# Patient Record
Sex: Male | Born: 1985 | Race: Black or African American | Hispanic: No | Marital: Single | Smoking: Never smoker
Health system: Southern US, Community
[De-identification: ages and names within clinical notes are randomized; demographics above are authoritative.]

## PROBLEM LIST (undated history)

## (undated) ENCOUNTER — Ambulatory Visit (HOSPITAL_COMMUNITY): Payer: Medicaid Other

## (undated) ENCOUNTER — Ambulatory Visit (HOSPITAL_COMMUNITY): Admission: EM | Payer: Self-pay

## (undated) ENCOUNTER — Emergency Department (HOSPITAL_BASED_OUTPATIENT_CLINIC_OR_DEPARTMENT_OTHER): Payer: BC Managed Care – PPO | Source: Home / Self Care

## (undated) DIAGNOSIS — F419 Anxiety disorder, unspecified: Secondary | ICD-10-CM

## (undated) DIAGNOSIS — F32A Depression, unspecified: Secondary | ICD-10-CM

## (undated) DIAGNOSIS — I10 Essential (primary) hypertension: Secondary | ICD-10-CM

## (undated) DIAGNOSIS — B009 Herpesviral infection, unspecified: Secondary | ICD-10-CM

## (undated) DIAGNOSIS — J45909 Unspecified asthma, uncomplicated: Secondary | ICD-10-CM

## (undated) HISTORY — DX: Depression, unspecified: F32.A

## (undated) HISTORY — DX: Anxiety disorder, unspecified: F41.9

## (undated) HISTORY — DX: Unspecified asthma, uncomplicated: J45.909

## (undated) HISTORY — PX: FRACTURE SURGERY: SHX138

---
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
## 2007-04-06 ENCOUNTER — Encounter: Admission: RE | Admit: 2007-04-06 | Discharge: 2007-04-06 | Payer: Self-pay | Admitting: Emergency Medicine

## 2007-05-02 ENCOUNTER — Ambulatory Visit: Payer: Self-pay | Admitting: Gastroenterology

## 2007-05-02 LAB — CONVERTED CEMR LAB
ALT: 19 units/L (ref 0–53)
AST: 19 units/L (ref 0–37)
Albumin: 4.3 g/dL (ref 3.5–5.2)
Alkaline Phosphatase: 70 units/L (ref 39–117)
BUN: 10 mg/dL (ref 6–23)
Basophils Absolute: 0.3 10*3/uL — ABNORMAL HIGH (ref 0.0–0.1)
Basophils Relative: 4.4 % — ABNORMAL HIGH (ref 0.0–1.0)
Bilirubin, Direct: 0.3 mg/dL (ref 0.0–0.3)
CO2: 31 meq/L (ref 19–32)
Calcium: 9.6 mg/dL (ref 8.4–10.5)
Chloride: 104 meq/L (ref 96–112)
Creatinine, Ser: 1 mg/dL (ref 0.4–1.5)
Eosinophils Absolute: 0.2 10*3/uL (ref 0.0–0.6)
Eosinophils Relative: 3.6 % (ref 0.0–5.0)
GFR calc Af Amer: 121 mL/min
GFR calc non Af Amer: 100 mL/min
Glucose, Bld: 84 mg/dL (ref 70–99)
HCT: 48.4 % (ref 39.0–52.0)
Hemoglobin: 15.9 g/dL (ref 13.0–17.0)
Lymphocytes Relative: 26.5 % (ref 12.0–46.0)
MCHC: 32.8 g/dL (ref 30.0–36.0)
MCV: 91.9 fL (ref 78.0–100.0)
Monocytes Absolute: 0.4 10*3/uL (ref 0.2–0.7)
Monocytes Relative: 7.2 % (ref 3.0–11.0)
Neutro Abs: 3.7 10*3/uL (ref 1.4–7.7)
Neutrophils Relative %: 58.3 % (ref 43.0–77.0)
Platelets: 238 10*3/uL (ref 150–400)
Potassium: 4.1 meq/L (ref 3.5–5.1)
RBC: 5.26 M/uL (ref 4.22–5.81)
RDW: 12.6 % (ref 11.5–14.6)
Sodium: 141 meq/L (ref 135–145)
TSH: 1.52 microintl units/mL (ref 0.35–5.50)
Total Bilirubin: 1.5 mg/dL — ABNORMAL HIGH (ref 0.3–1.2)
Total Protein: 7 g/dL (ref 6.0–8.3)
WBC: 6.2 10*3/uL (ref 4.5–10.5)

## 2007-05-25 ENCOUNTER — Ambulatory Visit: Payer: Self-pay | Admitting: Gastroenterology

## 2007-06-22 ENCOUNTER — Telehealth: Payer: Self-pay | Admitting: Gastroenterology

## 2007-06-30 DIAGNOSIS — K5900 Constipation, unspecified: Secondary | ICD-10-CM | POA: Insufficient documentation

## 2007-07-05 ENCOUNTER — Ambulatory Visit: Payer: Self-pay | Admitting: Gastroenterology

## 2008-04-21 ENCOUNTER — Emergency Department (HOSPITAL_COMMUNITY): Admission: EM | Admit: 2008-04-21 | Discharge: 2008-04-21 | Payer: Self-pay | Admitting: Emergency Medicine

## 2008-04-29 ENCOUNTER — Emergency Department (HOSPITAL_COMMUNITY): Admission: EM | Admit: 2008-04-29 | Discharge: 2008-04-29 | Payer: Self-pay | Admitting: Emergency Medicine

## 2008-06-19 ENCOUNTER — Emergency Department (HOSPITAL_COMMUNITY): Admission: EM | Admit: 2008-06-19 | Discharge: 2008-06-19 | Payer: Self-pay | Admitting: Family Medicine

## 2008-12-04 ENCOUNTER — Emergency Department (HOSPITAL_COMMUNITY): Admission: EM | Admit: 2008-12-04 | Discharge: 2008-12-05 | Payer: Self-pay | Admitting: Emergency Medicine

## 2009-08-10 ENCOUNTER — Emergency Department (HOSPITAL_COMMUNITY): Admission: EM | Admit: 2009-08-10 | Discharge: 2009-08-10 | Payer: Self-pay | Admitting: Emergency Medicine

## 2009-08-11 ENCOUNTER — Emergency Department (HOSPITAL_COMMUNITY): Admission: EM | Admit: 2009-08-11 | Discharge: 2009-08-11 | Payer: Self-pay | Admitting: Emergency Medicine

## 2009-12-30 ENCOUNTER — Emergency Department (HOSPITAL_COMMUNITY): Admission: EM | Admit: 2009-12-30 | Discharge: 2009-12-30 | Payer: Self-pay | Admitting: Emergency Medicine

## 2010-05-22 LAB — URINALYSIS, ROUTINE W REFLEX MICROSCOPIC
Bilirubin Urine: NEGATIVE
Glucose, UA: NEGATIVE mg/dL
Hgb urine dipstick: NEGATIVE
Ketones, ur: NEGATIVE mg/dL
Ketones, ur: NEGATIVE mg/dL
Nitrite: NEGATIVE
Nitrite: NEGATIVE
Protein, ur: NEGATIVE mg/dL
Protein, ur: NEGATIVE mg/dL
Specific Gravity, Urine: 1.027 (ref 1.005–1.030)
Urobilinogen, UA: 0.2 mg/dL (ref 0.0–1.0)
pH: 6 (ref 5.0–8.0)
pH: 7.5 (ref 5.0–8.0)

## 2010-05-22 LAB — GLUCOSE, CAPILLARY: Glucose-Capillary: 98 mg/dL (ref 70–99)

## 2010-06-13 ENCOUNTER — Emergency Department (HOSPITAL_COMMUNITY)
Admission: EM | Admit: 2010-06-13 | Discharge: 2010-06-13 | Disposition: A | Discharge status: Home / Self Care | Payer: Self-pay | Attending: Emergency Medicine | Admitting: Emergency Medicine

## 2010-06-13 DIAGNOSIS — R109 Unspecified abdominal pain: Secondary | ICD-10-CM | POA: Insufficient documentation

## 2010-06-13 LAB — POCT I-STAT, CHEM 8
Creatinine, Ser: 1.3 mg/dL (ref 0.4–1.5)
Glucose, Bld: 81 mg/dL (ref 70–99)
Hemoglobin: 16 g/dL (ref 13.0–17.0)
Sodium: 140 mEq/L (ref 135–145)
TCO2: 28 mmol/L (ref 0–100)

## 2010-06-13 LAB — COMPREHENSIVE METABOLIC PANEL
BUN: 14 mg/dL (ref 6–23)
Calcium: 9.4 mg/dL (ref 8.4–10.5)
Glucose, Bld: 81 mg/dL (ref 70–99)
Total Protein: 6.9 g/dL (ref 6.0–8.3)

## 2010-06-24 NOTE — Assessment & Plan Note (Signed)
[facility]                         GASTROENTEROLOGY OFFICE NOTE   Danny Mccoy, Danny Mccoy                      MRN:          [id number]  DATE:05/02/2007                            DOB:          September 19, 1985    REFERRING PHYSICIAN:  Janetta Hora. Hulan Saas, M.D.   REASON FOR CONSULTATION:  Dr. Hulan Saas asked me to evaluate Danny Mccoy in  consultation regarding prior constipation, intermittent rectal bleeding.   HISTORY OF PRESENT ILLNESS:  Danny Mccoy is a very pleasant 25 year old  man who has had approximately 1 year of constipation.  He remembers it  started while he was at work one day.  He had to really push and strain  to move his bowels and he ended up not having a bowel movement that day.  Ever since then he has been really plagued with chronic constipations.  Prior to that he was moving his bowels once or twice a day very easily,  but since then he will move his bowels twice a week at most and really  has to strain a lot in between then.  He describes rather scybalous  stools.  He also describes intermittent rectal bleeding.  He tried a  variety of different regimens including Citrucel, over-the-counter  laxatives, Lactulose, milk of magnesia, mag citrate, none of which  really gave him much relief.  He cannot point to any specific dietary  changes, exercise changes or new medications a year ago that would  account for anything such as this.   REVIEW OF SYSTEMS:  Notable for probably 15 pound weight loss over the  past year and is otherwise essentially normal unless noted on intake  sheet.   PAST MEDICAL HISTORY:  None.   CURRENT MEDICINES:  None.   DRUG ALLERGIES:  NONE.   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  Drinks alcohol rarely.  Does not  drink much caffeine.  Works as a Child psychotherapist.   FAMILY HISTORY:  Hemorrhoids run in his family.  Diabetes runs in the  family.  No  colon cancer.   PHYSICAL EXAMINATION:  VITAL SIGNS:  5 feet 10 inches, 180 pounds.  Blood pressure 116/68.  Pulse 60.  CONSTITUTIONAL:  Generally well appearing.  NEUROLOGIC:  Alert and oriented x3.  HEENT:  Eyes, extraocular movements intact.  Mouth, oropharynx moist, no  lesions.  NECK:  Supple, no lymphadenopathy.  CARDIOVASCULAR:  Heart, regular rate and rhythm.  LUNGS:  Clear to auscultation bilaterally.  ABDOMEN:  Soft, nontender, nondistended, normal bowel sounds.  EXTREMITIES:  No lower extremity edema.  SKIN:  No rashes or lesions on visible extremities.  RECTAL:  Deferred for upcoming colonoscopy.   ASSESSMENT AND PLAN:  A 25 year old man with constipation for  approximately 1 year, intermittent rectal bleeding, mild weight loss.   Given his weight loss and his bleeding, I think we should definitely  proceed with full colonoscopy at his soonest convenience.  Overall, I  think it is unlikely that he has a neoplastic cause of this, but we  should  absolutely rule that out.  He will also get a basic set of labs,  including a CBC, complete metabolic profile and thyroid testing.  I  would like to wait to do the colonoscopy for 2-3 weeks from now, and in  the meantime I would like to get him feeling better with MiraLax.  I  think that he will probably get a lot of relief with 4 scoops of MiraLax  today, 4 tomorrow, and then starting on a daily regimen of 2 scoops  every day.  Other than that I see no reason for any further blood test  for imaging studies.     Rachael Fee, MD  Electronically Signed    DPJ/MedQ  DD: 05/02/2007  DT: 05/02/2007  Job #: [id number]   cc:   Janetta Hora. Hulan Saas, M.D.

## 2010-07-19 ENCOUNTER — Inpatient Hospital Stay (INDEPENDENT_AMBULATORY_CARE_PROVIDER_SITE_OTHER)
Admission: RE | Admit: 2010-07-19 | Discharge: 2010-07-19 | Disposition: A | Discharge status: Home / Self Care | Payer: Self-pay | Source: Ambulatory Visit | Attending: Emergency Medicine | Admitting: Emergency Medicine

## 2010-07-19 DIAGNOSIS — L988 Other specified disorders of the skin and subcutaneous tissue: Secondary | ICD-10-CM

## 2010-07-21 LAB — HERPES SIMPLEX VIRUS CULTURE

## 2011-06-21 ENCOUNTER — Encounter (HOSPITAL_COMMUNITY): Payer: Self-pay | Admitting: *Deleted

## 2011-06-21 ENCOUNTER — Emergency Department (HOSPITAL_COMMUNITY)
Admission: EM | Admit: 2011-06-21 | Discharge: 2011-06-21 | Disposition: A | Discharge status: Home / Self Care | Payer: Self-pay | Attending: Emergency Medicine | Admitting: Emergency Medicine

## 2011-06-21 DIAGNOSIS — L299 Pruritus, unspecified: Secondary | ICD-10-CM | POA: Insufficient documentation

## 2011-06-21 DIAGNOSIS — L309 Dermatitis, unspecified: Secondary | ICD-10-CM

## 2011-06-21 DIAGNOSIS — L509 Urticaria, unspecified: Secondary | ICD-10-CM | POA: Insufficient documentation

## 2011-06-21 DIAGNOSIS — L259 Unspecified contact dermatitis, unspecified cause: Secondary | ICD-10-CM | POA: Insufficient documentation

## 2011-06-21 MED ORDER — DIPHENHYDRAMINE HCL 25 MG PO CAPS
50.0000 mg | ORAL_CAPSULE | Freq: Once | ORAL | Status: AC
  Administered 2011-06-21: 50 mg via ORAL
  Filled 2011-06-21: qty 2

## 2011-06-21 MED ORDER — PREDNISONE 20 MG PO TABS
60.0000 mg | ORAL_TABLET | Freq: Once | ORAL | Status: AC
  Administered 2011-06-21: 60 mg via ORAL
  Filled 2011-06-21: qty 3

## 2011-06-21 MED ORDER — PREDNISONE 10 MG PO TABS: 20.0000 mg | ORAL_TABLET | Freq: Every day | ORAL | Status: DC

## 2011-06-21 NOTE — Discharge Instructions (Signed)
Use adult benadryl as directed Contact Dermatitis Contact dermatitis is a reaction to certain substances that touch the skin. Contact dermatitis can be either irritant contact dermatitis or allergic contact dermatitis. Irritant contact dermatitis does not require previous exposure to the substance for a reaction to occur.Allergic contact dermatitis only occurs if you have been exposed to the substance before. Upon a repeat exposure, your body reacts to the substance.  CAUSES  Many substances can cause contact dermatitis. Irritant dermatitis is most commonly caused by repeated exposure to mildly irritating substances, such as:  Makeup.   Soaps.   Detergents.   Bleaches.   Acids.   Metal salts, such as nickel.  Allergic contact dermatitis is most commonly caused by exposure to:  Poisonous plants.   Chemicals (deodorants, shampoos).   Jewelry.   Latex.   Neomycin in triple antibiotic cream.   Preservatives in products, including clothing.  SYMPTOMS  The area of skin that is exposed may develop:  Dryness or flaking.   Redness.   Cracks.   Itching.   Pain or a burning sensation.   Blisters.  With allergic contact dermatitis, there may also be swelling in areas such as the eyelids, mouth, or genitals.  DIAGNOSIS  Your caregiver can usually tell what the problem is by doing a physical exam. In cases where the cause is uncertain and an allergic contact dermatitis is suspected, a patch skin test may be performed to help determine the cause of your dermatitis. TREATMENT Treatment includes protecting the skin from further contact with the irritating substance by avoiding that substance if possible. Barrier creams, powders, and gloves may be helpful. Your caregiver may also recommend:  Steroid creams or ointments applied 2 times daily. For best results, soak the rash area in cool water for 20 minutes. Then apply the medicine. Cover the area with a plastic wrap. You can store  the steroid cream in the refrigerator for a "chilly" effect on your rash. That may decrease itching. Oral steroid medicines may be needed in more severe cases.   Antibiotics or antibacterial ointments if a skin infection is present.   Antihistamine lotion or an antihistamine taken by mouth to ease itching.   Lubricants to keep moisture in your skin.   Burow's solution to reduce redness and soreness or to dry a weeping rash. Mix one packet or tablet of solution in 2 cups cool water. Dip a clean washcloth in the mixture, wring it out a bit, and put it on the affected area. Leave the cloth in place for 30 minutes. Do this as often as possible throughout the day.   Taking several cornstarch or baking soda baths daily if the area is too large to cover with a washcloth.  Harsh chemicals, such as alkalis or acids, can cause skin damage that is like a burn. You should flush your skin for 15 to 20 minutes with cold water after such an exposure. You should also seek immediate medical care after exposure. Bandages (dressings), antibiotics, and pain medicine may be needed for severely irritated skin.  HOME CARE INSTRUCTIONS  Avoid the substance that caused your reaction.   Keep the area of skin that is affected away from hot water, soap, sunlight, chemicals, acidic substances, or anything else that would irritate your skin.   Do not scratch the rash. Scratching may cause the rash to become infected.   You may take cool baths to help stop the itching.   Only take over-the-counter or prescription medicines as directed  by your caregiver.   See your caregiver for follow-up care as directed to make sure your skin is healing properly.  SEEK MEDICAL CARE IF:   Your condition is not better after 3 days of treatment.   You seem to be getting worse.   You see signs of infection such as swelling, tenderness, redness, soreness, or warmth in the affected area.   You have any problems related to your  medicines.  Document Released: 01/24/2000 Document Revised: 01/15/2011 Document Reviewed: 07/01/2010 [facility] Patient Information 2012 [place], [place].

## 2011-06-21 NOTE — ED Provider Notes (Signed)
History     CSN: [id number]  Arrival date & time 06/21/11  4098   First MD Initiated Contact with Patient 06/21/11 [phone]      Chief Complaint  Patient presents with  . Rash    (Consider location/radiation/quality/duration/timing/severity/associated sxs/prior treatment) Patient is a 26 y.o. male presenting with rash. The history is provided by the patient.  Rash  This is a new problem. The current episode started 12 to 24 hours ago. The problem has been gradually worsening. The problem is associated with nothing. There has been no fever. The rash is present on the face. The patient is experiencing no pain. Associated symptoms include itching. Pertinent negatives include no pain. He has tried antihistamines for the symptoms. The treatment provided no relief.    History reviewed. No pertinent past medical history.  History reviewed. No pertinent past surgical history.  History reviewed. No pertinent family history.  History  Substance Use Topics  . Smoking status: Not on file  . Smokeless tobacco: Not on file  . Alcohol Use: Yes     occ      Review of Systems  Skin: Positive for itching and rash.  All other systems reviewed and are negative.    Allergies  Review of patient's allergies indicates no known allergies.  Home Medications   Current Outpatient Rx  Name Route Sig Dispense Refill  . DIPHENHYDRAMINE HCL 12.5 MG/5ML PO LIQD Oral Take 25 mg by mouth 4 (four) times daily as needed. For itching    . HYDROCORTISONE 1 % EX CREA Topical Apply 1 application topically 2 (two) times daily as needed. For itching      BP 116/70  Pulse 70  Temp(Src) 98.2 F (36.8 C) (Oral)  Resp 20  SpO2 99%  Physical Exam  Nursing note and vitals reviewed. Constitutional: He is oriented to person, place, and time. He appears well-developed and well-nourished.  Non-toxic appearance. No distress.  HENT:  Head: Normocephalic and atraumatic.       Hive like rash to face  Eyes:  Conjunctivae, EOM and lids are normal. Pupils are equal, round, and reactive to light.  Neck: Normal range of motion. Neck supple. No tracheal deviation present. No mass present.  Cardiovascular: Normal rate, regular rhythm and normal heart sounds.  Exam reveals no gallop.   No murmur heard. Pulmonary/Chest: Effort normal and breath sounds normal. No stridor. No respiratory distress. He has no decreased breath sounds. He has no wheezes. He has no rhonchi. He has no rales.  Abdominal: Soft. Normal appearance and bowel sounds are normal. He exhibits no distension. There is no tenderness. There is no rebound and no CVA tenderness.  Musculoskeletal: Normal range of motion. He exhibits no edema and no tenderness.  Neurological: He is alert and oriented to person, place, and time. He has normal strength. No cranial nerve deficit or sensory deficit. GCS eye subscore is 4. GCS verbal subscore is 5. GCS motor subscore is 6.  Skin: Skin is warm and dry. Rash noted. No abrasion noted. Rash is urticarial.  Psychiatric: He has a normal mood and affect. His speech is normal and behavior is normal.    ED Course  Procedures (including critical care time)  Labs Reviewed - No data to display No results found.   No diagnosis found.    MDM  Pt given benadryl and prednisone--suspect contact dermatitis        Toy Baker, MD 06/21/11 [phone]

## 2011-06-21 NOTE — ED Notes (Signed)
Pt reports having rash to face since Friday, causing itching. Airway intact.

## 2011-08-07 ENCOUNTER — Emergency Department (INDEPENDENT_AMBULATORY_CARE_PROVIDER_SITE_OTHER)
Admission: EM | Admit: 2011-08-07 | Discharge: 2011-08-07 | Disposition: A | Discharge status: Home / Self Care | Payer: Self-pay | Source: Home / Self Care | Attending: Family Medicine | Admitting: Family Medicine

## 2011-08-07 ENCOUNTER — Encounter (HOSPITAL_COMMUNITY): Payer: Self-pay

## 2011-08-07 DIAGNOSIS — N342 Other urethritis: Secondary | ICD-10-CM

## 2011-08-07 DIAGNOSIS — J029 Acute pharyngitis, unspecified: Secondary | ICD-10-CM

## 2011-08-07 MED ORDER — LIDOCAINE HCL (PF) 1 % IJ SOLN
INTRAMUSCULAR | Status: AC
  Filled 2011-08-07: qty 5

## 2011-08-07 MED ORDER — AZITHROMYCIN 250 MG PO TABS
1000.0000 mg | ORAL_TABLET | Freq: Once | ORAL | Status: AC
  Administered 2011-08-07: 1000 mg via ORAL

## 2011-08-07 MED ORDER — CEFTRIAXONE SODIUM 1 G IJ SOLR
250.0000 mg | Freq: Once | INTRAMUSCULAR | Status: AC
  Administered 2011-08-07: 250 mg via INTRAMUSCULAR

## 2011-08-07 MED ORDER — AZITHROMYCIN 250 MG PO TABS
ORAL_TABLET | ORAL | Status: AC
  Filled 2011-08-07: qty 4

## 2011-08-07 MED ORDER — CEFTRIAXONE SODIUM 250 MG IJ SOLR
INTRAMUSCULAR | Status: AC
  Filled 2011-08-07: qty 250

## 2011-08-07 NOTE — ED Provider Notes (Signed)
History     CSN: [id number]  Arrival date & time 08/07/11  1128   First MD Initiated Contact with Patient 08/07/11 1203      Chief Complaint  Patient presents with  . SEXUALLY TRANSMITTED DISEASE    (Consider location/radiation/quality/duration/timing/severity/associated sxs/prior treatment) Patient is a 26 y.o. male presenting with STD exposure. The history is provided by the patient.  Exposure to STD This is a new problem. The current episode started more than 1 week ago (urethral d/c). The problem has been gradually worsening.    History reviewed. No pertinent past medical history.  History reviewed. No pertinent past surgical history.  History reviewed. No pertinent family history.  History  Substance Use Topics  . Smoking status: Not on file  . Smokeless tobacco: Not on file  . Alcohol Use: Yes     occ      Review of Systems  Constitutional: Negative.   HENT: Positive for sore throat.   Genitourinary: Positive for discharge. Negative for penile swelling and penile pain.    Allergies  Review of patient's allergies indicates no known allergies.  Home Medications   Current Outpatient Rx  Name Route Sig Dispense Refill  . DIPHENHYDRAMINE HCL 12.5 MG/5ML PO LIQD Oral Take 25 mg by mouth 4 (four) times daily as needed. For itching    . HYDROCORTISONE 1 % EX CREA Topical Apply 1 application topically 2 (two) times daily as needed. For itching    . PREDNISONE 10 MG PO TABS Oral Take 2 tablets (20 mg total) by mouth daily. 10 tablet 0    BP 134/83  Pulse 59  Temp 98.2 F (36.8 C) (Oral)  Resp 12  SpO2 98%  Physical Exam  Nursing note and vitals reviewed. Constitutional: He is oriented to person, place, and time. He appears well-developed and well-nourished.  HENT:  Mouth/Throat: Oropharynx is clear and moist.  Genitourinary: Testes normal and penis normal. Circumcised. No penile tenderness. No discharge found.  Lymphadenopathy:       Right: No inguinal  adenopathy present.       Left: No inguinal adenopathy present.  Neurological: He is alert and oriented to person, place, and time.  Skin: Skin is warm and dry. No rash noted.    ED Course  Procedures (including critical care time)   Labs Reviewed  GC/CHLAMYDIA PROBE AMP, GENITAL   No results found.   1. Urethritis   2. Pharyngitis       MDM          Linna Hoff, MD 08/07/11 1247

## 2011-08-07 NOTE — Discharge Instructions (Signed)
We will call with test results and treat as indicated °

## 2011-08-07 NOTE — ED Notes (Signed)
C/o having urethral d/c for past few days, and ST for past couple of days; REFUSES THROAT SWAB FOR RAPID STREP; c/o "I will die, I cannot do that"; tonsils red, swollen, denies ear pain

## 2011-08-08 LAB — GC/CHLAMYDIA PROBE AMP, GENITAL: GC Probe Amp, Genital: NEGATIVE

## 2011-08-18 ENCOUNTER — Telehealth (HOSPITAL_COMMUNITY): Payer: Self-pay | Admitting: *Deleted

## 2011-08-18 NOTE — ED Notes (Signed)
Pt. called on VM x 2 on 7/8 for lab results. Pt. called again this AM. Pt. verified x 2 and given results. GC/Chlamydia neg.  States we treated him and he got better. He  had unprotected sex with his girlfriend and now has symptoms again.  I instructed to practice safe sex and always wear a condom.  I asked what symptoms. He said tingling in his penis and leaking pus.  I asked him if his girlfriend was checked for Trich.  He said she is not talking to him anymore.  I told him he needed to be rechecked. I told him about the [facility] STD clinic and gave him the address, phone number and hours of operation. Vassie Moselle 08/18/2011

## 2011-08-21 ENCOUNTER — Encounter (HOSPITAL_COMMUNITY): Payer: Self-pay | Admitting: *Deleted

## 2011-08-21 ENCOUNTER — Emergency Department (HOSPITAL_COMMUNITY)
Admission: EM | Admit: 2011-08-21 | Discharge: 2011-08-21 | Disposition: A | Discharge status: Home / Self Care | Payer: Self-pay | Source: Home / Self Care | Attending: Emergency Medicine | Admitting: Emergency Medicine

## 2011-08-21 DIAGNOSIS — R369 Urethral discharge, unspecified: Secondary | ICD-10-CM

## 2011-08-21 LAB — POCT URINALYSIS DIP (DEVICE)
Hgb urine dipstick: NEGATIVE
Protein, ur: NEGATIVE mg/dL
Specific Gravity, Urine: 1.025 (ref 1.005–1.030)
Urobilinogen, UA: 1 mg/dL (ref 0.0–1.0)

## 2011-08-21 MED ORDER — CEFTRIAXONE SODIUM 250 MG IJ SOLR
INTRAMUSCULAR | Status: AC
  Filled 2011-08-21: qty 250

## 2011-08-21 MED ORDER — CEFTRIAXONE SODIUM 250 MG IJ SOLR
250.0000 mg | Freq: Once | INTRAMUSCULAR | Status: AC
  Administered 2011-08-21: 250 mg via INTRAMUSCULAR

## 2011-08-21 MED ORDER — AZITHROMYCIN 250 MG PO TABS
1000.0000 mg | ORAL_TABLET | Freq: Once | ORAL | Status: AC
  Administered 2011-08-21: 1000 mg via ORAL

## 2011-08-21 MED ORDER — AZITHROMYCIN 250 MG PO TABS
ORAL_TABLET | ORAL | Status: AC
  Filled 2011-08-21: qty 4

## 2011-08-21 NOTE — ED Provider Notes (Signed)
History     CSN: [id number]  Arrival date & time 08/21/11  1123   First MD Initiated Contact with Patient 08/21/11 1127      Chief Complaint  Patient presents with  . Penile Discharge    (Consider location/radiation/quality/duration/timing/severity/associated sxs/prior treatment) HPI Comments: Patient presents urgent care complaining of a new lead develop penal discharge and tingling sensations in started Monday. Patient was seen back in June was same symptoms and was treated empirically with DNA probe testing resulting negative. Patient is describing today and needle exposure without protection. Patient describes it in the morning he noticed a minimal amount discharge in his underwear and has some tingling discomfort HEENT sensations when he urinates. Patient denies any fevers, rashes, inguinal pain or swelling. No pelvic pain or rectal pain  Patient is a 26 y.o. male presenting with penile discharge. The history is provided by the patient.  Penile Discharge This is a new problem. The problem has not changed since onset.Pertinent negatives include no chest pain, no headaches and no shortness of breath.    History reviewed. No pertinent past medical history.  History reviewed. No pertinent past surgical history.  No family history on file.  History  Substance Use Topics  . Smoking status: Not on file  . Smokeless tobacco: Not on file  . Alcohol Use: Yes     occ      Review of Systems  Constitutional: Negative for chills, diaphoresis, activity change and appetite change.  Respiratory: Negative for shortness of breath.   Cardiovascular: Negative for chest pain.  Genitourinary: Positive for discharge. Negative for urgency, decreased urine volume, penile swelling, scrotal swelling, genital sores and testicular pain.  Neurological: Negative for dizziness and headaches.    Allergies  Review of patient's allergies indicates no known allergies.  Home Medications   Current  Outpatient Rx  Name Route Sig Dispense Refill  . DIPHENHYDRAMINE HCL 12.5 MG/5ML PO LIQD Oral Take 25 mg by mouth 4 (four) times daily as needed. For itching    . HYDROCORTISONE 1 % EX CREA Topical Apply 1 application topically 2 (two) times daily as needed. For itching    . PREDNISONE 10 MG PO TABS Oral Take 2 tablets (20 mg total) by mouth daily. 10 tablet 0    BP 133/69  Pulse 60  Temp 98.3 F (36.8 C) (Oral)  Resp 16  SpO2 100%  Physical Exam  Nursing note and vitals reviewed. Constitutional: He appears well-developed and well-nourished.  Abdominal: Normal appearance. There is no hepatosplenomegaly. There is no tenderness. There is no CVA tenderness.  Genitourinary: Penis normal.    No phimosis, paraphimosis, hypospadias, penile erythema or penile tenderness. No discharge found.    ED Course  Procedures (including critical care time)  Labs Reviewed  POCT URINALYSIS DIP (DEVICE) - Abnormal; Notable for the following:    Bilirubin Urine SMALL (*)     All other components within normal limits  GC/CHLAMYDIA PROBE AMP, GENITAL   No results found.   1. Penile discharge       MDM  Recurrent penal reports of discharge and tingling sensations. Patient returns with new concerns this started 5 days ago. Reports both penal discharge and tingling sensations. Have obtained another DNA probe for chlamydia and gonorrhea. Previous screening in June was negative for both patient was also treated empirically.        Jimmie Molly, MD 08/21/11 [phone]

## 2011-08-21 NOTE — ED Notes (Signed)
Pt  Was  Seen   Recently  For  Std      According to previous  Entry      - pt  Was  Advised  To  followup  At the  Std  Clinic             He  Apparently       Did  Not     He    Continues  To  Have  Burning on  Urination and  A  Clear  Discharge

## 2011-11-10 ENCOUNTER — Encounter (HOSPITAL_COMMUNITY): Payer: Self-pay | Admitting: *Deleted

## 2011-11-10 ENCOUNTER — Emergency Department (HOSPITAL_COMMUNITY)
Admission: EM | Admit: 2011-11-10 | Discharge: 2011-11-10 | Disposition: A | Discharge status: Home / Self Care | Payer: Self-pay | Attending: Emergency Medicine | Admitting: Emergency Medicine

## 2011-11-10 DIAGNOSIS — K0889 Other specified disorders of teeth and supporting structures: Secondary | ICD-10-CM

## 2011-11-10 DIAGNOSIS — K089 Disorder of teeth and supporting structures, unspecified: Secondary | ICD-10-CM | POA: Insufficient documentation

## 2011-11-10 MED ORDER — HYDROCODONE-ACETAMINOPHEN 5-325 MG PO TABS: 1.0000 | ORAL_TABLET | ORAL | Status: DC | PRN

## 2011-11-10 MED ORDER — IBUPROFEN 200 MG PO TABS
400.0000 mg | ORAL_TABLET | Freq: Once | ORAL | Status: AC
  Administered 2011-11-10: 400 mg via ORAL
  Filled 2011-11-10: qty 2

## 2011-11-10 MED ORDER — PENICILLIN V POTASSIUM 500 MG PO TABS: 500.0000 mg | ORAL_TABLET | Freq: Three times a day (TID) | ORAL | Status: DC

## 2011-11-10 MED ORDER — OXYCODONE-ACETAMINOPHEN 5-325 MG PO TABS
1.0000 | ORAL_TABLET | Freq: Once | ORAL | Status: AC
  Administered 2011-11-10: 1 via ORAL
  Filled 2011-11-10: qty 1

## 2011-11-10 NOTE — ED Notes (Signed)
Verified with Schinliver, PA that the pt was stable & medically cleared to be discharged with admin of Percocet x 3 hrs prior, verbal permission received to discharge pt, pt ambulatory with no complications, pt A&O x 4

## 2011-11-10 NOTE — ED Notes (Signed)
C/o lower L toothache, now developing fever, chills, HA. "tooth feels loose".  sx ongoing for ~ 1 week. Swelling noted to gums. Denies drainage. Took ibuprofen 0900 & tylenol PM last night w/o relief.

## 2011-11-10 NOTE — ED Provider Notes (Signed)
History     CSN: [id number]  Arrival date & time 11/10/11  1931   First MD Initiated Contact with Patient 11/10/11 2149      Chief Complaint  Patient presents with  . Dental Pain    (Consider location/radiation/quality/duration/timing/severity/associated sxs/prior treatment) HPI History provided by pt.   Pt presents w/ severe, non-radiating left lower toothache.  This tooth has been loose for approx one year, but it loosened up approx 1 week ago while eating and had simultaneous onset of pain.  Associated w/ fever.  Denies sore throat, cough, vomiting, diarrhea and urinary sx.  No relief w/ ibuprofen. Does not currently have a dentist.    History reviewed. No pertinent past medical history.  History reviewed. No pertinent past surgical history.  No family history on file.  History  Substance Use Topics  . Smoking status: Not on file  . Smokeless tobacco: Not on file  . Alcohol Use: Yes     occ      Review of Systems  All other systems reviewed and are negative.    Allergies  Review of patient's allergies indicates no known allergies.  Home Medications   Current Outpatient Rx  Name Route Sig Dispense Refill  . ACETAMINOPHEN 500 MG PO TABS Oral Take 500 mg by mouth every 6 (six) hours as needed. For pain    . IBUPROFEN 200 MG PO TABS Oral Take 200 mg by mouth every 6 (six) hours as needed. For pain    . HYDROCODONE-ACETAMINOPHEN 5-325 MG PO TABS Oral Take 1 tablet by mouth every 4 (four) hours as needed for pain. 20 tablet 0  . PENICILLIN V POTASSIUM 500 MG PO TABS Oral Take 1 tablet (500 mg total) by mouth 3 (three) times daily. 30 tablet 0    BP 116/63  Pulse 83  Temp 100.6 F (38.1 C) (Oral)  Resp 20  SpO2 97%  Physical Exam  Nursing note and vitals reviewed. Constitutional: He is oriented to person, place, and time. He appears well-developed and well-nourished.  HENT:  Head: Normocephalic and atraumatic. No trismus in the jaw.  Mouth/Throat: Uvula is  midline and oropharynx is clear and moist.       Left lower 1st molar subluxed.  Ttp.  Adjacent gingiva appears normal.  No edema of buccal mucosa.    Eyes:       Normal appearance  Neck: Normal range of motion. Neck supple.       No submandibular edema  Lymphadenopathy:    He has no cervical adenopathy.  Neurological: He is alert and oriented to person, place, and time.  Skin: He is diaphoretic.  Psychiatric: He has a normal mood and affect. His behavior is normal.    ED Course  Procedures (including critical care time)  Labs Reviewed - No data to display No results found.   1. Pain, dental       MDM  26 yo M presents w/ subluxed and painful left lower 1st molar w/ associated fever.  No other infectious symptoms to explain fever.  Pt received percocet and ibuprofen in triage and was diaphoretic but comfortable appearing on my exam.  WIll recheck temperature but suspect fever has broken.  Pt declined local anesthetic.  D/c'd home w/ vicodin, pencillin and referral to dentist on call as well as several low cost dental clinics in [place] and [place].  Return precautions discussed.   Pt had one percocet 3 hours ago in triage.  We requested that he stay  another hour before driving himself home but he refused.  Nursing staff has ambulated him and he is behaving/speaking/ambulating normally.  I have given the nurse my permission to let the patient leave.      Otilio Miu, PA 11/10/11 2237  Otilio Miu, PA 11/10/11 [phone]

## 2011-11-11 NOTE — ED Provider Notes (Signed)
Medical screening examination/treatment/procedure(s) were performed by non-physician practitioner and as supervising physician I was immediately available for consultation/collaboration.   Carleene Cooper III, MD 11/11/11 [phone]

## 2012-10-25 ENCOUNTER — Encounter (HOSPITAL_COMMUNITY): Payer: Self-pay | Admitting: *Deleted

## 2012-10-25 ENCOUNTER — Emergency Department (INDEPENDENT_AMBULATORY_CARE_PROVIDER_SITE_OTHER)
Admission: EM | Admit: 2012-10-25 | Discharge: 2012-10-25 | Disposition: A | Discharge status: Home / Self Care | Payer: Self-pay | Source: Home / Self Care | Attending: Emergency Medicine | Admitting: Emergency Medicine

## 2012-10-25 DIAGNOSIS — N342 Other urethritis: Secondary | ICD-10-CM

## 2012-10-25 MED ORDER — AZITHROMYCIN 250 MG PO TABS
ORAL_TABLET | ORAL | Status: AC
  Filled 2012-10-25: qty 4

## 2012-10-25 MED ORDER — CEFTRIAXONE SODIUM 250 MG IJ SOLR
250.0000 mg | Freq: Once | INTRAMUSCULAR | Status: AC
  Administered 2012-10-25: 250 mg via INTRAMUSCULAR

## 2012-10-25 MED ORDER — AZITHROMYCIN 250 MG PO TABS
1000.0000 mg | ORAL_TABLET | Freq: Once | ORAL | Status: AC
  Administered 2012-10-25: 1000 mg via ORAL

## 2012-10-25 MED ORDER — CEFTRIAXONE SODIUM 250 MG IJ SOLR
INTRAMUSCULAR | Status: AC
  Filled 2012-10-25: qty 250

## 2012-10-25 NOTE — ED Notes (Signed)
Pt  Reports  Symptoms  Of a  Penile  Discharge           With  Some  Tingling         He  denys    Any  Sores    He  Reports  The  Symptoms   Since  Last         Week

## 2012-10-25 NOTE — ED Provider Notes (Signed)
Chief Complaint:   Chief Complaint  Patient presents with  . Penile Discharge    History of Present Illness:   Danny Mccoy is a-year-old male a one-week history of a clear discharge from his urethra, tingling sensation in the distal urethra, but no dysuria, or pain. He denies any lesions on the penis. The patient had the same symptoms before. He's been tested for STDs on multiple occasions but nothing is ever come back positive. The patient states he had surgery for hypospadias as an infant. He denies any difficulty with his urinary stream. He's had no inguinal lymphadenopathy, testicular tenderness or mass, abdominal pain, fever, chills, skin rash, or joint pain.  Review of Systems:  Other than noted above, the patient denies any of the following symptoms: General:  No fevers, chills, sweats, aches, or fatigue. GI:  No abdominal pain, back pain, nausea, vomiting, diarrhea, or constipation. GU:  No dysuria, frequency, urgency, hematuria, urethral discharge, penile lesions, penile pain, testicular pain, swelling, or mass, inguinal lymphadenopathy or incontinence.  PMFSH:  Past medical history, family history, social history, meds, and allergies were reviewed.    Physical Exam:   Vital signs:  BP 129/79  Pulse 55  Temp(Src) 99.2 F (37.3 C) (Oral)  Resp 14  SpO2 100% Gen:  Alert, oriented, in no distress. Lungs:  Clear to auscultation, no wheezes, rales or rhonchi. Heart:  Regular rhythm, no gallop or murmer. Abdomen:  Flat and soft.  No tenderness to palpation, guarding, or rebound.  No hepato-splenomegaly or mass.  Bowel sounds were normally active.  No hernia. Genital exam:  He is status post hypospadias surgery. There is no urethral discharge. No tenderness to palpation over the urethra. No lesions on the penis. No inguinal lymphadenopathy. Testes were normal. Back:  No CVA tenderness.  Skin:  Clear, warm and dry.   Course in Urgent Care Center:   Urinary DNA probe for gonorrhea,  Chlamydia, Trichomonas were obtained. He was given Rocephin 250 mg IM and azithromycin 1000 mg by mouth.   Assessment: The encounter diagnosis was Urethritis.   I don't think this is an STD. This may be due to thinning of the urethra secondary to his hypospadias surgery. I requested that he followup with urology.   Plan:   1.  The following meds were prescribed:   Discharge Medication List as of 10/25/2012  2:51 PM     2.  The patient was instructed in symptomatic care and handouts were given. 3.  The patient was told to return if becoming worse in any way, if no better in 3 or 4 days, and given some red flag symptoms such as any difficulty urinating or fever that would indicate earlier return. 4.  Follow up with Dr. Sebastian Ache within the next one to 2 weeks.     Reuben Likes, MD 10/25/12 2152

## 2014-02-24 ENCOUNTER — Emergency Department (INDEPENDENT_AMBULATORY_CARE_PROVIDER_SITE_OTHER)
Admission: EM | Admit: 2014-02-24 | Discharge: 2014-02-24 | Disposition: A | Discharge status: Home / Self Care | Payer: Self-pay | Source: Home / Self Care

## 2014-02-24 ENCOUNTER — Encounter (HOSPITAL_COMMUNITY): Payer: Self-pay | Admitting: *Deleted

## 2014-02-24 ENCOUNTER — Other Ambulatory Visit (HOSPITAL_COMMUNITY)
Admission: RE | Admit: 2014-02-24 | Discharge: 2014-02-24 | Disposition: A | Discharge status: Home / Self Care | Payer: Self-pay | Source: Ambulatory Visit | Attending: Emergency Medicine | Admitting: Emergency Medicine

## 2014-02-24 DIAGNOSIS — Z113 Encounter for screening for infections with a predominantly sexual mode of transmission: Secondary | ICD-10-CM | POA: Insufficient documentation

## 2014-02-24 MED ORDER — CEFTRIAXONE SODIUM 250 MG IJ SOLR
250.0000 mg | Freq: Once | INTRAMUSCULAR | Status: AC
  Administered 2014-02-24: 250 mg via INTRAMUSCULAR

## 2014-02-24 MED ORDER — LIDOCAINE HCL (PF) 1 % IJ SOLN
INTRAMUSCULAR | Status: AC
  Filled 2014-02-24: qty 5

## 2014-02-24 MED ORDER — AZITHROMYCIN 250 MG PO TABS
1000.0000 mg | ORAL_TABLET | Freq: Once | ORAL | Status: AC
  Administered 2014-02-24: 1000 mg via ORAL

## 2014-02-24 MED ORDER — AZITHROMYCIN 250 MG PO TABS
ORAL_TABLET | ORAL | Status: AC
  Filled 2014-02-24: qty 4

## 2014-02-24 MED ORDER — CEFTRIAXONE SODIUM 250 MG IJ SOLR
INTRAMUSCULAR | Status: AC
  Filled 2014-02-24: qty 250

## 2014-02-24 NOTE — Discharge Instructions (Signed)
We treated you for gonorrhea and Chlamydia today. No sex for one week. We will call you with the results of your testing. Follow-up as needed.

## 2014-02-24 NOTE — ED Notes (Signed)
STD exposure.  Penile discharge onset 3 days ago.  C/o tingling when he urinates.

## 2014-02-24 NOTE — ED Provider Notes (Signed)
CSN: [id number]     Arrival date & time 02/24/14  1853 History   None    No chief complaint on file.  (Consider location/radiation/quality/duration/timing/severity/associated sxs/prior Treatment) HPI  He is a 29 year old man here for evaluation of penile discharge. He states he has had discharge for the last 3 days. He reports some tingling with urination. No fevers or chills. No genital lesions. He did have a new sexual partner a week or so ago.  No past medical history on file. No past surgical history on file. No family history on file. History  Substance Use Topics  . Smoking status: Never Smoker   . Smokeless tobacco: Not on file  . Alcohol Use: Yes     Comment: occ    Review of Systems As in history of present illness Allergies  Review of patient's allergies indicates no known allergies.  Home Medications   Prior to Admission medications   Medication Sig Start Date End Date Taking? Authorizing Provider  acetaminophen (TYLENOL) 500 MG tablet Take 500 mg by mouth every 6 (six) hours as needed. For pain    Historical Provider, MD  HYDROcodone-acetaminophen (NORCO/VICODIN) 5-325 MG per tablet Take 1 tablet by mouth every 4 (four) hours as needed for pain. 11/10/11   Catherine E Schinlever, PA-C  ibuprofen (ADVIL,MOTRIN) 200 MG tablet Take 200 mg by mouth every 6 (six) hours as needed. For pain    Historical Provider, MD  penicillin v potassium (VEETID) 500 MG tablet Take 1 tablet (500 mg total) by mouth 3 (three) times daily. 11/10/11   Arie Sabinaatherine E Schinlever, PA-C   BP 119/82 mmHg  Pulse 55  Temp(Src) 98.6 F (37 C) (Oral)  Resp 12  SpO2 97% Physical Exam  Constitutional: He is oriented to person, place, and time. He appears well-developed and well-nourished. No distress.  Cardiovascular: Normal rate.   Pulmonary/Chest: Effort normal.  Neurological: He is alert and oriented to person, place, and time.    ED Course  Procedures (including critical care time) Labs  Review Labs Reviewed  URINE CYTOLOGY ANCILLARY ONLY    Imaging Review No results found.   MDM   1. Screen for STD (sexually transmitted disease)    Patient declined testing for HIV and syphilis. We'll treat presumptively with Rocephin 250 mg IM and azithromycin 1 g by mouth. Follow-up as needed.    Charm RingsErin J Seferina Brokaw, MD 02/24/14 [phone]

## 2014-02-26 LAB — URINE CYTOLOGY ANCILLARY ONLY
CHLAMYDIA, DNA PROBE: NEGATIVE
NEISSERIA GONORRHEA: NEGATIVE
Trichomonas: NEGATIVE

## 2014-07-13 ENCOUNTER — Encounter: Payer: Self-pay | Admitting: Gastroenterology

## 2014-08-09 ENCOUNTER — Emergency Department (HOSPITAL_COMMUNITY)
Admission: EM | Admit: 2014-08-09 | Discharge: 2014-08-09 | Disposition: A | Discharge status: Home / Self Care | Payer: Self-pay | Source: Home / Self Care | Attending: Family Medicine | Admitting: Family Medicine

## 2014-08-09 ENCOUNTER — Encounter (HOSPITAL_COMMUNITY): Payer: Self-pay | Admitting: *Deleted

## 2014-08-09 DIAGNOSIS — M792 Neuralgia and neuritis, unspecified: Secondary | ICD-10-CM

## 2014-08-09 DIAGNOSIS — R51 Headache: Secondary | ICD-10-CM

## 2014-08-09 DIAGNOSIS — R519 Headache, unspecified: Secondary | ICD-10-CM

## 2014-08-09 MED ORDER — PREDNISONE 20 MG PO TABS: ORAL_TABLET | ORAL | Status: DC

## 2014-08-09 MED ORDER — AMOXICILLIN 500 MG PO CAPS: 1000.0000 mg | ORAL_CAPSULE | Freq: Two times a day (BID) | ORAL | Status: DC

## 2014-08-09 MED ORDER — TRAMADOL HCL 50 MG PO TABS: 50.0000 mg | ORAL_TABLET | Freq: Four times a day (QID) | ORAL | Status: DC | PRN

## 2014-08-09 NOTE — ED Provider Notes (Signed)
CSN: [id number]     Arrival date & time 08/09/14  1544 History   None    Chief Complaint  Patient presents with  . Facial Pain   (Consider location/radiation/quality/duration/timing/severity/associated sxs/prior Treatment) HPI Comments: 29 year old male presents with a four-day history of pain to the right temple and right maxilla. He states there is discomfort to the upper right teeth but tickly when he is drinking cold water. He states his  right upper teeth are sensitive. is also had a headache primarily in the temple area it is worse when supine. Also complaining of mild photophobia. He states ibuprofen helps and often abates the pain. Denies problems with vision, speech, hearing, swallowing.    History reviewed. No pertinent past medical history. History reviewed. No pertinent past surgical history. No family history on file. History  Substance Use Topics  . Smoking status: Never Smoker   . Smokeless tobacco: Not on file  . Alcohol Use: Yes     Comment: occ    Review of Systems  Constitutional: Negative for fever, activity change and fatigue.  HENT: Negative for ear discharge, ear pain, postnasal drip, rhinorrhea and sore throat.   Eyes: Positive for photophobia. Negative for visual disturbance.  Respiratory: Negative.   Musculoskeletal: Negative.   Skin: Negative.   Neurological: Positive for headaches. Negative for dizziness, tremors, seizures, syncope, facial asymmetry, speech difficulty and numbness.  Psychiatric/Behavioral: Negative.     Allergies  Review of patient's allergies indicates no known allergies.  Home Medications   Prior to Admission medications   Medication Sig Start Date End Date Taking? Authorizing Provider  predniSONE (DELTASONE) 20 MG tablet 3 Tabs PO Days 1-3, then 2 tabs PO Days 4-6, then 1 tab PO Day 7-9, then Half Tab PO Day 10-12 08/09/14   Hayden Rasmussenavid Kaleah Hagemeister, NP  traMADol (ULTRAM) 50 MG tablet Take 1 tablet (50 mg total) by mouth every 6 (six) hours as  needed. 08/09/14   Hayden Rasmussenavid Darilyn Storbeck, NP   BP 126/78 mmHg  Pulse 60  Temp(Src) 97.9 F (36.6 C) (Oral)  Resp 18  SpO2 99% Physical Exam  Constitutional: He appears well-developed and well-nourished. No distress.  HENT:  Bilateral TMs are normal. No erythema or effusion. No bulging. EACs are clear. No local tenderness. No pre-or postauricular tenderness. There is tenderness to the right upper alveolar ridge. No pterygoid muscle tenderness. Mild tenderness to the right temple. Normal jaw movement and range of motion.  Eyes: Conjunctivae and EOM are normal.  Neck: Normal range of motion. Neck supple.  Cardiovascular: Normal rate, regular rhythm and normal heart sounds.   Pulmonary/Chest: Effort normal and breath sounds normal. No respiratory distress. He has no wheezes.  Musculoskeletal: He exhibits no edema.  Lymphadenopathy:    He has no cervical adenopathy.  Neurological: He is alert. He exhibits normal muscle tone.  Skin: Skin is warm and dry.  Nursing note and vitals reviewed.   ED Course  Procedures (including critical care time) Labs Review Labs Reviewed - No data to display  Imaging Review No results found.   MDM   1. Craniofacial pain   2. Neuralgia    Dif includes dental etiology, local inflammation CN 5 involvement.  Prednisone taper Tramadol 50 mg #15 Cont motrin 400 mg q 6h prn AMoxicillin to cover possible occult infection of the tooth. See dentist as needed     Hayden RasmussenDavid Davisha Linthicum, NP 08/09/14 1712

## 2014-08-09 NOTE — Discharge Instructions (Signed)

## 2014-08-09 NOTE — ED Notes (Signed)
Pt reports  r  Sided  Facial  Pain   With  Photosensitivity             With  Symptoms    Since   Friday        Pt  Reports symptoms  Not  releived  By otc meds         Pain is  Worse  When  She  Lays  Down

## 2014-10-12 ENCOUNTER — Encounter (HOSPITAL_COMMUNITY): Payer: Self-pay | Admitting: *Deleted

## 2014-10-12 ENCOUNTER — Other Ambulatory Visit (HOSPITAL_COMMUNITY)
Admission: RE | Admit: 2014-10-12 | Discharge: 2014-10-12 | Disposition: A | Discharge status: Home / Self Care | Payer: Self-pay | Source: Ambulatory Visit | Attending: Family Medicine | Admitting: Family Medicine

## 2014-10-12 ENCOUNTER — Emergency Department (INDEPENDENT_AMBULATORY_CARE_PROVIDER_SITE_OTHER)
Admission: EM | Admit: 2014-10-12 | Discharge: 2014-10-12 | Disposition: A | Discharge status: Home / Self Care | Payer: Self-pay | Source: Home / Self Care | Attending: Family Medicine | Admitting: Family Medicine

## 2014-10-12 DIAGNOSIS — Z113 Encounter for screening for infections with a predominantly sexual mode of transmission: Secondary | ICD-10-CM | POA: Insufficient documentation

## 2014-10-12 DIAGNOSIS — Z711 Person with feared health complaint in whom no diagnosis is made: Secondary | ICD-10-CM

## 2014-10-12 DIAGNOSIS — Z202 Contact with and (suspected) exposure to infections with a predominantly sexual mode of transmission: Secondary | ICD-10-CM

## 2014-10-12 MED ORDER — AZITHROMYCIN 250 MG PO TABS
1000.0000 mg | ORAL_TABLET | Freq: Once | ORAL | Status: AC
  Administered 2014-10-12: 1000 mg via ORAL

## 2014-10-12 MED ORDER — AZITHROMYCIN 250 MG PO TABS
ORAL_TABLET | ORAL | Status: AC
  Filled 2014-10-12: qty 4

## 2014-10-12 MED ORDER — CEFTRIAXONE SODIUM 250 MG IJ SOLR
INTRAMUSCULAR | Status: AC
  Filled 2014-10-12: qty 250

## 2014-10-12 MED ORDER — CEFTRIAXONE SODIUM 250 MG IJ SOLR
250.0000 mg | Freq: Once | INTRAMUSCULAR | Status: AC
  Administered 2014-10-12: 250 mg via INTRAMUSCULAR

## 2014-10-12 NOTE — Discharge Instructions (Signed)
We will call with positive test results and treat as indicated  °

## 2014-10-12 NOTE — ED Provider Notes (Signed)
CSN: [id number]     Arrival date & time 10/12/14  1939 History   First MD Initiated Contact with Patient 10/12/14 2023     No chief complaint on file.  (Consider location/radiation/quality/duration/timing/severity/associated sxs/prior Treatment) Patient is a 29 y.o. male presenting with STD exposure. The history is provided by the patient.  Exposure to STD This is a new problem. The current episode started more than 2 days ago (no condom and had sex resulting in white urethral d/c.). The problem has not changed since onset.Pertinent negatives include no chest pain, no abdominal pain and no headaches.    No past medical history on file. No past surgical history on file. No family history on file. Social History  Substance Use Topics  . Smoking status: Never Smoker   . Smokeless tobacco: Not on file  . Alcohol Use: Yes     Comment: occ    Review of Systems  Cardiovascular: Negative for chest pain.  Gastrointestinal: Negative.  Negative for abdominal pain.  Genitourinary: Positive for discharge. Negative for dysuria, urgency, penile swelling, scrotal swelling, genital sores, penile pain and testicular pain.  Neurological: Negative for headaches.  Hematological: Negative for adenopathy.  All other systems reviewed and are negative.   Allergies  Review of patient's allergies indicates no known allergies.  Home Medications   Prior to Admission medications   Medication Sig Start Date End Date Taking? Authorizing Provider  amoxicillin (AMOXIL) 500 MG capsule Take 2 capsules (1,000 mg total) by mouth 2 (two) times daily. 08/09/14   Hayden Rasmussen, NP  predniSONE (DELTASONE) 20 MG tablet 3 Tabs PO Days 1-3, then 2 tabs PO Days 4-6, then 1 tab PO Day 7-9, then Half Tab PO Day 10-12 08/09/14   Hayden Rasmussen, NP  traMADol (ULTRAM) 50 MG tablet Take 1 tablet (50 mg total) by mouth every 6 (six) hours as needed. 08/09/14   Hayden Rasmussen, NP   Meds Ordered and Administered this Visit   Medications   cefTRIAXone (ROCEPHIN) injection 250 mg (not administered)  azithromycin (ZITHROMAX) tablet 1,000 mg (not administered)    BP 106/72 mmHg  Pulse 57  Temp(Src) 98 F (36.7 C) (Oral)  Resp 12  SpO2 99% No data found.   Physical Exam  Constitutional: He is oriented to person, place, and time. He appears well-developed and well-nourished. No distress.  Neck: Normal range of motion. Neck supple.  Abdominal: Soft. Bowel sounds are normal. He exhibits no distension. There is no tenderness.  Genitourinary: Penis normal.  Neurological: He is alert and oriented to person, place, and time.  Skin: Skin is warm and dry.  Nursing note and vitals reviewed.   ED Course  Procedures (including critical care time)  Labs Review Labs Reviewed  CYTOLOGY, (ORAL, ANAL, URETHRAL) ANCILLARY ONLY    Imaging Review No results found.   Visual Acuity Review  Right Eye Distance:   Left Eye Distance:   Bilateral Distance:    Right Eye Near:   Left Eye Near:    Bilateral Near:         MDM   1. Concern about STD in male without diagnosis    Treatment given for std    Linna Hoff, MD 10/12/14 2037

## 2014-10-12 NOTE — ED Notes (Signed)
Pt  Has  A  Penile  Discharge         X    4  Days           Pt     denys  Any   Other  Symptoms

## 2014-10-16 LAB — CYTOLOGY, (ORAL, ANAL, URETHRAL) ANCILLARY ONLY
Chlamydia: NEGATIVE
Neisseria Gonorrhea: NEGATIVE

## 2014-10-23 NOTE — ED Notes (Signed)
Final report of STD screening negative 

## 2015-03-07 ENCOUNTER — Emergency Department (INDEPENDENT_AMBULATORY_CARE_PROVIDER_SITE_OTHER)
Admission: EM | Admit: 2015-03-07 | Discharge: 2015-03-07 | Disposition: A | Discharge status: Home / Self Care | Payer: Self-pay | Source: Home / Self Care

## 2015-03-07 ENCOUNTER — Encounter (HOSPITAL_COMMUNITY): Payer: Self-pay | Admitting: *Deleted

## 2015-03-07 ENCOUNTER — Other Ambulatory Visit (HOSPITAL_COMMUNITY)
Admission: RE | Admit: 2015-03-07 | Discharge: 2015-03-07 | Disposition: A | Discharge status: Home / Self Care | Payer: Self-pay | Source: Ambulatory Visit | Attending: Family Medicine | Admitting: Family Medicine

## 2015-03-07 DIAGNOSIS — Z202 Contact with and (suspected) exposure to infections with a predominantly sexual mode of transmission: Secondary | ICD-10-CM

## 2015-03-07 DIAGNOSIS — Z711 Person with feared health complaint in whom no diagnosis is made: Secondary | ICD-10-CM

## 2015-03-07 DIAGNOSIS — Z113 Encounter for screening for infections with a predominantly sexual mode of transmission: Secondary | ICD-10-CM | POA: Insufficient documentation

## 2015-03-07 MED ORDER — AZITHROMYCIN 250 MG PO TABS
ORAL_TABLET | ORAL | Status: AC
  Filled 2015-03-07: qty 4

## 2015-03-07 MED ORDER — CEFTRIAXONE SODIUM 250 MG IJ SOLR
250.0000 mg | Freq: Once | INTRAMUSCULAR | Status: AC
  Administered 2015-03-07: 250 mg via INTRAMUSCULAR

## 2015-03-07 MED ORDER — CEFTRIAXONE SODIUM 1 G IJ SOLR
INTRAMUSCULAR | Status: AC
  Filled 2015-03-07: qty 10

## 2015-03-07 MED ORDER — AZITHROMYCIN 250 MG PO TABS
1000.0000 mg | ORAL_TABLET | Freq: Once | ORAL | Status: AC
  Administered 2015-03-07: 1000 mg via ORAL

## 2015-03-07 NOTE — Discharge Instructions (Signed)
We will call with positive test results and treat as indicated  °

## 2015-03-07 NOTE — ED Notes (Signed)
Pt    Reports  Penile  Discharge          X  sev  Weeks          Pt       denys  Any  Other  Symptoms

## 2015-03-07 NOTE — ED Provider Notes (Addendum)
CSN: [id number]     Arrival date & time 03/07/15  1939 History   None    Chief Complaint  Patient presents with  . SEXUALLY TRANSMITTED DISEASE   (Consider location/radiation/quality/duration/timing/severity/associated sxs/prior Treatment) Patient is a 30 y.o. male presenting with STD exposure. The history is provided by the patient.  Exposure to STD This is a new problem. The current episode started more than 1 week ago (2 wks ago of penile d/c, no lesions, unprotected sex with girlfriend.). The problem has not changed (has not been able to come for eval and rx ) since onset.Pertinent negatives include no chest pain and no abdominal pain.    History reviewed. No pertinent past medical history. History reviewed. No pertinent past surgical history. History reviewed. No pertinent family history. Social History  Substance Use Topics  . Smoking status: Never Smoker   . Smokeless tobacco: None  . Alcohol Use: Yes     Comment: occ    Review of Systems  Cardiovascular: Negative for chest pain.  Gastrointestinal: Negative for abdominal pain.  Genitourinary: Positive for discharge. Negative for dysuria, penile swelling, scrotal swelling, genital sores, penile pain and testicular pain.  All other systems reviewed and are negative.   Allergies  Review of patient's allergies indicates no known allergies.  Home Medications   Prior to Admission medications   Medication Sig Start Date End Date Taking? Authorizing Provider  amoxicillin (AMOXIL) 500 MG capsule Take 2 capsules (1,000 mg total) by mouth 2 (two) times daily. 08/09/14   Hayden Rasmussen, NP  predniSONE (DELTASONE) 20 MG tablet 3 Tabs PO Days 1-3, then 2 tabs PO Days 4-6, then 1 tab PO Day 7-9, then Half Tab PO Day 10-12 08/09/14   Hayden Rasmussen, NP  traMADol (ULTRAM) 50 MG tablet Take 1 tablet (50 mg total) by mouth every 6 (six) hours as needed. 08/09/14   Hayden Rasmussen, NP   Meds Ordered and Administered this Visit   Medications   cefTRIAXone (ROCEPHIN) injection 250 mg (250 mg Intramuscular Given 03/07/15 2041)  azithromycin (ZITHROMAX) tablet 1,000 mg (1,000 mg Oral Given 03/07/15 2041)    BP 120/64 mmHg  Pulse 78  Temp(Src) 98.6 F (37 C) (Oral)  Resp 16  SpO2 100% No data found.   Physical Exam  Constitutional: He is oriented to person, place, and time. He appears well-developed and well-nourished. No distress.  Genitourinary: Testes normal. Circumcised. Hypospadias present. No penile tenderness.  Musculoskeletal: Normal range of motion.  Lymphadenopathy:       Right: No inguinal adenopathy present.       Left: No inguinal adenopathy present.  Neurological: He is alert and oriented to person, place, and time.  Skin: Skin is warm and dry.  Nursing note and vitals reviewed.   ED Course  Procedures (including critical care time)  Labs Review Labs Reviewed  HIV ANTIBODY (ROUTINE TESTING)  RPR  CYTOLOGY, (ORAL, ANAL, URETHRAL) ANCILLARY ONLY    Imaging Review No results found.   Visual Acuity Review  Right Eye Distance:   Left Eye Distance:   Bilateral Distance:    Right Eye Near:   Left Eye Near:    Bilateral Near:         MDM   1. Concern about STD in male without diagnosis    Meds ordered this encounter  Medications  . cefTRIAXone (ROCEPHIN) injection 250 mg    Sig:   . azithromycin (ZITHROMAX) tablet 1,000 mg    Sig:  Linna Hoff, MD 03/07/15 9147  Linna Hoff, MD 03/07/15 2045

## 2015-03-07 NOTE — ED Notes (Signed)
Patient refused the blood draw for the RPR & HIV

## 2015-03-08 LAB — CYTOLOGY, (ORAL, ANAL, URETHRAL) ANCILLARY ONLY
Chlamydia: NEGATIVE
NEISSERIA GONORRHEA: NEGATIVE

## 2015-04-01 ENCOUNTER — Encounter (HOSPITAL_COMMUNITY): Payer: Self-pay

## 2015-04-01 ENCOUNTER — Emergency Department (HOSPITAL_COMMUNITY)
Admission: EM | Admit: 2015-04-01 | Discharge: 2015-04-01 | Disposition: A | Discharge status: Home / Self Care | Payer: Self-pay | Attending: Emergency Medicine | Admitting: Emergency Medicine

## 2015-04-01 DIAGNOSIS — Z202 Contact with and (suspected) exposure to infections with a predominantly sexual mode of transmission: Secondary | ICD-10-CM | POA: Insufficient documentation

## 2015-04-01 DIAGNOSIS — Z792 Long term (current) use of antibiotics: Secondary | ICD-10-CM | POA: Insufficient documentation

## 2015-04-01 LAB — URINALYSIS, ROUTINE W REFLEX MICROSCOPIC
Glucose, UA: NEGATIVE mg/dL
Hgb urine dipstick: NEGATIVE
KETONES UR: NEGATIVE mg/dL
Leukocytes, UA: NEGATIVE
NITRITE: NEGATIVE
PROTEIN: NEGATIVE mg/dL
Specific Gravity, Urine: 1.033 — ABNORMAL HIGH (ref 1.005–1.030)
pH: 5.5 (ref 5.0–8.0)

## 2015-04-01 MED ORDER — CEFTRIAXONE SODIUM 250 MG IJ SOLR
250.0000 mg | Freq: Once | INTRAMUSCULAR | Status: AC
  Administered 2015-04-01: 250 mg via INTRAMUSCULAR
  Filled 2015-04-01: qty 250

## 2015-04-01 MED ORDER — STERILE WATER FOR INJECTION IJ SOLN
10.0000 mL | Freq: Once | INTRAMUSCULAR | Status: AC
  Administered 2015-04-01: 10 mL via INTRAMUSCULAR
  Filled 2015-04-01: qty 10

## 2015-04-01 MED ORDER — AZITHROMYCIN 250 MG PO TABS
1000.0000 mg | ORAL_TABLET | Freq: Once | ORAL | Status: AC
  Administered 2015-04-01: 1000 mg via ORAL
  Filled 2015-04-01: qty 4

## 2015-04-01 NOTE — ED Notes (Signed)
Patient here with painless penile discharge x 1 week, no other complaints

## 2015-04-01 NOTE — ED Provider Notes (Signed)
CSN: [id number]     Arrival date & time 04/01/15  0830 History  By signing my name below, I, Danny Mccoy, attest that this documentation has been prepared under the direction and in the presence of [facility], PA-C. Electronically Signed: Ronney Mccoy, ED Scribe. 04/01/2015. 4:34 PM.    Chief Complaint  Patient presents with  . penile discharge    The history is provided by the patient. No language interpreter was used.    HPI Comments: Danny Mccoy is a 30 y.o. male who presents to the Emergency Department complaining of constant, unchanged, clear penile discharge that began 1 week ago. He reports having unprotected sexual intercourse that occurred 2 weeks ago. He states he has tried no treatments or medications for his symptoms. He denies penile pain, redness, swelling, abdominal pain, vomiting, fever, or dysuria.  History reviewed. No pertinent past medical history. History reviewed. No pertinent past surgical history. No family history on file. Social History  Substance Use Topics  . Smoking status: Never Smoker   . Smokeless tobacco: None  . Alcohol Use: Yes     Comment: occ    Review of Systems  Constitutional: Negative for fever.  Gastrointestinal: Negative for vomiting and abdominal pain.  Genitourinary: Positive for discharge. Negative for dysuria, penile swelling, scrotal swelling, penile pain and testicular pain.  Skin: Negative for color change.   Allergies  Review of patient's allergies indicates no known allergies.  Home Medications   Prior to Admission medications   Medication Sig Start Date End Date Taking? Authorizing Provider  amoxicillin (AMOXIL) 500 MG capsule Take 2 capsules (1,000 mg total) by mouth 2 (two) times daily. 08/09/14   Danny Rasmussen, NP  predniSONE (DELTASONE) 20 MG tablet 3 Tabs PO Days 1-3, then 2 tabs PO Days 4-6, then 1 tab PO Day 7-9, then Half Tab PO Day 10-12 08/09/14   Danny Rasmussen, NP  traMADol (ULTRAM) 50 MG tablet Take 1 tablet (50 mg total)  by mouth every 6 (six) hours as needed. 08/09/14   Danny Rasmussen, NP   BP 128/80 mmHg  Pulse 77  Temp(Src) 98.3 F (36.8 C) (Oral)  Resp 16  SpO2 100% Physical Exam  Constitutional: He is oriented to person, place, and time. He appears well-developed and well-nourished. No distress.  HENT:  Head: Normocephalic and atraumatic.  Cardiovascular: Normal rate, regular rhythm and normal heart sounds.   No murmur heard. Pulmonary/Chest: Effort normal and breath sounds normal. No respiratory distress. He has no wheezes.  Abdominal: Soft. Bowel sounds are normal. He exhibits no distension. There is no tenderness.  Genitourinary:  Chaperone present for exam No signs of lesion or erythema on the penis or testicles. The penis and testicles are nontender. No testicular masses or swelling. No signs of any inguinal hernias. No signs of any discharge from the penis. Cremaster reflex present bilaterally.  Musculoskeletal: Normal range of motion.  Neurological: He is alert and oriented to person, place, and time.  Skin: Skin is warm and dry. No rash noted.  Psychiatric: He has a normal mood and affect. His behavior is normal.  Nursing note and vitals reviewed.   ED Course  Procedures (including critical care time)  DIAGNOSTIC STUDIES: Oxygen Saturation is 98% on RA, normal by my interpretation.    COORDINATION OF CARE: 11:13 AM - Discussed treatment plan with pt at bedside which includes UA to test for GC/Chlamydia. Pt verbalized understanding and agreed to plan.   MDM   Final diagnoses:  Exposure to  STD  Danny Mccoy presents for painless clear penile discharge and hx of recent unprotected intercourse.  UA reassuring. Discussed importance of using protection when sexually active. Pt understands that they have GC/Chlamydia cultures pending and that they will need to inform all sexual partners if results return positive. Pt has been treated prophylacticly with azithromycin and rocephin due to pts  history. Offered HIV/syphilis testing to pt, but he declined, stating, "I already had that done."   I personally performed the services described in this documentation, which was scribed in my presence. The recorded information has been reviewed and is accurate.   [facility] Danny Arcand, PA-C 04/01/15 1636  Danny Nick, MD 04/02/15 [phone]

## 2015-04-01 NOTE — ED Notes (Signed)
Declined W/C at D/C and was escorted to lobby by RN. 

## 2015-04-01 NOTE — Discharge Instructions (Signed)
1. Medications: usual home medications 2. Treatment: rest, drink plenty of fluids, use a condom with every sexual encounter 3. Follow Up: Please follow up with your primary doctor in 3 days for discussion of your diagnoses and further evaluation after today's visit; if you do not have a primary care doctor use the resource guide provided to find one; Please return to the ER for worsening symptoms, high fevers or persistent vomiting.  You have been tested for chlamydia and gonorrhea. These results will be available in approximately 3 days. You will be notified if they are positive.   It is very important to practice safe sex and use condoms when sexually active. If your results are positive you need to notify all sexual partners so they can be treated as well. The website https://garcia.net/ can be used to send anonymous text messages or emails to alert sexual contacts.   SEEK IMMEDIATE MEDICAL CARE IF:  You develop an oral temperature above 102 F (38.9 C), not controlled by medications or lasting more than 2 days.  You develop an increase in pain.    Emergency Department Resource Guide  1) Find a Doctor and Pay Out of Pocket Although you won't have to find out who is covered by your insurance plan, it is a good idea to ask around and get recommendations. You will then need to call the office and see if the doctor you have chosen will accept you as a new patient and what types of options they offer for patients who are self-pay. Some doctors offer discounts or will set up payment plans for their patients who do not have insurance, but you will need to ask so you aren't surprised when you get to your appointment.  2) Contact Your Local Health Department Not all health departments have doctors that can see patients for sick visits, but many do, so it is worth a call to see if yours does. If you don't know where your local health department is, you can check in your phone book. The CDC also  has a tool to help you locate your state's health department, and many state websites also have listings of all of their local health departments.  3) Find a Walk-in Clinic If your illness is not likely to be very severe or complicated, you may want to try a walk in clinic. These are popping up all over the country in pharmacies, drugstores, and shopping centers. They're usually staffed by nurse practitioners or physician assistants that have been trained to treat common illnesses and complaints. They're usually fairly quick and inexpensive. However, if you have serious medical issues or chronic medical problems, these are probably not your best option.  No Primary Care Doctor: - Call Health Connect at  [phone]: they can help you locate a primary care doctor that  accepts your insurance, provides certain services, etc. - Physician Referral Service: [phone]  Chronic Pain Problems: Organization         Address  Phone   Notes  [facility]  [phone] Patients need to be referred by their primary care doctor.   Medication Assistance: Organization         Address  Phone   Notes  [facility] [address] [place] [place], [place] [address] [phone] --Must be a resident of [facility] -- Must have NO insurance coverage whatsoever (no Medicaid/ Medicare, etc.) -- The pt. MUST have a primary care doctor that directs their  care regularly and follows them in the community   [facility]  [phone]   [employer]  [phone]    Agencies that provide inexpensive medical care: Organization         Address  Phone   Notes  [facility]  [phone]   [facility]    [phone]   [facility] [address] [place], [place] [address] [phone]   [facility] [address] [place] [phone]   [facility]    [phone]    [facility]    [phone]   [facility]  [address], [place] Phone:  [phone], Fax:  [phone] Hours of Operation:  9 am - 6 pm, M-F.  Also accepts Medicaid/Medicare and self-pay.  [facility]  [address], [place] Phone: [phone], Fax: [phone]. Hours of Operation:  8:30 am - 5:30 pm, M-F.  Also accepts Medicaid and self-pay.  [facility] [place] [address] [place] Phone: [phone]   [facility] [address] Natasha Bence [place], [place] [phone], Ext. [phone] Mondays & Thursdays: 7-9 AM.  First 15 patients are seen on a first come, first serve basis.    Medicaid-accepting [facility] Providers:  Organization         Address  Phone   Notes  [facility] [address], [place] [phone] Also accepts self-pay patients.  [facility] [address] Laurell Josephs [place], [place]  [phone]   [facility] [address], [place] [phone]   [facility] [address] [place] [phone]   Renaye Rakers [address], [place]   [phone] Only accepts [place] patients after they have their name applied to their card.   Self-Pay (no insurance) in [facility]:  Organization         Address  Phone   Notes  Sickle Cell Patients, [facility] [address] [place], [place] [phone]   [facility] [address] [place], [place] [phone]   [facility] [place]  [address], [place] [phone]   [facility]/Dr. Osei-Bonsu  [address], [place] or [address], [place] [phone] Phone number for both [place] and [place] locations is the same.  [facility] [address], [place] [phone]   [facility] [address], [place] or [address] [phone]   [facility] [address] [place] [phone], phone; [phone], fax Sees patients 1st and 3rd Saturday of every month.  Must not qualify for public or private insurance (i.e. Medicaid, Medicare, [place] [employer] Choice, Veterans' Benefits)  Household income should be no more than 200% of the poverty level The clinic cannot treat you if you are pregnant or think you are pregnant  Sexually transmitted diseases are not treated at the clinic.

## 2015-04-02 LAB — GC/CHLAMYDIA PROBE AMP (~~LOC~~) NOT AT ARMC
Chlamydia: NEGATIVE
Neisseria Gonorrhea: NEGATIVE

## 2015-04-02 LAB — URINE CULTURE: CULTURE: NO GROWTH

## 2015-12-06 ENCOUNTER — Emergency Department (HOSPITAL_COMMUNITY)
Admission: EM | Admit: 2015-12-06 | Discharge: 2015-12-06 | Disposition: A | Discharge status: Home / Self Care | Payer: BLUE CROSS/BLUE SHIELD | Attending: Emergency Medicine | Admitting: Emergency Medicine

## 2015-12-06 ENCOUNTER — Encounter (HOSPITAL_COMMUNITY): Payer: Self-pay | Admitting: Emergency Medicine

## 2015-12-06 DIAGNOSIS — R360 Urethral discharge without blood: Secondary | ICD-10-CM | POA: Diagnosis not present

## 2015-12-06 DIAGNOSIS — R369 Urethral discharge, unspecified: Secondary | ICD-10-CM

## 2015-12-06 LAB — URINALYSIS, ROUTINE W REFLEX MICROSCOPIC
Bilirubin Urine: NEGATIVE
Glucose, UA: NEGATIVE mg/dL
Hgb urine dipstick: NEGATIVE
Ketones, ur: NEGATIVE mg/dL
Leukocytes, UA: NEGATIVE
Nitrite: NEGATIVE
Protein, ur: NEGATIVE mg/dL
Specific Gravity, Urine: 1.029 (ref 1.005–1.030)
pH: 6 (ref 5.0–8.0)

## 2015-12-06 MED ORDER — CEFTRIAXONE SODIUM 250 MG IJ SOLR
250.0000 mg | Freq: Once | INTRAMUSCULAR | Status: AC
  Administered 2015-12-06: 250 mg via INTRAMUSCULAR
  Filled 2015-12-06: qty 250

## 2015-12-06 MED ORDER — AZITHROMYCIN 250 MG PO TABS
1000.0000 mg | ORAL_TABLET | Freq: Once | ORAL | Status: AC
  Administered 2015-12-06: 1000 mg via ORAL
  Filled 2015-12-06: qty 4

## 2015-12-06 NOTE — ED Triage Notes (Addendum)
Pt in from home with c/o penile discharge x 1wk. Denies itching/burning, discharge clear. Hx of UTI's, endorses some burning/odor with urination

## 2015-12-06 NOTE — Discharge Instructions (Signed)
Follow up with the health department if your symptoms do not improve. You have gonorrhea and chlamydia swabs pending. These will result in the next 2-3 days. Encourage safe sex practices. Return to the ED if you experience fevers, abdominal or testicular pain.

## 2015-12-06 NOTE — ED Provider Notes (Signed)
[facility] Provider Note   CSN: [id number] Arrival date & time: 12/06/15  0757     History   Chief Complaint Chief Complaint  Patient presents with  . Penile Discharge    HPI Danny Mccoy is a 30 y.o. male with no significant past medical history presents the ED today complaining of penile discharge. Patient states he received oral sex 1 week ago and subsequently developed clear penile discharge. He denies any penile pain, testicular pain, rash, abdominal pain, fevers. He denies any burning with urination. He has history of STI.  HPI  History reviewed. No pertinent past medical history.  Patient Active Problem List   Diagnosis Date Noted  . CONSTIPATION 06/30/2007    History reviewed. No pertinent surgical history.     Home Medications    Prior to Admission medications   Medication Sig Start Date End Date Taking? Authorizing Provider  amoxicillin (AMOXIL) 500 MG capsule Take 2 capsules (1,000 mg total) by mouth 2 (two) times daily. 08/09/14   Hayden Rasmussen, NP  predniSONE (DELTASONE) 20 MG tablet 3 Tabs PO Days 1-3, then 2 tabs PO Days 4-6, then 1 tab PO Day 7-9, then Half Tab PO Day 10-12 08/09/14   Hayden Rasmussen, NP  traMADol (ULTRAM) 50 MG tablet Take 1 tablet (50 mg total) by mouth every 6 (six) hours as needed. 08/09/14   Hayden Rasmussen, NP    Family History No family history on file.  Social History Social History  Substance Use Topics  . Smoking status: Never Smoker  . Smokeless tobacco: Never Used  . Alcohol use Yes     Comment: occ     Allergies   Review of patient's allergies indicates no known allergies.   Review of Systems Review of Systems  All other systems reviewed and are negative.    Physical Exam Updated Vital Signs BP 121/95   Ht 6' (1.829 m)   Wt 93.4 kg   BMI 27.94 kg/m   Physical Exam  Constitutional: He is oriented to person, place, and time. He appears well-developed and well-nourished. No distress.  HENT:  Head:  Normocephalic and atraumatic.  Eyes: Conjunctivae are normal. Right eye exhibits no discharge. Left eye exhibits no discharge. No scleral icterus.  Cardiovascular: Normal rate.   Pulmonary/Chest: Effort normal.  Genitourinary: No penile tenderness.  Neurological: He is alert and oriented to person, place, and time. Coordination normal.  Skin: Skin is warm and dry. No rash noted. He is not diaphoretic. No erythema. No pallor.  Psychiatric: He has a normal mood and affect. His behavior is normal.  Nursing note and vitals reviewed.    ED Treatments / Results  Labs (all labs ordered are listed, but only abnormal results are displayed) Labs Reviewed  URINALYSIS, ROUTINE W REFLEX MICROSCOPIC (NOT AT [facility])  GC/CHLAMYDIA PROBE AMP ([place]) NOT AT [facility]    EKG  EKG Interpretation None       Radiology No results found.  Procedures Procedures (including critical care time)  Medications Ordered in ED Medications  cefTRIAXone (ROCEPHIN) injection 250 mg (not administered)  azithromycin (ZITHROMAX) tablet 1,000 mg (not administered)     Initial Impression / Assessment and Plan / ED Course  I have reviewed the triage vital signs and the nursing notes.  Pertinent labs & imaging results that were available during my care of the patient were reviewed by me and considered in my medical decision making (see chart for details).  Clinical Course    Patient is afebrile  without abdominal tenderness, abdominal pain or painful bowel movements to indicate prostatitis.  No tenderness to palpation of the testes or epididymis to suggest orchitis or epididymitis.  STD cultures obtained including gonorrhea and chlamydia. Pt declined HIV and RPR testing. Patient to be discharged with instructions to follow up with PCP. Discussed importance of using protection when sexually active. Pt understands that they have GC/Chlamydia cultures pending and that they will need to inform all sexual partners if  results return positive. Patient has been treated prophylactically with azithromycin and Rocephin.      Final Clinical Impressions(s) / ED Diagnoses   Final diagnoses:  Penile discharge    New Prescriptions New Prescriptions   No medications on file     Dub MikesSamantha Tripp Sunaina Ferrando, PA-C 12/06/15 1040    Donnetta HutchingBrian Cook, MD 12/08/15 [phone]

## 2015-12-09 LAB — GC/CHLAMYDIA PROBE AMP (~~LOC~~) NOT AT ARMC
Chlamydia: NEGATIVE
Neisseria Gonorrhea: NEGATIVE

## 2016-02-21 ENCOUNTER — Emergency Department (HOSPITAL_COMMUNITY)
Admission: EM | Admit: 2016-02-21 | Discharge: 2016-02-21 | Disposition: A | Discharge status: Home / Self Care | Payer: BLUE CROSS/BLUE SHIELD | Attending: Emergency Medicine | Admitting: Emergency Medicine

## 2016-02-21 ENCOUNTER — Encounter (HOSPITAL_COMMUNITY): Payer: Self-pay

## 2016-02-21 ENCOUNTER — Emergency Department (HOSPITAL_COMMUNITY): Payer: BLUE CROSS/BLUE SHIELD

## 2016-02-21 DIAGNOSIS — J0100 Acute maxillary sinusitis, unspecified: Secondary | ICD-10-CM | POA: Insufficient documentation

## 2016-02-21 DIAGNOSIS — J019 Acute sinusitis, unspecified: Secondary | ICD-10-CM

## 2016-02-21 DIAGNOSIS — R0981 Nasal congestion: Secondary | ICD-10-CM | POA: Diagnosis present

## 2016-02-21 MED ORDER — AMOXICILLIN 500 MG PO CAPS
1000.0000 mg | ORAL_CAPSULE | Freq: Once | ORAL | Status: AC
  Administered 2016-02-21: 1000 mg via ORAL
  Filled 2016-02-21: qty 2

## 2016-02-21 MED ORDER — AMOXICILLIN 500 MG PO CAPS: 1000.0000 mg | ORAL_CAPSULE | Freq: Two times a day (BID) | ORAL | 0 refills | Status: DC

## 2016-02-21 NOTE — ED Provider Notes (Signed)
[facility] Provider Note   CSN: [id number] Arrival date & time: 02/21/16  1929  By signing my name below, I, Rosario AdieWilliam Andrew Hiatt, attest that this documentation has been prepared under the direction and in the presence of [employer] Vineta Carone, PA-C.  Electronically Signed: Rosario AdieWilliam Andrew Hiatt, ED Scribe. 02/21/16. 9:21 PM.  History   Chief Complaint Chief Complaint  Patient presents with  . Nasal Congestion   The history is provided by the patient. No language interpreter was used.   HPI Comments: Danny Mccoy is a 31 y.o. male with no pertinent PMHx, who presents to the Emergency Department complaining of persistent nasal and chest congestion beginning approximately four weeks ago. Pt notes that his symptoms initially began with a cough and sore throat four weeks ago, however these have both since resolved and his congestion has been present since. He also notes associated diarrhea, headache, subjective fever, chills, and sinus pressure/pain secondary to his congestion. Pt took Mucinex yesterday without significant relief of his symptoms. No h/o asthma or DM. He denies chest pain, ear pain, or any other associated symptoms.  History reviewed. No pertinent past medical history.  Patient Active Problem List   Diagnosis Date Noted  . CONSTIPATION 06/30/2007   History reviewed. No pertinent surgical history.  Home Medications    Prior to Admission medications   Medication Sig Start Date End Date Taking? Authorizing Provider  amoxicillin (AMOXIL) 500 MG capsule Take 2 capsules (1,000 mg total) by mouth 2 (two) times daily. 02/21/16   Ismelda Weatherman, PA-C  traMADol (ULTRAM) 50 MG tablet Take 1 tablet (50 mg total) by mouth every 6 (six) hours as needed. Patient not taking: Reported on 12/06/2015 08/09/14   Hayden Rasmussenavid Mabe, NP   Family History No family history on file.  Social History Social History  Substance Use Topics  . Smoking status: Never Smoker  . Smokeless tobacco:  Never Used  . Alcohol use Yes     Comment: occ   Allergies   Patient has no known allergies.  Review of Systems Review of Systems   A complete 10 system review of systems was obtained and all systems are negative except as noted in the HPI and PMH.   Physical Exam Updated Vital Signs BP 104/86   Pulse 74   Temp 98.2 F (36.8 C) (Oral)   Resp 16   SpO2 98%   Physical Exam  Constitutional: He is oriented to person, place, and time. He appears well-developed and well-nourished. No distress.  HENT:  Head: Normocephalic and atraumatic.  Mouth/Throat: Oropharynx is clear and moist.  No drooling or stridor. Posterior pharynx mildly erythematous no significant tonsillar hypertrophy. No exudate. Soft palate rises symmetrically. No TTP or induration under tongue.   ++ Tenderness palpation of the maxillary sinuses bilaterally  No mucosal edema in the nares.  Bilateral tympanic membranes with normal architecture and good light reflex.   Eyes: Conjunctivae and EOM are normal. Pupils are equal, round, and reactive to light.  Neck: Normal range of motion.  Cardiovascular: Normal rate, regular rhythm and intact distal pulses.   Pulmonary/Chest: Effort normal and breath sounds normal.  Abdominal: Soft. There is no tenderness.  Musculoskeletal: Normal range of motion.  Neurological: He is alert and oriented to person, place, and time.  Skin: He is not diaphoretic.  Psychiatric: He has a normal mood and affect.  Nursing note and vitals reviewed.     ED Treatments / Results  DIAGNOSTIC STUDIES: Oxygen Saturation is 98% on RA, normal  by my interpretation.   COORDINATION OF CARE: 9:21 PM-Discussed next steps with pt. Pt verbalized understanding and is agreeable with the plan.   Labs (all labs ordered are listed, but only abnormal results are displayed) Labs Reviewed - No data to display  EKG  EKG Interpretation None      Radiology Dg Chest 2 View  Result Date:  02/21/2016 CLINICAL DATA:  Three-week history of cough and sore throat. EXAM: CHEST  2 VIEW COMPARISON:  None. FINDINGS: The lungs are clear wiithout focal pneumonia, edema, pneumothorax or pleural effusion. The cardiopericardial silhouette is within normal limits for size. Left cervical rib noted at C7. The visualized bony structures of the thorax are intact. Radiodense foreign body overt overlies the left upper quadrant of the abdomen, not seen on the lateral projection and may be external to the patient. IMPRESSION: No active cardiopulmonary disease. Electronically Signed   By: Kennith Center M.D.   On: 02/21/2016 21:10    Procedures Procedures   Medications Ordered in ED Medications  amoxicillin (AMOXIL) capsule 1,000 mg (1,000 mg Oral Given 02/21/16 2128)    Initial Impression / Assessment and Plan / ED Course  I have reviewed the triage vital signs and the nursing notes.  Pertinent labs & imaging results that were available during my care of the patient were reviewed by me and considered in my medical decision making (see chart for details).  Clinical Course    Vitals:   02/21/16 2008  BP: 104/86  Pulse: 74  Resp: 16  Temp: 98.2 F (36.8 C)  TempSrc: Oral  SpO2: 98%    Medications  amoxicillin (AMOXIL) capsule 1,000 mg (1,000 mg Oral Given 02/21/16 2128)    Danny Mccoy is 31 y.o. male presenting with Persistent rhinorrhea worsening over the course of a month with newly evolving sinus pressure. Likely infection. Patient was started on amoxicillin 1000 twice a day. Work note provided.  Evaluation does not show pathology that would require ongoing emergent intervention or inpatient treatment. Pt is hemodynamically stable and mentating appropriately. Discussed findings and plan with patient/guardian, who agrees with care plan. All questions answered. Return precautions discussed and outpatient follow up given.      Final Clinical Impressions(s) / ED Diagnoses   Final  diagnoses:  Acute sinusitis, recurrence not specified, unspecified location   New Prescriptions Discharge Medication List as of 02/21/2016  9:22 PM    START taking these medications   Details  amoxicillin (AMOXIL) 500 MG capsule Take 2 capsules (1,000 mg total) by mouth 2 (two) times daily., Starting Fri 02/21/2016, Print       I personally performed the services described in this documentation, which was scribed in my presence. The recorded information has been reviewed and is accurate.     Wynetta Emery, PA-C 02/21/16 1610    Raeford Razor, MD 03/02/16 1416

## 2016-02-21 NOTE — ED Notes (Signed)
Pt requesting doctors note while in triage

## 2016-02-21 NOTE — ED Triage Notes (Signed)
Pt here with c/o nasal and chest congestion ongoing for about 4 weeks. His cough and sore throat is gone but reports he still feels congested. NAD noted in triage. He took Mucinex yesterday and today without relief.

## 2016-02-21 NOTE — ED Notes (Signed)
Pt transported to XRAY °

## 2016-02-21 NOTE — Discharge Instructions (Signed)
You can use Flonase nasal spray which is avilable over the counter. Use nasal saline (you can try [employer] Simply Saline) at least 4 times a day, use saline 5-10 minutes before using the fluticasone (flonase) nasal spray ° °Do not use Afrin (Oxymetazoline) ° °Rest, wash hands frequently  and drink plenty of water. ° °You may try over the counter medication such as allegra-decongestant or claritin-decongestant and/or Mucinex or decongestants. ° °Push fluids to try to thin the mucus and get it to flow out the nose.  ° °

## 2016-02-21 NOTE — ED Notes (Signed)
Pt back from x-ray.

## 2016-07-07 ENCOUNTER — Encounter (HOSPITAL_COMMUNITY): Payer: Self-pay

## 2016-07-07 ENCOUNTER — Emergency Department (HOSPITAL_COMMUNITY)
Admission: EM | Admit: 2016-07-07 | Discharge: 2016-07-07 | Disposition: A | Discharge status: Home / Self Care | Payer: BLUE CROSS/BLUE SHIELD | Attending: Emergency Medicine | Admitting: Emergency Medicine

## 2016-07-07 DIAGNOSIS — Z202 Contact with and (suspected) exposure to infections with a predominantly sexual mode of transmission: Secondary | ICD-10-CM | POA: Diagnosis not present

## 2016-07-07 DIAGNOSIS — Z711 Person with feared health complaint in whom no diagnosis is made: Secondary | ICD-10-CM

## 2016-07-07 DIAGNOSIS — F17210 Nicotine dependence, cigarettes, uncomplicated: Secondary | ICD-10-CM | POA: Diagnosis not present

## 2016-07-07 NOTE — ED Provider Notes (Signed)
[facility] Provider Note   CSN: [id number] Arrival date & time: 07/07/16  1737   \\By signing my name below, I, Rosana Fret, attest that this documentation has been prepared under the direction and in the presence of non-physician practitioner, Kerrie Buffalo, NP. Electronically Signed: Rosana Fret, ED Scribe. 07/07/16. 6:09 PM.  History   Chief Complaint Chief Complaint  Patient presents with  . Exposure to STD   The history is provided by the patient. No language interpreter was used.  Exposure to STD  Pertinent negatives include no abdominal pain.   HPI Comments: Danny Mccoy is an otherwise healthy  31 y.o. male who presents to the Emergency Department requesting HSV testing. Patient denies any symptoms and states he has never had herpes. Pt states his partner is pregnant and went to the OB to start her prenatal care. They drew blood from her and told her she tested positive for the herpes virus. She is here with the patient and states she does not know what type and that she has never had a herpes outbreak. Patient states he just wants blood drawn to see if he tests positive.   History reviewed. No pertinent past medical history.  Patient Active Problem List   Diagnosis Date Noted  . CONSTIPATION 06/30/2007    History reviewed. No pertinent surgical history.     Home Medications    Prior to Admission medications   Medication Sig Start Date End Date Taking? Authorizing Provider  amoxicillin (AMOXIL) 500 MG capsule Take 2 capsules (1,000 mg total) by mouth 2 (two) times daily. 02/21/16   Pisciotta, Joni Reining, PA-C  traMADol (ULTRAM) 50 MG tablet Take 1 tablet (50 mg total) by mouth every 6 (six) hours as needed. Patient not taking: Reported on 12/06/2015 08/09/14   Hayden Rasmussen, NP    Family History History reviewed. No pertinent family history.  Social History Social History  Substance Use Topics  . Smoking status: Current Every Day Smoker    Types:  Cigarettes, Cigars  . Smokeless tobacco: Never Used     Comment: black and mild 2x/week  . Alcohol use Yes     Comment: occ     Allergies   Patient has no known allergies.   Review of Systems Review of Systems  Constitutional: Negative for fever.  HENT: Negative.   Gastrointestinal: Negative for abdominal pain.  Genitourinary: Negative for discharge and genital sores.  Skin: Negative for color change and wound.     Physical Exam Updated Vital Signs BP (!) 139/92   Pulse 71   Temp 98.4 F (36.9 C) (Oral)   Resp 18   Ht 6' (1.829 m)   Wt 94.3 kg (208 lb)   SpO2 98%   BMI 28.21 kg/m   Physical Exam  Constitutional: He is oriented to person, place, and time. He appears well-developed and well-nourished. No distress.  HENT:  Head: Normocephalic and atraumatic.  Eyes: EOM are normal.  Neck: Neck supple.  Cardiovascular: Normal rate.   Pulmonary/Chest: Effort normal.  Neurological: He is alert and oriented to person, place, and time.  Skin: Skin is warm and dry.  Psychiatric: He has a normal mood and affect.  Nursing note and vitals reviewed.    ED Treatments / Results  DIAGNOSTIC STUDIES: Oxygen Saturation is 98% on RA, normal by my interpretation.   COORDINATION OF CARE: 5:57 PM-Discussed next steps with pt including Herpes virus education. Pt verbalized understanding and is agreeable with the plan which includes f/u with the  health department or his PCP.   Labs (all labs ordered are listed, but only abnormal results are displayed) Labs Reviewed - No data to display  Radiology No results found.  Procedures Procedures (including critical care time)  Medications Ordered in ED Medications - No data to display   Initial Impression / Assessment and Plan / ED Course  I have reviewed the triage vital signs and the nursing notes.  Final Clinical Impressions(s) / ED Diagnoses  31 y.o. male with request for HSV testing. Discussed that we do not do routine  blood test for HSV. He will f/u with the health department.   Final diagnoses:  Concern about STD in male without diagnosis    New Prescriptions Discharge Medication List as of 07/07/2016  6:09 PM    I personally performed the services described in this documentation, which was scribed in my presence. The recorded information has been reviewed and is accurate.    Kerrie Buffaloeese, Dejion Grillo [place], [place] 07/07/16 2153    Shaune PollackIsaacs, [place], MD 07/08/16 1322

## 2016-07-07 NOTE — ED Triage Notes (Signed)
Pt states he had a recent sexual partner who had herpes. Pt denies any symptoms but wants to be checked.

## 2016-07-07 NOTE — Discharge Instructions (Signed)
We do not do routine testing for herpes. You can call the health department to see if they do the testing or go to your primary care doctor.

## 2016-07-29 ENCOUNTER — Emergency Department (HOSPITAL_COMMUNITY)
Admission: EM | Admit: 2016-07-29 | Discharge: 2016-07-29 | Disposition: A | Discharge status: Home / Self Care | Payer: BLUE CROSS/BLUE SHIELD | Attending: Emergency Medicine | Admitting: Emergency Medicine

## 2016-07-29 ENCOUNTER — Encounter (HOSPITAL_COMMUNITY): Payer: Self-pay

## 2016-07-29 DIAGNOSIS — F17200 Nicotine dependence, unspecified, uncomplicated: Secondary | ICD-10-CM | POA: Insufficient documentation

## 2016-07-29 DIAGNOSIS — F17210 Nicotine dependence, cigarettes, uncomplicated: Secondary | ICD-10-CM | POA: Diagnosis not present

## 2016-07-29 DIAGNOSIS — R369 Urethral discharge, unspecified: Secondary | ICD-10-CM | POA: Diagnosis present

## 2016-07-29 MED ORDER — LIDOCAINE HCL (PF) 1 % IJ SOLN
INTRAMUSCULAR | Status: AC
  Administered 2016-07-29: 5 mL
  Filled 2016-07-29: qty 5

## 2016-07-29 MED ORDER — AZITHROMYCIN 250 MG PO TABS
1000.0000 mg | ORAL_TABLET | Freq: Once | ORAL | Status: AC
  Administered 2016-07-29: 1000 mg via ORAL
  Filled 2016-07-29: qty 4

## 2016-07-29 MED ORDER — CEFTRIAXONE SODIUM 250 MG IJ SOLR
250.0000 mg | Freq: Once | INTRAMUSCULAR | Status: AC
  Administered 2016-07-29: 250 mg via INTRAMUSCULAR
  Filled 2016-07-29: qty 250

## 2016-07-29 NOTE — ED Provider Notes (Signed)
[facility] Provider Note   CSN: [id number] Arrival date & time: 07/29/16  6213  By signing my name below, I, Cynda Acres, attest that this documentation has been prepared under the direction and in the presence of Fayrene Helper, PA-C. Electronically Signed: Cynda Acres, Scribe. 07/29/16. 9:19 AM.  History   Chief Complaint Chief Complaint  Patient presents with  . Penile Discharge    HPI Comments: Danny Mccoy is a 31 y.o. male with no pertinent past medical history, who presents to the Emergency Department complaining of sudden-onset penile discharge that began yesterday. Patient reports having unprotected sexual intercourse 4 days ago with a new sexual partner. Patient states he was at work when he suddenly began having penile discharge. Patient reports having a history of chlamydia. Patient has had two sexual partners in the past six months, same sex. No additional symptoms noted. No modifying factors  indicated. Patient denies any fever, chills, penile pain, dysuria, testicle pain, penile rash, or any additional symptoms.   Patient is refusing to be tested for STD's, but states he does want to be treated.   The history is provided by the patient. No language interpreter was used.    History reviewed. No pertinent past medical history.  Patient Active Problem List   Diagnosis Date Noted  . CONSTIPATION 06/30/2007    History reviewed. No pertinent surgical history.     Home Medications    Prior to Admission medications   Medication Sig Start Date End Date Taking? Authorizing Provider  amoxicillin (AMOXIL) 500 MG capsule Take 2 capsules (1,000 mg total) by mouth 2 (two) times daily. 02/21/16   Pisciotta, Joni Reining, PA-C  traMADol (ULTRAM) 50 MG tablet Take 1 tablet (50 mg total) by mouth every 6 (six) hours as needed. Patient not taking: Reported on 12/06/2015 08/09/14   Hayden Rasmussen, NP    Family History No family history on file.  Social History Social  History  Substance Use Topics  . Smoking status: Current Every Day Smoker    Types: Cigarettes, Cigars  . Smokeless tobacco: Never Used     Comment: black and mild 2x/week  . Alcohol use Yes     Comment: occ     Allergies   Patient has no known allergies.   Review of Systems Review of Systems  Constitutional: Negative for chills and fever.  Genitourinary: Positive for discharge. Negative for dysuria, frequency, penile pain, penile swelling, scrotal swelling and testicular pain.  Skin: Negative for rash.     Physical Exam Updated Vital Signs BP 129/68 (BP Location: Right Arm)   Pulse 64   Temp 98.7 F (37.1 C) (Oral)   Resp 18   SpO2 99%   Physical Exam  Constitutional: He is oriented to person, place, and time. He appears well-developed.  HENT:  Head: Normocephalic and atraumatic.  Mouth/Throat: Oropharynx is clear and moist.  Eyes: Conjunctivae and EOM are normal. Pupils are equal, round, and reactive to light.  Neck: Normal range of motion. Neck supple.  Cardiovascular: Normal rate and regular rhythm.   Pulmonary/Chest: Effort normal and breath sounds normal.  Abdominal: Soft. Bowel sounds are normal. There is no tenderness.  Genitourinary: Penis normal.  Genitourinary Comments: Chaperone present throughout entire exam. No inguinal lymph adenopathy. No ingrown hair. Normal circumcised penis free of lesions and rash. Testicles are non-tender with normal lie .Scrotum is soft.   Neurological: He is alert and oriented to person, place, and time.  Skin: Skin is warm and dry.  Psychiatric: He  has a normal mood and affect.  Nursing note and vitals reviewed.    ED Treatments / Results  DIAGNOSTIC STUDIES: Oxygen Saturation is 99% on RA, normal by my interpretation.    COORDINATION OF CARE: 9:18 AM Discussed treatment plan with pt at bedside and pt agreed to plan, which includes antibiotics.   Labs (all labs ordered are listed, but only abnormal results are  displayed) Labs Reviewed - No data to display  EKG  EKG Interpretation None       Radiology No results found.  Procedures Procedures (including critical care time)  Medications Ordered in ED Medications - No data to display   Initial Impression / Assessment and Plan / ED Course  I have reviewed the triage vital signs and the nursing notes.  Pertinent labs & imaging results that were available during my care of the patient were reviewed by me and considered in my medical decision making (see chart for details).     BP 129/68 (BP Location: Right Arm)   Pulse 64   Temp 98.7 F (37.1 C) (Oral)   Resp 18   SpO2 99%    Final Clinical Impressions(s) / ED Diagnoses   Final diagnoses:  Penile discharge    New Prescriptions New Prescriptions   No medications on file   I personally performed the services described in this documentation, which was scribed in my presence. The recorded information has been reviewed and is accurate.   9:38 AM Pt with prior hx of STD here with penile discharge and no other complaint.  Requesting treatment only, and refused testing.  Recommend sexual abstinence until sxs resolved.  Notify partner to get tested.  Return precaution discussed.    Fayrene Helperran, Dwanda Tufano, PA-C 07/29/16 [phone]    Mancel BaleWentz, Elliott, MD 07/29/16 [phone]

## 2016-07-29 NOTE — ED Notes (Signed)
ED Provider at bedside. 

## 2016-07-29 NOTE — ED Triage Notes (Signed)
Pt reports penile discharge for one day. Denies dysuria, denies current pain

## 2016-07-29 NOTE — ED Notes (Signed)
Pt departed in NAD, refused use of wheelchair.  

## 2016-09-22 ENCOUNTER — Encounter (HOSPITAL_COMMUNITY): Payer: Self-pay | Admitting: Physician Assistant

## 2016-09-22 ENCOUNTER — Ambulatory Visit (HOSPITAL_COMMUNITY)
Admission: EM | Admit: 2016-09-22 | Discharge: 2016-09-22 | Disposition: A | Discharge status: Home / Self Care | Payer: BLUE CROSS/BLUE SHIELD | Attending: Physician Assistant | Admitting: Physician Assistant

## 2016-09-22 DIAGNOSIS — R369 Urethral discharge, unspecified: Secondary | ICD-10-CM | POA: Diagnosis not present

## 2016-09-22 DIAGNOSIS — F17210 Nicotine dependence, cigarettes, uncomplicated: Secondary | ICD-10-CM | POA: Insufficient documentation

## 2016-09-22 DIAGNOSIS — Z79899 Other long term (current) drug therapy: Secondary | ICD-10-CM | POA: Diagnosis not present

## 2016-09-22 DIAGNOSIS — R300 Dysuria: Secondary | ICD-10-CM | POA: Diagnosis not present

## 2016-09-22 DIAGNOSIS — Z202 Contact with and (suspected) exposure to infections with a predominantly sexual mode of transmission: Secondary | ICD-10-CM | POA: Diagnosis present

## 2016-09-22 LAB — POCT URINALYSIS DIP (DEVICE)
Bilirubin Urine: NEGATIVE
Glucose, UA: NEGATIVE mg/dL
HGB URINE DIPSTICK: NEGATIVE
KETONES UR: NEGATIVE mg/dL
Leukocytes, UA: NEGATIVE
NITRITE: NEGATIVE
PH: 7 (ref 5.0–8.0)
PROTEIN: NEGATIVE mg/dL
SPECIFIC GRAVITY, URINE: 1.02 (ref 1.005–1.030)
Urobilinogen, UA: 0.2 mg/dL (ref 0.0–1.0)

## 2016-09-22 MED ORDER — AZITHROMYCIN 250 MG PO TABS
1000.0000 mg | ORAL_TABLET | Freq: Once | ORAL | Status: AC
  Administered 2016-09-22: 1000 mg via ORAL

## 2016-09-22 MED ORDER — CEFTRIAXONE SODIUM 250 MG IJ SOLR
INTRAMUSCULAR | Status: AC
  Filled 2016-09-22: qty 250

## 2016-09-22 MED ORDER — CEFTRIAXONE SODIUM 250 MG IJ SOLR
250.0000 mg | Freq: Once | INTRAMUSCULAR | Status: AC
  Administered 2016-09-22: 250 mg via INTRAMUSCULAR

## 2016-09-22 MED ORDER — AZITHROMYCIN 250 MG PO TABS
ORAL_TABLET | ORAL | Status: AC
  Filled 2016-09-22: qty 4

## 2016-09-22 NOTE — Discharge Instructions (Signed)
Urine negative for UTI. You were treated empirically for gonorrhea and Chlamydia. Cytology sent, you will be contacted with any positive results that requires further treatment. Refrain from sexual activity for the next 7 days. Monitor for worsening of symptoms, abdominal pain, fever, nausea, vomiting, follow-up for reevaluation.

## 2016-09-22 NOTE — ED Triage Notes (Signed)
Triaged by provider  

## 2016-09-22 NOTE — ED Provider Notes (Signed)
[facility]    CSN: [id number] Arrival date & time: 09/22/16  1629     History   Chief Complaint Chief Complaint  Patient presents with  . Exposure to STD    HPI Danny Mccoy is a 31 y.o. male.   31 year old male comes in for dysuria and penile discharge. States he is sexually active with one partner, without condom use. Does not know if partner is exhibiting any symptoms. He denies abdominal pain, nausea, vomiting. Has had 2 days of loose stool. Denies fever, chills, night sweats. Denies penile pain, itchiness, genital sores. Denies testicular pain, testicular swelling. Denies back pain, hematuria, frequency.      History reviewed. No pertinent past medical history.  Patient Active Problem List   Diagnosis Date Noted  . CONSTIPATION 06/30/2007    History reviewed. No pertinent surgical history.     Home Medications    Prior to Admission medications   Medication Sig Start Date End Date Taking? Authorizing Provider  amoxicillin (AMOXIL) 500 MG capsule Take 2 capsules (1,000 mg total) by mouth 2 (two) times daily. 02/21/16   Pisciotta, Joni ReiningNicole, PA-C  traMADol (ULTRAM) 50 MG tablet Take 1 tablet (50 mg total) by mouth every 6 (six) hours as needed. Patient not taking: Reported on 12/06/2015 08/09/14   Hayden RasmussenMabe, David, NP    Family History No family history on file.  Social History Social History  Substance Use Topics  . Smoking status: Current Every Day Smoker    Types: Cigarettes, Cigars  . Smokeless tobacco: Never Used     Comment: black and mild 2x/week  . Alcohol use Yes     Comment: occ     Allergies   Patient has no known allergies.   Review of Systems Review of Systems  Reason unable to perform ROS: See HPI as above.     Physical Exam Triage Vital Signs ED Triage Vitals [09/22/16 1658]  Enc Vitals Group     BP 103/85     Pulse Rate (!) 53     Resp 16     Temp 98.5 F (36.9 C)     Temp Source Oral     SpO2 100 %     Weight        Height      Head Circumference      Peak Flow      Pain Score      Pain Loc      Pain Edu?      Excl. in GC?    No data found.   Updated Vital Signs BP 103/85 (BP Location: Left Arm)   Pulse (!) 53   Temp 98.5 F (36.9 C) (Oral)   Resp 16   SpO2 100%    Physical Exam  Constitutional: He is oriented to person, place, and time. He appears well-developed and well-nourished. No distress.  HENT:  Head: Normocephalic and atraumatic.  Cardiovascular: Normal rate, regular rhythm and normal heart sounds.  Exam reveals no gallop and no friction rub.   No murmur heard. Pulmonary/Chest: Effort normal and breath sounds normal. He has no wheezes. He has no rales.  Abdominal: Soft. Bowel sounds are normal. He exhibits no mass. There is no tenderness. There is no rebound, no guarding and no CVA tenderness.  Neurological: He is alert and oriented to person, place, and time.  Skin: Skin is warm and dry.  Psychiatric: He has a normal mood and affect. His behavior is normal. Judgment normal.  UC Treatments / Results  Labs (all labs ordered are listed, but only abnormal results are displayed) Labs Reviewed  URINE CULTURE  POCT URINALYSIS DIP (DEVICE)  URINE CYTOLOGY ANCILLARY ONLY    EKG  EKG Interpretation None       Radiology No results found.  Procedures Procedures (including critical care time)  Medications Ordered in UC Medications  azithromycin (ZITHROMAX) tablet 1,000 mg (1,000 mg Oral Given 09/22/16 1755)  cefTRIAXone (ROCEPHIN) injection 250 mg (250 mg Intramuscular Given 09/22/16 1755)     Initial Impression / Assessment and Plan / UC Course  I have reviewed the triage vital signs and the nursing notes.  Pertinent labs & imaging results that were available during my care of the patient were reviewed by me and considered in my medical decision making (see chart for details).     Urine negative for UTI. Culture sent. Empirical treatment of gonorrhea and  Chlamydia. Azithromycin and Rocephin given in office. Cytology sent, patient will be contacted with any positive results, additional treatment will be called in then. Refrain from sexual activity for the next 7 days. Return precautions given.  Final Clinical Impressions(s) / UC Diagnoses   Final diagnoses:  Penile discharge    New Prescriptions Discharge Medication List as of 09/22/2016  5:40 PM          Belinda Fisher, PA-C 09/22/16 2056

## 2016-09-24 LAB — URINE CULTURE: Culture: NO GROWTH

## 2016-09-24 LAB — URINE CYTOLOGY ANCILLARY ONLY
CHLAMYDIA, DNA PROBE: NEGATIVE
Neisseria Gonorrhea: NEGATIVE
Trichomonas: NEGATIVE

## 2016-11-18 ENCOUNTER — Ambulatory Visit (HOSPITAL_COMMUNITY)
Admission: EM | Admit: 2016-11-18 | Discharge: 2016-11-18 | Disposition: A | Discharge status: Home / Self Care | Payer: BLUE CROSS/BLUE SHIELD | Attending: Family Medicine | Admitting: Family Medicine

## 2016-11-18 ENCOUNTER — Encounter (HOSPITAL_COMMUNITY): Payer: Self-pay | Admitting: Emergency Medicine

## 2016-11-18 DIAGNOSIS — Z202 Contact with and (suspected) exposure to infections with a predominantly sexual mode of transmission: Secondary | ICD-10-CM | POA: Insufficient documentation

## 2016-11-18 DIAGNOSIS — R300 Dysuria: Secondary | ICD-10-CM

## 2016-11-18 DIAGNOSIS — R369 Urethral discharge, unspecified: Secondary | ICD-10-CM | POA: Diagnosis not present

## 2016-11-18 MED ORDER — AZITHROMYCIN 250 MG PO TABS
1000.0000 mg | ORAL_TABLET | Freq: Once | ORAL | Status: AC
  Administered 2016-11-18: 1000 mg via ORAL

## 2016-11-18 MED ORDER — STERILE WATER FOR INJECTION IJ SOLN
INTRAMUSCULAR | Status: AC
  Filled 2016-11-18: qty 10

## 2016-11-18 MED ORDER — CEFTRIAXONE SODIUM 250 MG IJ SOLR
250.0000 mg | Freq: Once | INTRAMUSCULAR | Status: AC
  Administered 2016-11-18: 250 mg via INTRAMUSCULAR

## 2016-11-18 MED ORDER — AZITHROMYCIN 250 MG PO TABS
ORAL_TABLET | ORAL | Status: AC
  Filled 2016-11-18: qty 4

## 2016-11-18 MED ORDER — CEFTRIAXONE SODIUM 250 MG IJ SOLR
INTRAMUSCULAR | Status: AC
  Filled 2016-11-18: qty 250

## 2016-11-18 NOTE — ED Triage Notes (Signed)
PT reports penile discharge for 2 weeks.

## 2016-11-18 NOTE — ED Provider Notes (Signed)
[facility]    CSN: [id number] Arrival date & time: 11/18/16  1556     History   Chief Complaint Chief Complaint  Patient presents with  . Exposure to STD    HPI Danny Mccoy is a 31 y.o. male.   31 year old male planning of a penile discharge for 2 weeks. States the discharge has an odor to it. Occasionally minor and dysuria. Denies external lesions, pops or lumps or pain. Denies scrotal or testicular tenderness or pain      History reviewed. No pertinent past medical history.  Patient Active Problem List   Diagnosis Date Noted  . CONSTIPATION 06/30/2007    History reviewed. No pertinent surgical history.     Home Medications    Prior to Admission medications   Not on File    Family History No family history on file.  Social History Social History  Substance Use Topics  . Smoking status: Current Every Day Smoker    Types: Cigarettes, Cigars  . Smokeless tobacco: Never Used     Comment: black and mild 2x/week  . Alcohol use Yes     Comment: occ     Allergies   Patient has no known allergies.   Review of Systems Review of Systems  Constitutional: Negative.   Gastrointestinal: Negative.   Genitourinary:       As per history of present illness  Neurological: Negative.   All other systems reviewed and are negative.    Physical Exam Triage Vital Signs ED Triage Vitals  Enc Vitals Group     BP 11/18/16 1610 127/80     Pulse Rate 11/18/16 1610 62     Resp 11/18/16 1610 16     Temp 11/18/16 1610 98.7 F (37.1 C)     Temp Source 11/18/16 1610 Oral     SpO2 11/18/16 1610 98 %     Weight 11/18/16 1609 200 lb (90.7 kg)     Height 11/18/16 1609 6' (1.829 m)     Head Circumference --      Peak Flow --      Pain Score 11/18/16 1609 0     Pain Loc --      Pain Edu? --      Excl. in GC? --    No data found.   Updated Vital Signs BP 127/80   Pulse 62   Temp 98.7 F (37.1 C) (Oral)   Resp 16   Ht 6' (1.829 m)   Wt 200  lb (90.7 kg)   SpO2 98%   BMI 27.12 kg/m   Visual Acuity Right Eye Distance:   Left Eye Distance:   Bilateral Distance:    Right Eye Near:   Left Eye Near:    Bilateral Near:     Physical Exam  Constitutional: He is oriented to person, place, and time. He appears well-developed and well-nourished. No distress.  Eyes: EOM are normal.  Neck: Normal range of motion. Neck supple.  Cardiovascular: Normal rate.   Pulmonary/Chest: Effort normal. No respiratory distress.  Musculoskeletal: He exhibits no edema.  Neurological: He is alert and oriented to person, place, and time. He exhibits normal muscle tone.  Skin: Skin is warm and dry.  Psychiatric: He has a normal mood and affect.  Nursing note and vitals reviewed.    UC Treatments / Results  Labs (all labs ordered are listed, but only abnormal results are displayed) Labs Reviewed  URINE CYTOLOGY ANCILLARY ONLY    EKG  EKG  Interpretation None       Radiology No results found.  Procedures Procedures (including critical care time)  Medications Ordered in UC Medications  cefTRIAXone (ROCEPHIN) injection 250 mg (not administered)  azithromycin (ZITHROMAX) tablet 1,000 mg (not administered)     Initial Impression / Assessment and Plan / UC Course  I have reviewed the triage vital signs and the nursing notes.  Pertinent labs & imaging results that were available during my care of the patient were reviewed by me and considered in my medical decision making (see chart for details).    You are being treated for chlamydia and gonorrhea. For any positives on the urine test he will be called and treated over the telephone.     Final Clinical Impressions(s) / UC Diagnoses   Final diagnoses:  Penile discharge  Exposure to STD    New Prescriptions Current Discharge Medication List       Controlled Substance Prescriptions [place] Controlled Substance Registry consulted? Not Applicable   Hayden Rasmussen, NP 11/18/16  [phone]

## 2016-11-18 NOTE — Discharge Instructions (Signed)
You are being treated for chlamydia and gonorrhea. For any positives on the urine test he will be called and treated over the telephone.

## 2016-11-19 LAB — URINE CYTOLOGY ANCILLARY ONLY
CHLAMYDIA, DNA PROBE: NEGATIVE
NEISSERIA GONORRHEA: NEGATIVE
Trichomonas: NEGATIVE

## 2016-11-23 LAB — URINE CYTOLOGY ANCILLARY ONLY: CANDIDA VAGINITIS: NEGATIVE

## 2016-12-15 ENCOUNTER — Encounter (HOSPITAL_COMMUNITY): Payer: Self-pay | Admitting: Emergency Medicine

## 2016-12-15 ENCOUNTER — Ambulatory Visit (HOSPITAL_COMMUNITY)
Admission: EM | Admit: 2016-12-15 | Discharge: 2016-12-15 | Disposition: A | Discharge status: Home / Self Care | Payer: BLUE CROSS/BLUE SHIELD | Attending: Internal Medicine | Admitting: Internal Medicine

## 2016-12-15 DIAGNOSIS — Z202 Contact with and (suspected) exposure to infections with a predominantly sexual mode of transmission: Secondary | ICD-10-CM | POA: Diagnosis present

## 2016-12-15 DIAGNOSIS — R369 Urethral discharge, unspecified: Secondary | ICD-10-CM | POA: Diagnosis not present

## 2016-12-15 DIAGNOSIS — F17210 Nicotine dependence, cigarettes, uncomplicated: Secondary | ICD-10-CM | POA: Insufficient documentation

## 2016-12-15 DIAGNOSIS — Z113 Encounter for screening for infections with a predominantly sexual mode of transmission: Secondary | ICD-10-CM

## 2016-12-15 MED ORDER — LIDOCAINE HCL (PF) 1 % IJ SOLN
INTRAMUSCULAR | Status: AC
  Filled 2016-12-15: qty 2

## 2016-12-15 MED ORDER — CEFTRIAXONE SODIUM 250 MG IJ SOLR
250.0000 mg | Freq: Once | INTRAMUSCULAR | Status: AC
  Administered 2016-12-15: 250 mg via INTRAMUSCULAR

## 2016-12-15 MED ORDER — CEFTRIAXONE SODIUM 250 MG IJ SOLR
INTRAMUSCULAR | Status: AC
  Filled 2016-12-15: qty 250

## 2016-12-15 MED ORDER — AZITHROMYCIN 250 MG PO TABS
ORAL_TABLET | ORAL | Status: AC
  Filled 2016-12-15: qty 4

## 2016-12-15 MED ORDER — AZITHROMYCIN 250 MG PO TABS
1000.0000 mg | ORAL_TABLET | Freq: Once | ORAL | Status: AC
  Administered 2016-12-15: 1000 mg via ORAL

## 2016-12-15 NOTE — ED Triage Notes (Signed)
Complains of burning and discharge.  Patient has a sore throat.

## 2016-12-15 NOTE — ED Provider Notes (Signed)
[facility]    CSN: [id number] Arrival date & time: 12/15/16  1827     History   Chief Complaint Chief Complaint  Patient presents with  . Exposure to STD    HPI Danny Mccoy is a 31 y.o. male.   Presents today STD screening. Patient reports 1 week duration of clear penile discharge. Patient reports to be sexually active with 1 partner but his partner may have other partners. Denies abdominal pain, fever, groin pain, penile rash, testicular pain or swelling. Patient requesting to be treated presumptively.       History reviewed. No pertinent past medical history.  Patient Active Problem List   Diagnosis Date Noted  . CONSTIPATION 06/30/2007    History reviewed. No pertinent surgical history.     Home Medications    Prior to Admission medications   Not on File    Family History No family history on file.  Social History Social History   Tobacco Use  . Smoking status: Current Every Day Smoker    Types: Cigarettes, Cigars  . Smokeless tobacco: Never Used  . Tobacco comment: black and mild 2x/week  Substance Use Topics  . Alcohol use: Yes    Comment: occ  . Drug use: No     Allergies   Patient has no known allergies.   Review of Systems Review of Systems  Constitutional:       See HPI     Physical Exam Triage Vital Signs ED Triage Vitals  Enc Vitals Group     BP 12/15/16 1924 120/66     Pulse Rate 12/15/16 1924 67     Resp 12/15/16 1924 18     Temp 12/15/16 1924 98.7 F (37.1 C)     Temp Source 12/15/16 1924 Oral     SpO2 12/15/16 1924 100 %     Weight --      Height --      Head Circumference --      Peak Flow --      Pain Score 12/15/16 1923 3     Pain Loc --      Pain Edu? --      Excl. in GC? --    No data found.  Updated Vital Signs BP 120/66 (BP Location: Left Arm)   Pulse 67   Temp 98.7 F (37.1 C) (Oral)   Resp 18   SpO2 100%   Visual Acuity Right Eye Distance:   Left Eye Distance:   Bilateral  Distance:    Right Eye Near:   Left Eye Near:    Bilateral Near:     Physical Exam  Constitutional: He appears well-developed and well-nourished.  HENT:  Head: Normocephalic.  Right Ear: External ear normal.  Left Ear: External ear normal.  Eyes: Pupils are equal, round, and reactive to light.  Neck: Normal range of motion. Neck supple.  Cardiovascular: Normal rate, regular rhythm and normal heart sounds.  Pulmonary/Chest: Effort normal and breath sounds normal.  Abdominal: Soft. Bowel sounds are normal. There is no tenderness.  Genitourinary:  Genitourinary Comments: Declined  Lymphadenopathy:    He has no cervical adenopathy.  Skin: Skin is warm and dry.  Nursing note and vitals reviewed.    UC Treatments / Results  Labs (all labs ordered are listed, but only abnormal results are displayed) Labs Reviewed - No data to display  EKG  EKG Interpretation None       Radiology No results found.  Procedures Procedures (including  critical care time)  Medications Ordered in UC Medications - No data to display   Initial Impression / Assessment and Plan / UC Course  I have reviewed the triage vital signs and the nursing notes.  Pertinent labs & imaging results that were available during my care of the patient were reviewed by me and considered in my medical decision making (see chart for details).  Final Clinical Impressions(s) / UC Diagnoses   Final diagnoses:  None   Cytology pending. Treated presumptively today with Azithromycin and Rocephin. Advised to avoid sexual activity cytology result comes back.   ED Discharge Orders    None     Controlled Substance Prescriptions [place] Controlled Substance Registry consulted? Not Applicable   Lucia EstelleZheng, Kielee Care, NP 12/15/16 2131

## 2016-12-15 NOTE — ED Notes (Signed)
Sent to the bathroom for a dirty and clean specimens with explanation.

## 2016-12-16 LAB — URINE CYTOLOGY ANCILLARY ONLY
Chlamydia: NEGATIVE
Neisseria Gonorrhea: NEGATIVE

## 2017-02-22 ENCOUNTER — Ambulatory Visit (HOSPITAL_COMMUNITY)
Admission: EM | Admit: 2017-02-22 | Discharge: 2017-02-22 | Disposition: A | Discharge status: Home / Self Care | Payer: BLUE CROSS/BLUE SHIELD | Attending: Family Medicine | Admitting: Family Medicine

## 2017-02-22 ENCOUNTER — Encounter (HOSPITAL_COMMUNITY): Payer: Self-pay | Admitting: Family Medicine

## 2017-02-22 DIAGNOSIS — J111 Influenza due to unidentified influenza virus with other respiratory manifestations: Secondary | ICD-10-CM

## 2017-02-22 DIAGNOSIS — R69 Illness, unspecified: Principal | ICD-10-CM

## 2017-02-22 MED ORDER — AZITHROMYCIN 250 MG PO TABS: 250.0000 mg | ORAL_TABLET | Freq: Every day | ORAL | 0 refills | Status: DC

## 2017-02-22 MED ORDER — HYDROCODONE-HOMATROPINE 5-1.5 MG/5ML PO SYRP: 5.0000 mL | ORAL_SOLUTION | Freq: Four times a day (QID) | ORAL | 0 refills | Status: DC | PRN

## 2017-02-22 NOTE — ED Provider Notes (Signed)
[facility]   [id number] 02/22/17 Arrival Time: 2000   SUBJECTIVE:  Danny ArbourJohnathan Mccoy is a 32 y.o. male who presents to the urgent care with complaint of flu like symptoms. PT reports cough, weakness, body aches, neck pain, sore throat that started 2 days ago.   Symptoms began gradually.  Taking Advil today.  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 No pertinent past medical history. History reviewed. No pertinent family history. Social History   Socioeconomic History  . Marital status: Single    Spouse name: Not on file  . Number of children: Not on file  . Years of education: Not on file  . Highest education level: Not on file  Social Needs  . Financial resource strain: Not on file  . Food insecurity - worry: Not on file  . Food insecurity - inability: Not on file  . Transportation needs - medical: Not on file  . Transportation needs - non-medical: Not on file  Occupational History  . Not on file  Tobacco Use  . Smoking status: Current Every Day Smoker    Types: Cigarettes, Cigars  . Smokeless tobacco: Never Used  . Tobacco comment: black and mild 2x/week  Substance and Sexual Activity  . Alcohol use: Yes    Comment: occ  . Drug use: No  . Sexual activity: Yes    Birth control/protection: Condom  Other Topics Concern  . Not on file  Social History Narrative  . Not on file   No outpatient medications have been marked as taking for the 02/22/17 encounter [facility](Hospital Encounter).   No Known Allergies    ROS: As per HPI, remainder of ROS negative.   OBJECTIVE:   Vitals:   02/22/17 2007 02/22/17 2009  BP: 116/75   Pulse: 100   Resp: 20   Temp: 99.4 F (37.4 C)   TempSrc: Oral   SpO2: 96%   Weight:  205 lb (93 kg)  Height:  6' (1.829 m)     General appearance: alert; no distress; but looks ill Eyes: PERRL; EOMI; conjunctiva mildly injected HENT: normocephalic; atraumatic; TMs normal, canal normal, external ears normal  without trauma; nasal mucosa normal; pharynx mildly erythematous Neck: supple Lungs: clear to auscultation bilaterally Heart: regular rate and rhythm Abdomen: soft, non-tender; bowel sounds normal; no masses or organomegaly; no guarding or rebound tenderness Back: no CVA tenderness Extremities: no cyanosis or edema; symmetrical with no gross deformities Skin: warm and dry Neurologic: normal gait; grossly normal Psychological: alert and cooperative; normal mood and affect      Labs:  Results for orders placed or performed during the hospital encounter of 12/15/16  Urine cytology ancillary only  Result Value Ref Range   Chlamydia Negative    Neisseria gonorrhea Negative     Labs Reviewed - No data to display  No results found.     ASSESSMENT & PLAN:  1. Influenza-like illness     Meds ordered this encounter  Medications  . azithromycin (ZITHROMAX) 250 MG tablet    Sig: Take 1 tablet (250 mg total) by mouth daily. Take first 2 tablets together, then 1 every day until finished.    Dispense:  6 tablet    Refill:  0  . HYDROcodone-homatropine (HYDROMET) 5-1.5 MG/5ML syrup    Sig: Take 5 mLs by mouth every 6 (six) hours as needed for cough.    Dispense:  60 mL    Refill:  0    Reviewed expectations re: course  of current medical issues. Questions answered. Outlined signs and symptoms indicating need for more acute intervention. Patient verbalized understanding. After Visit Summary given.    Procedures:      Elvina Sidle, MD 02/22/17 2018

## 2017-02-22 NOTE — Discharge Instructions (Signed)
Drink plenty of fluids.  Return if you experience shortness of breath or worsening symptoms.

## 2017-02-22 NOTE — ED Triage Notes (Signed)
PT reports cough, weakness, body aches, neck pain, sore throat that started 2 days ago.

## 2017-04-12 ENCOUNTER — Other Ambulatory Visit: Payer: Self-pay

## 2017-04-12 ENCOUNTER — Ambulatory Visit (HOSPITAL_COMMUNITY)
Admission: EM | Admit: 2017-04-12 | Discharge: 2017-04-12 | Disposition: A | Discharge status: Home / Self Care | Payer: BLUE CROSS/BLUE SHIELD | Attending: Emergency Medicine | Admitting: Emergency Medicine

## 2017-04-12 ENCOUNTER — Encounter (HOSPITAL_COMMUNITY): Payer: Self-pay | Admitting: Emergency Medicine

## 2017-04-12 DIAGNOSIS — J00 Acute nasopharyngitis [common cold]: Secondary | ICD-10-CM | POA: Diagnosis not present

## 2017-04-12 MED ORDER — CETIRIZINE-PSEUDOEPHEDRINE ER 5-120 MG PO TB12: 1.0000 | ORAL_TABLET | Freq: Every day | ORAL | 0 refills | Status: DC

## 2017-04-12 MED ORDER — FLUTICASONE PROPIONATE 50 MCG/ACT NA SUSP: 1.0000 | Freq: Every day | NASAL | 2 refills | Status: DC

## 2017-04-12 NOTE — Discharge Instructions (Signed)
Push fluids to ensure adequate hydration and keep secretions thin.  Tylenol and/or ibuprofen as needed for pain or fevers.  Daily flonase as well as daily zyrtec d to help with symptoms. If symptoms worsen or do not improve in the next week to return to be seen or to follow up with your PCP.

## 2017-04-12 NOTE — ED Triage Notes (Signed)
Phlegm with coughing, , neck soreness, and fatigue, sore throat-symptoms for 4 days

## 2017-04-12 NOTE — ED Provider Notes (Signed)
[facility]    CSN: [id number] Arrival date & time: 04/12/17  1002     History   Chief Complaint Chief Complaint  Patient presents with  . URI    HPI Danny Mccoy is a 32 y.o. male.   Danny Mccoy presents with complaints of so which started 3/1. This morning with slight cough. Symptoms worse in the morning when awakens, mucus present. Minimal runny nose or ear pain. No known fevers. No known ill contacts. Rates pain 4/10. Denies gi/gu complaints. Without skin rash. Had influenza last month and states this feels much less severe. Took nyquil last night which helped and ibuprofen has helped as well. Without other contributing medical history.   ROS per HPI.       History reviewed. No pertinent past medical history.  Patient Active Problem List   Diagnosis Date Noted  . CONSTIPATION 06/30/2007    History reviewed. No pertinent surgical history.     Home Medications    Prior to Admission medications   Medication Sig Start Date End Date Taking? Authorizing Provider  ibuprofen (ADVIL,MOTRIN) 200 MG tablet Take 200 mg by mouth every 6 (six) hours as needed.   Yes [provider]  cetirizine-pseudoephedrine (ZYRTEC-D) 5-120 MG tablet Take 1 tablet by mouth daily. 04/12/17   Georgetta HaberBurky, Asjia Berrios B, NP  fluticasone (FLONASE) 50 MCG/ACT nasal spray Place 1 spray into both nostrils daily. 04/12/17   Georgetta HaberBurky, Stepfanie Yott B, NP    Family History History reviewed. No pertinent family history.  Social History Social History   Tobacco Use  . Smoking status: Current Every Day Smoker    Types: Cigarettes, Cigars  . Smokeless tobacco: Never Used  . Tobacco comment: black and mild 2x/week  Substance Use Topics  . Alcohol use: Yes    Comment: occ  . Drug use: No     Allergies   Patient has no known allergies.   Review of Systems Review of Systems   Physical Exam Triage Vital Signs ED Triage Vitals  Enc Vitals Group     BP 04/12/17 1032 130/71     Pulse  Rate 04/12/17 1032 68     Resp 04/12/17 1032 16     Temp 04/12/17 1032 98.7 F (37.1 C)     Temp Source 04/12/17 1032 Oral     SpO2 04/12/17 1032 99 %     Weight --      Height --      Head Circumference --      Peak Flow --      Pain Score 04/12/17 1035 4     Pain Loc --      Pain Edu? --      Excl. in GC? --    No data found.  Updated Vital Signs BP 130/71 (BP Location: Right Arm)   Pulse 68   Temp 98.7 F (37.1 C) (Oral)   Resp 16   SpO2 99%   Visual Acuity Right Eye Distance:   Left Eye Distance:   Bilateral Distance:    Right Eye Near:   Left Eye Near:    Bilateral Near:     Physical Exam  Constitutional: He is oriented to person, place, and time. He appears well-developed and well-nourished.  HENT:  Head: Normocephalic and atraumatic.  Right Ear: Tympanic membrane, external ear and ear canal normal.  Left Ear: Tympanic membrane, external ear and ear canal normal.  Nose: Mucosal edema and rhinorrhea present. Right sinus exhibits no maxillary sinus tenderness and no  frontal sinus tenderness. Left sinus exhibits no maxillary sinus tenderness and no frontal sinus tenderness.  Mouth/Throat: Uvula is midline and mucous membranes are normal. Posterior oropharyngeal erythema present. No posterior oropharyngeal edema. Tonsils are 1+ on the right. Tonsils are 1+ on the left. No tonsillar exudate.  Eyes: Conjunctivae are normal. Pupils are equal, round, and reactive to light.  Neck: Normal range of motion.  Cardiovascular: Normal rate and regular rhythm.  Pulmonary/Chest: Effort normal and breath sounds normal.  Lymphadenopathy:    He has no cervical adenopathy.  Neurological: He is alert and oriented to person, place, and time.  Skin: Skin is warm and dry.  Vitals reviewed.    UC Treatments / Results  Labs (all labs ordered are listed, but only abnormal results are displayed) Labs Reviewed - No data to display  EKG  EKG Interpretation None        Radiology No results found.  Procedures Procedures (including critical care time)  Medications Ordered in UC Medications - No data to display   Initial Impression / Assessment and Plan / UC Course  I have reviewed the triage vital signs and the nursing notes.  Pertinent labs & imaging results that were available during my care of the patient were reviewed by me and considered in my medical decision making (see chart for details).     Benign physical findings. History and physical consistent with viral illness.  Supportive cares recommended. If symptoms worsen or do not improve in the next week to return to be seen or to follow up with PCP.  Patient verbalized understanding and agreeable to plan.    Final Clinical Impressions(s) / UC Diagnoses   Final diagnoses:  Acute nasopharyngitis    ED Discharge Orders        Ordered    fluticasone (FLONASE) 50 MCG/ACT nasal spray  Daily     04/12/17 1045    cetirizine-pseudoephedrine (ZYRTEC-D) 5-120 MG tablet  Daily     04/12/17 1045       Controlled Substance Prescriptions [place] Controlled Substance Registry consulted? Not Applicable   Georgetta Haber, NP 04/12/17 1049

## 2017-05-29 ENCOUNTER — Encounter (HOSPITAL_COMMUNITY): Payer: Self-pay | Admitting: Emergency Medicine

## 2017-05-29 ENCOUNTER — Emergency Department (HOSPITAL_COMMUNITY)
Admission: EM | Admit: 2017-05-29 | Discharge: 2017-05-29 | Disposition: A | Discharge status: Home / Self Care | Payer: BLUE CROSS/BLUE SHIELD | Attending: Physician Assistant | Admitting: Physician Assistant

## 2017-05-29 ENCOUNTER — Other Ambulatory Visit: Payer: Self-pay

## 2017-05-29 DIAGNOSIS — Z711 Person with feared health complaint in whom no diagnosis is made: Secondary | ICD-10-CM

## 2017-05-29 DIAGNOSIS — R360 Urethral discharge without blood: Secondary | ICD-10-CM | POA: Diagnosis present

## 2017-05-29 DIAGNOSIS — Z79899 Other long term (current) drug therapy: Secondary | ICD-10-CM | POA: Insufficient documentation

## 2017-05-29 DIAGNOSIS — F17210 Nicotine dependence, cigarettes, uncomplicated: Secondary | ICD-10-CM | POA: Diagnosis not present

## 2017-05-29 LAB — URINALYSIS, ROUTINE W REFLEX MICROSCOPIC
BILIRUBIN URINE: NEGATIVE
GLUCOSE, UA: NEGATIVE mg/dL
HGB URINE DIPSTICK: NEGATIVE
Ketones, ur: NEGATIVE mg/dL
Leukocytes, UA: NEGATIVE
Nitrite: NEGATIVE
PH: 5 (ref 5.0–8.0)
Protein, ur: NEGATIVE mg/dL
SPECIFIC GRAVITY, URINE: 1.025 (ref 1.005–1.030)

## 2017-05-29 MED ORDER — LIDOCAINE HCL (PF) 1 % IJ SOLN
INTRAMUSCULAR | Status: AC
  Administered 2017-05-29: 5 mL
  Filled 2017-05-29: qty 5

## 2017-05-29 MED ORDER — CEFTRIAXONE SODIUM 250 MG IJ SOLR
250.0000 mg | Freq: Once | INTRAMUSCULAR | Status: AC
  Administered 2017-05-29: 250 mg via INTRAMUSCULAR
  Filled 2017-05-29: qty 250

## 2017-05-29 MED ORDER — AZITHROMYCIN 250 MG PO TABS
1000.0000 mg | ORAL_TABLET | Freq: Once | ORAL | Status: AC
  Administered 2017-05-29: 1000 mg via ORAL
  Filled 2017-05-29: qty 4

## 2017-05-29 NOTE — ED Provider Notes (Signed)
[facility] EMERGENCY DEPARTMENT Provider Note   CSN: [id number] Arrival date & time: 05/29/17  1330     History   Chief Complaint Chief Complaint  Patient presents with  . Penile Discharge    HPI Danny Mccoy is a 32 y.o. male who presents for evaluation of penile discharge x1 week.  Patient reports he is also had some discomfort with urination.  Denies any hematuria.  Patient states that he is currently sexually active with one male partner.  They do not use any protection.  Patient states he has not had any testicular pain, swelling, redness.  Patient denies any fevers.  The history is provided by the patient.    History reviewed. No pertinent past medical history.  Patient Active Problem List   Diagnosis Date Noted  . CONSTIPATION 06/30/2007    History reviewed. No pertinent surgical history.      Home Medications    Prior to Admission medications   Medication Sig Start Date End Date Taking? Authorizing Provider  cetirizine-pseudoephedrine (ZYRTEC-D) 5-120 MG tablet Take 1 tablet by mouth daily. 04/12/17   Georgetta Haber, NP  fluticasone (FLONASE) 50 MCG/ACT nasal spray Place 1 spray into both nostrils daily. 04/12/17   Georgetta Haber, NP  ibuprofen (ADVIL,MOTRIN) 200 MG tablet Take 200 mg by mouth every 6 (six) hours as needed.    [provider]    Family History History reviewed. No pertinent family history.  Social History Social History   Tobacco Use  . Smoking status: Current Every Day Smoker    Types: Cigarettes, Cigars  . Smokeless tobacco: Never Used  . Tobacco comment: black and mild 2x/week  Substance Use Topics  . Alcohol use: Yes    Comment: occ  . Drug use: No     Allergies   Patient has no known allergies.   Review of Systems Review of Systems  Constitutional: Negative for fever.  Genitourinary: Positive for discharge and dysuria. Negative for hematuria, penile pain, penile swelling, scrotal  swelling and testicular pain.     Physical Exam Updated Vital Signs BP 121/69 (BP Location: Right Arm)   Pulse (!) 57   Temp 98 F (36.7 C) (Oral)   Resp 16   SpO2 99%   Physical Exam  Constitutional: He appears well-developed and well-nourished.  HENT:  Head: Normocephalic and atraumatic.  Eyes: Conjunctivae and EOM are normal. Right eye exhibits no discharge. Left eye exhibits no discharge. No scleral icterus.  Pulmonary/Chest: Effort normal.  Abdominal: Hernia confirmed negative in the right inguinal area and confirmed negative in the left inguinal area.  Genitourinary: Testes normal and penis normal. Right testis shows no swelling and no tenderness. Left testis shows no swelling and no tenderness. Circumcised. No penile tenderness.  Genitourinary Comments: The exam was performed with a chaperone present. Normal male genitalia. No evidence of rash, ulcers or lesions.   Neurological: He is alert.  Skin: Skin is warm and dry.  Psychiatric: He has a normal mood and affect. His speech is normal and behavior is normal.  Nursing note and vitals reviewed.    ED Treatments / Results  Labs (all labs ordered are listed, but only abnormal results are displayed) Labs Reviewed  URINALYSIS, ROUTINE W REFLEX MICROSCOPIC  GC/CHLAMYDIA PROBE AMP ([place]) NOT AT [facility]    EKG None  Radiology No results found.  Procedures Procedures (including critical care time)  Medications Ordered in ED Medications  cefTRIAXone (ROCEPHIN) injection 250 mg (250 mg Intramuscular  Given 05/29/17 1615)  azithromycin (ZITHROMAX) tablet 1,000 mg (1,000 mg Oral Given 05/29/17 1614)  lidocaine (PF) (XYLOCAINE) 1 % injection (5 mLs  Given 05/29/17 1615)     Initial Impression / Assessment and Plan / ED Course  I have reviewed the triage vital signs and the nursing notes.  Pertinent labs & imaging results that were available during my care of the patient were reviewed by me and considered in my  medical decision making (see chart for details).     32 year old male who presents for evaluation of penile discharge x1 week.  Also reports some dysuria.  No hematuria, fevers, testicular pain or swelling.  Reports he is currently sexually active with one male partner.  They do not use protection.  Denies any rashes, lesions, ulcers. Patient is afebrile, non-toxic appearing, sitting comfortably on examination table. Vital signs reviewed and stable.  GU exam shows no evidence of rashes, ulcers, lesions.  No testicular pain, swelling.  History/physical exam is not concerning for orchitis, epididymitis.  UA done at triage.  Patient refused urethral swab for evaluation of GC/chlamydia.  I discussed risk first benefits of declining a urethral swab.  Patient expresses full understanding and wishes to decline.  He exhibits full medical decision-making capacity.  We will add on GC/chlamydia to urine.  Normal GU exam.  I discussed treatment options with patient.  Patient would like to be treated for STDs today in the department.  Denies any history of allergies.  Will plan for treatment.  Discussed with patient regarding safe sex practices.  Encouraged him to inform his partner abstain from intercourse until his partner has been treated.  Patient had ample opportunity for questions and discussion. All patient's questions were answered with full understanding. Strict return precautions discussed. Patient expresses understanding and agreement to plan.   Final Clinical Impressions(s) / ED Diagnoses   Final diagnoses:  Concern about STD in male without diagnosis    ED Discharge Orders    None       Rosana HoesLayden, Uriel Dowding A, PA-C 05/29/17 1640    Abelino DerrickMackuen, Courteney Lyn, MD 05/29/17 2339

## 2017-05-29 NOTE — ED Triage Notes (Signed)
Patent presents to ED for assessment of penile discharge(clear) and pain x 1 week.

## 2017-05-29 NOTE — Discharge Instructions (Signed)

## 2017-05-29 NOTE — ED Notes (Signed)
Declined W/C at D/C and was escorted to lobby by RN. 

## 2017-07-04 ENCOUNTER — Ambulatory Visit (HOSPITAL_COMMUNITY)
Admission: EM | Admit: 2017-07-04 | Discharge: 2017-07-04 | Disposition: A | Discharge status: Home / Self Care | Payer: BLUE CROSS/BLUE SHIELD | Attending: Family Medicine | Admitting: Family Medicine

## 2017-07-04 ENCOUNTER — Encounter (HOSPITAL_COMMUNITY): Payer: Self-pay | Admitting: Emergency Medicine

## 2017-07-04 DIAGNOSIS — Z202 Contact with and (suspected) exposure to infections with a predominantly sexual mode of transmission: Secondary | ICD-10-CM

## 2017-07-04 DIAGNOSIS — R300 Dysuria: Secondary | ICD-10-CM | POA: Diagnosis not present

## 2017-07-04 DIAGNOSIS — R369 Urethral discharge, unspecified: Secondary | ICD-10-CM

## 2017-07-04 DIAGNOSIS — F17210 Nicotine dependence, cigarettes, uncomplicated: Secondary | ICD-10-CM | POA: Insufficient documentation

## 2017-07-04 DIAGNOSIS — Z113 Encounter for screening for infections with a predominantly sexual mode of transmission: Secondary | ICD-10-CM

## 2017-07-04 MED ORDER — CEFTRIAXONE SODIUM 250 MG IJ SOLR
INTRAMUSCULAR | Status: AC
  Filled 2017-07-04: qty 250

## 2017-07-04 MED ORDER — AZITHROMYCIN 250 MG PO TABS
ORAL_TABLET | ORAL | Status: AC
  Filled 2017-07-04: qty 4

## 2017-07-04 MED ORDER — LIDOCAINE HCL (PF) 1 % IJ SOLN
INTRAMUSCULAR | Status: AC
  Filled 2017-07-04: qty 2

## 2017-07-04 MED ORDER — AZITHROMYCIN 250 MG PO TABS
1000.0000 mg | ORAL_TABLET | Freq: Once | ORAL | Status: AC
  Administered 2017-07-04: 1000 mg via ORAL

## 2017-07-04 MED ORDER — CEFTRIAXONE SODIUM 250 MG IJ SOLR
250.0000 mg | Freq: Once | INTRAMUSCULAR | Status: AC
  Administered 2017-07-04: 250 mg via INTRAMUSCULAR

## 2017-07-04 NOTE — ED Triage Notes (Signed)
Pt c/o penile discharge x 4 days 

## 2017-07-04 NOTE — ED Provider Notes (Signed)
[facility]   [id number] 07/04/17 Arrival Time: 1831   SUBJECTIVE:  Danny Mccoy is a 32 y.o. male who presents to the urgent care with complaint of four days of penile discharge and dysuria.    Patient had the same symptoms one month ago and was treated in the emergency room for gonorrhea. He is gone back to having oral sex with his girlfriend who has not been treated.  No fever or joint pain, rash History reviewed. No pertinent past medical history. History reviewed. No pertinent family history. Social History   Socioeconomic History  . Marital status: Single    Spouse name: Not on file  . Number of children: Not on file  . Years of education: Not on file  . Highest education level: Not on file  Occupational History  . Not on file  Social Needs  . Financial resource strain: Not on file  . Food insecurity:    Worry: Not on file    Inability: Not on file  . Transportation needs:    Medical: Not on file    Non-medical: Not on file  Tobacco Use  . Smoking status: Current Every Day Smoker    Types: Cigarettes, Cigars  . Smokeless tobacco: Never Used  . Tobacco comment: black and mild 2x/week  Substance and Sexual Activity  . Alcohol use: Yes    Comment: occ  . Drug use: No  . Sexual activity: Yes    Birth control/protection: Condom  Lifestyle  . Physical activity:    Days per week: Not on file    Minutes per session: Not on file  . Stress: Not on file  Relationships  . Social connections:    Talks on phone: Not on file    Gets together: Not on file    Attends religious service: Not on file    Active member of club or organization: Not on file    Attends meetings of clubs or organizations: Not on file    Relationship status: Not on file  . Intimate partner violence:    Fear of current or ex partner: Not on file    Emotionally abused: Not on file    Physically abused: Not on file    Forced sexual activity: Not on file  Other Topics Concern  .  Not on file  Social History Narrative  . Not on file   No outpatient medications have been marked as taking for the 07/04/17 encounter [facility] Encounter).   No Known Allergies    ROS: As per HPI, remainder of ROS negative.   OBJECTIVE:   Vitals:   07/04/17 1836  BP: 107/68  Pulse: 85  Resp: 18  Temp: 98.3 F (36.8 C)  SpO2: 98%     General appearance: alert; no distress Eyes: PERRL; EOMI; conjunctiva normal HENT: normocephalic; atraumatic;  Neck: supple Back: no CVA tenderness Penis: Circumcised without rash, watery discharge noted Extremities: no cyanosis or edema; symmetrical with no gross deformities Skin: warm and dry Neurologic: normal gait; grossly normal Psychological: alert and cooperative; normal mood and affect      Labs:  Results for orders placed or performed during the hospital encounter of 05/29/17  Urinalysis, Routine w reflex microscopic  Result Value Ref Range   Color, Urine YELLOW YELLOW   APPearance CLEAR CLEAR   Specific Gravity, Urine 1.025 1.005 - 1.030   pH 5.0 5.0 - 8.0   Glucose, UA NEGATIVE NEGATIVE mg/dL   Hgb urine dipstick NEGATIVE NEGATIVE   Bilirubin  Urine NEGATIVE NEGATIVE   Ketones, ur NEGATIVE NEGATIVE mg/dL   Protein, ur NEGATIVE NEGATIVE mg/dL   Nitrite NEGATIVE NEGATIVE   Leukocytes, UA NEGATIVE NEGATIVE    Labs Reviewed  URINE CYTOLOGY ANCILLARY ONLY    No results found.     ASSESSMENT & PLAN:  1. Penile discharge   f you're going to have sex in the next week, use a condom.  Notify your girlfriend of your recent visit and need for treatment  Meds ordered this encounter  Medications  . azithromycin (ZITHROMAX) tablet 1,000 mg  . cefTRIAXone (ROCEPHIN) injection 250 mg    Reviewed expectations re: course of current medical issues. Questions answered. Outlined signs and symptoms indicating need for more acute intervention. Patient verbalized understanding. After Visit Summary given.         Elvina Sidle, MD 07/04/17 1900

## 2017-07-04 NOTE — Discharge Instructions (Addendum)
If you're going to have sex in the next week, use a condom.  Notify your girlfriend of your recent visit and need for treatment

## 2017-07-06 LAB — URINE CYTOLOGY ANCILLARY ONLY
Chlamydia: NEGATIVE
Neisseria Gonorrhea: NEGATIVE
Trichomonas: NEGATIVE

## 2017-07-09 ENCOUNTER — Ambulatory Visit (HOSPITAL_COMMUNITY)
Admission: EM | Admit: 2017-07-09 | Discharge: 2017-07-09 | Disposition: A | Discharge status: Home / Self Care | Payer: BLUE CROSS/BLUE SHIELD | Attending: Family Medicine | Admitting: Family Medicine

## 2017-07-09 ENCOUNTER — Encounter (HOSPITAL_COMMUNITY): Payer: Self-pay | Admitting: Emergency Medicine

## 2017-07-09 DIAGNOSIS — K0889 Other specified disorders of teeth and supporting structures: Secondary | ICD-10-CM | POA: Diagnosis not present

## 2017-07-09 DIAGNOSIS — J069 Acute upper respiratory infection, unspecified: Secondary | ICD-10-CM

## 2017-07-09 MED ORDER — AMOXICILLIN-POT CLAVULANATE 875-125 MG PO TABS: 1.0000 | ORAL_TABLET | Freq: Two times a day (BID) | ORAL | 0 refills | Status: DC

## 2017-07-09 MED ORDER — CETIRIZINE-PSEUDOEPHEDRINE ER 5-120 MG PO TB12: 1.0000 | ORAL_TABLET | Freq: Every day | ORAL | 0 refills | Status: DC

## 2017-07-09 MED ORDER — LIDOCAINE VISCOUS HCL 2 % MT SOLN: 15.0000 mL | Freq: Four times a day (QID) | OROMUCOSAL | 0 refills | Status: DC | PRN

## 2017-07-09 NOTE — Discharge Instructions (Addendum)
Get plenty of rest and push fluids Zyrtec-D prescribed Augmentin prescribed Viscous lidocaine prescribed Take medications as directed and to completion Use OTC medication as needed for symptomatic relief Follow up with PCP if symptoms persist Return or go to ER if you have any new or worsening symptoms

## 2017-07-09 NOTE — ED Triage Notes (Signed)
Pt states he ate a biscuit and felt like a piece of this tooth came off. Pt also c/o sore throat, cold symptmos, body aches.

## 2017-07-09 NOTE — ED Provider Notes (Signed)
[facility]   [id number] 07/09/17 Arrival Time: 1811  SUBJECTIVE:  Rolene ArbourJohnathan Ledgerwood is a 32 y.o. male who reports abrupt onset of right lower dental pain described as constant sharp and achy. Started for 4 days after he "broke" his tooth while eating a biscuit. Afebrile. Tolerating PO intake but reports pain with chewing. Normal swallowing. He does not see a dentist regularly. No neck swelling or pain. OTC analgesics without relief.  Patient also complains of rhinorrhea, congestion, and sore throat that began three days ago.  Denies fever, chills, nausea, and vomiting.    ROS: As per HPI.  History reviewed. No pertinent past medical history. History reviewed. No pertinent surgical history. No Known Allergies No current facility-administered medications on file prior to encounter.    Current Outpatient Medications on File Prior to Encounter  Medication Sig Dispense Refill  . fluticasone (FLONASE) 50 MCG/ACT nasal spray Place 1 spray into both nostrils daily. 16 g 2   Social History   Socioeconomic History  . Marital status: Single    Spouse name: Not on file  . Number of children: Not on file  . Years of education: Not on file  . Highest education level: Not on file  Occupational History  . Not on file  Social Needs  . Financial resource strain: Not on file  . Food insecurity:    Worry: Not on file    Inability: Not on file  . Transportation needs:    Medical: Not on file    Non-medical: Not on file  Tobacco Use  . Smoking status: Current Every Day Smoker    Types: Cigarettes, Cigars  . Smokeless tobacco: Never Used  . Tobacco comment: black and mild 2x/week  Substance and Sexual Activity  . Alcohol use: Yes    Comment: occ  . Drug use: No  . Sexual activity: Yes    Birth control/protection: Condom  Lifestyle  . Physical activity:    Days per week: Not on file    Minutes per session: Not on file  . Stress: Not on file  Relationships  . Social  connections:    Talks on phone: Not on file    Gets together: Not on file    Attends religious service: Not on file    Active member of club or organization: Not on file    Attends meetings of clubs or organizations: Not on file    Relationship status: Not on file  . Intimate partner violence:    Fear of current or ex partner: Not on file    Emotionally abused: Not on file    Physically abused: Not on file    Forced sexual activity: Not on file  Other Topics Concern  . Not on file  Social History Narrative  . Not on file   No family history on file.  OBJECTIVE:  Vitals:   07/09/17 1822  BP: 116/79  Pulse: 67  Resp: 18  Temp: 98.5 F (36.9 C)  SpO2: 99%    General appearance: alert; no distress HEENT: Head: normocephalic; atraumatic; Ears: EACs clear, TM visualized pearly gray with visible cone of light; Sinuses nontender to palpation.  Eyes: PERRL Nose: Minimal rhinorrhea; Mouth: dentition: fair; minimal swelling over left lower gums without areas of fluctuance; Right bottom molar with partial tooth fracture Throat: oropharynx clear, tonsils 1+ and mildly erythematous without white exudates; uvula midline Neck: Mild LAD Lungs: CTA bilaterally without adventitious breath sounds Skin: warm and dry Psychological: alert and cooperative; normal  mood and affect  ASSESSMENT & PLAN:  1. Pain, dental   2. Viral URI     Meds ordered this encounter  Medications  . amoxicillin-clavulanate (AUGMENTIN) 875-125 MG tablet    Sig: Take 1 tablet by mouth every 12 (twelve) hours.    Dispense:  14 tablet    Refill:  0    Order Specific Question:   Supervising Provider    Answer:   Isa Rankin [phone]  . cetirizine-pseudoephedrine (ZYRTEC-D) 5-120 MG tablet    Sig: Take 1 tablet by mouth daily.    Dispense:  20 tablet    Refill:  0    Order Specific Question:   Supervising Provider    Answer:   Isa Rankin [phone]  . lidocaine (XYLOCAINE) 2 % solution    Sig:  Use as directed 15 mLs in the mouth or throat every 6 (six) hours as needed for mouth pain.    Dispense:  100 mL    Refill:  0    Order Specific Question:   Supervising Provider    Answer:   Isa Rankin [[id number]]   Get plenty of rest and push fluids Zyrtec-D prescribed Augmentin prescribed Viscous lidocaine prescribed Take medications as directed and to completion Use OTC medication as needed for symptomatic relief Follow up with PCP if symptoms persist Return or go to ER if you have any new or worsening symptoms    Reviewed expectations re: course of current medical issues. Questions answered. Outlined signs and symptoms indicating need for more acute intervention. Patient verbalized understanding. After Visit Summary given.   Rennis Harding, PA-C 07/09/17 1922

## 2017-10-19 ENCOUNTER — Encounter (HOSPITAL_COMMUNITY): Payer: Self-pay | Admitting: Emergency Medicine

## 2017-10-19 ENCOUNTER — Other Ambulatory Visit: Payer: Self-pay

## 2017-10-19 ENCOUNTER — Ambulatory Visit (HOSPITAL_COMMUNITY)
Admission: EM | Admit: 2017-10-19 | Discharge: 2017-10-19 | Disposition: A | Discharge status: Home / Self Care | Payer: BLUE CROSS/BLUE SHIELD | Attending: Family Medicine | Admitting: Family Medicine

## 2017-10-19 DIAGNOSIS — Z202 Contact with and (suspected) exposure to infections with a predominantly sexual mode of transmission: Secondary | ICD-10-CM

## 2017-10-19 DIAGNOSIS — R300 Dysuria: Secondary | ICD-10-CM

## 2017-10-19 DIAGNOSIS — Z113 Encounter for screening for infections with a predominantly sexual mode of transmission: Secondary | ICD-10-CM

## 2017-10-19 DIAGNOSIS — F17210 Nicotine dependence, cigarettes, uncomplicated: Secondary | ICD-10-CM | POA: Insufficient documentation

## 2017-10-19 DIAGNOSIS — R369 Urethral discharge, unspecified: Secondary | ICD-10-CM | POA: Diagnosis present

## 2017-10-19 LAB — POCT URINALYSIS DIP (DEVICE)
Bilirubin Urine: NEGATIVE
GLUCOSE, UA: NEGATIVE mg/dL
Hgb urine dipstick: NEGATIVE
Ketones, ur: NEGATIVE mg/dL
LEUKOCYTES UA: NEGATIVE
Nitrite: NEGATIVE
Protein, ur: NEGATIVE mg/dL
SPECIFIC GRAVITY, URINE: 1.02 (ref 1.005–1.030)
UROBILINOGEN UA: 0.2 mg/dL (ref 0.0–1.0)
pH: 7 (ref 5.0–8.0)

## 2017-10-19 MED ORDER — CEFTRIAXONE SODIUM 250 MG IJ SOLR
INTRAMUSCULAR | Status: AC
  Filled 2017-10-19: qty 250

## 2017-10-19 MED ORDER — CEFTRIAXONE SODIUM 250 MG IJ SOLR
250.0000 mg | Freq: Once | INTRAMUSCULAR | Status: AC
  Administered 2017-10-19: 250 mg via INTRAMUSCULAR

## 2017-10-19 MED ORDER — AZITHROMYCIN 250 MG PO TABS
ORAL_TABLET | ORAL | Status: AC
  Filled 2017-10-19: qty 4

## 2017-10-19 MED ORDER — AZITHROMYCIN 250 MG PO TABS
1000.0000 mg | ORAL_TABLET | Freq: Once | ORAL | Status: AC
  Administered 2017-10-19: 1000 mg via ORAL

## 2017-10-19 NOTE — ED Provider Notes (Signed)
[facility]   [id number] 10/19/17 Arrival Time: 0945  ASSESSMENT & PLAN:  1. Penile discharge      Discharge Instructions     You have been given the following medications today for treatment of suspected gonorrhea and/or chlamydia:  cefTRIAXone (ROCEPHIN) injection 250 mg azithromycin (ZITHROMAX) tablet 1,000 mg  Even though we have treated you today, we have sent testing for sexually transmitted infections. We will notify you of any positive results once they are received. If required, we will prescribe any medications you might need.  Please refrain from all sexual activity for at least the next seven days.    Pending: Labs Reviewed  POCT URINALYSIS DIP (DEVICE)  URINE CYTOLOGY ANCILLARY ONLY    Will notify of any positive results. Instructed to refrain from sexual activity for at least seven days.  Reviewed expectations re: course of current medical issues. Questions answered. Outlined signs and symptoms indicating need for more acute intervention. Patient verbalized understanding. After Visit Summary given.   SUBJECTIVE:  Danny Mccoy is a 32 y.o. male who presents with complaint of penile discharge. Onset gradual, 2 days ago. Describes discharge as thick and white/yellow. Urinary symptoms: "maybe a little burning when I pee". Afebrile. No abdominal or pelvic pain. No n/v. No rashes or lesions. Sexually active with single male partner. OTC treatment: none. History of similar symptoms: reports h/o treated STD in the past.   ROS: As per HPI.  OBJECTIVE:  Vitals:   10/19/17 1010  BP: 114/67  Pulse: (!) 56  Temp: 98.3 F (36.8 C)  TempSrc: Oral  SpO2: 99%    General appearance: alert, cooperative, appears stated age and no distress Throat: lips, mucosa, and tongue normal; teeth and gums normal Back: no CVA tenderness Abdomen: soft, non-tender Skin: warm and dry Psychological: alert and cooperative. Normal mood and affect.  Results for  orders placed or performed during the hospital encounter of 10/19/17  POCT urinalysis dip (device)  Result Value Ref Range   Glucose, UA NEGATIVE NEGATIVE mg/dL   Bilirubin Urine NEGATIVE NEGATIVE   Ketones, ur NEGATIVE NEGATIVE mg/dL   Specific Gravity, Urine 1.020 1.005 - 1.030   Hgb urine dipstick NEGATIVE NEGATIVE   pH 7.0 5.0 - 8.0   Protein, ur NEGATIVE NEGATIVE mg/dL   Urobilinogen, UA 0.2 0.0 - 1.0 mg/dL   Nitrite NEGATIVE NEGATIVE   Leukocytes, UA NEGATIVE NEGATIVE    Labs Reviewed  POCT URINALYSIS DIP (DEVICE)  URINE CYTOLOGY ANCILLARY ONLY    No Known Allergies  Social History   Socioeconomic History  . Marital status: Single    Spouse name: Not on file  . Number of children: Not on file  . Years of education: Not on file  . Highest education level: Not on file  Occupational History  . Not on file  Social Needs  . Financial resource strain: Not on file  . Food insecurity:    Worry: Not on file    Inability: Not on file  . Transportation needs:    Medical: Not on file    Non-medical: Not on file  Tobacco Use  . Smoking status: Current Every Day Smoker    Types: Cigarettes, Cigars  . Smokeless tobacco: Never Used  . Tobacco comment: black and mild 2x/week  Substance and Sexual Activity  . Alcohol use: Yes    Comment: occ  . Drug use: No  . Sexual activity: Yes    Birth control/protection: Condom  Lifestyle  . Physical activity:  Days per week: Not on file    Minutes per session: Not on file  . Stress: Not on file  Relationships  . Social connections:    Talks on phone: Not on file    Gets together: Not on file    Attends religious service: Not on file    Active member of club or organization: Not on file    Attends meetings of clubs or organizations: Not on file    Relationship status: Not on file  . Intimate partner violence:    Fear of current or ex partner: Not on file    Emotionally abused: Not on file    Physically abused: Not on file     Forced sexual activity: Not on file  Other Topics Concern  . Not on file  Social History Narrative  . Not on file          Mardella Layman, MD 10/19/17 1042

## 2017-10-19 NOTE — Discharge Instructions (Signed)

## 2017-10-19 NOTE — ED Triage Notes (Signed)
Pt reports penile discharge with a slight odor and burning with urination. Pt denies any other symptoms.

## 2017-10-20 LAB — URINE CYTOLOGY ANCILLARY ONLY
Chlamydia: NEGATIVE
Neisseria Gonorrhea: NEGATIVE
TRICH (WINDOWPATH): NEGATIVE

## 2018-01-18 ENCOUNTER — Other Ambulatory Visit: Payer: Self-pay

## 2018-01-18 ENCOUNTER — Encounter (HOSPITAL_COMMUNITY): Payer: Self-pay | Admitting: Emergency Medicine

## 2018-01-18 ENCOUNTER — Ambulatory Visit (HOSPITAL_COMMUNITY)
Admission: EM | Admit: 2018-01-18 | Discharge: 2018-01-18 | Disposition: A | Discharge status: Home / Self Care | Payer: BLUE CROSS/BLUE SHIELD | Attending: Emergency Medicine | Admitting: Emergency Medicine

## 2018-01-18 DIAGNOSIS — Z7951 Long term (current) use of inhaled steroids: Secondary | ICD-10-CM | POA: Insufficient documentation

## 2018-01-18 DIAGNOSIS — Z202 Contact with and (suspected) exposure to infections with a predominantly sexual mode of transmission: Secondary | ICD-10-CM | POA: Insufficient documentation

## 2018-01-18 DIAGNOSIS — F17210 Nicotine dependence, cigarettes, uncomplicated: Secondary | ICD-10-CM | POA: Diagnosis not present

## 2018-01-18 DIAGNOSIS — J039 Acute tonsillitis, unspecified: Secondary | ICD-10-CM

## 2018-01-18 DIAGNOSIS — Z79899 Other long term (current) drug therapy: Secondary | ICD-10-CM | POA: Insufficient documentation

## 2018-01-18 DIAGNOSIS — F17290 Nicotine dependence, other tobacco product, uncomplicated: Secondary | ICD-10-CM | POA: Diagnosis not present

## 2018-01-18 DIAGNOSIS — R369 Urethral discharge, unspecified: Secondary | ICD-10-CM

## 2018-01-18 MED ORDER — AMOXICILLIN-POT CLAVULANATE 875-125 MG PO TABS: 1.0000 | ORAL_TABLET | Freq: Two times a day (BID) | ORAL | 0 refills | Status: AC

## 2018-01-18 MED ORDER — CEFTRIAXONE SODIUM 250 MG IJ SOLR
250.0000 mg | Freq: Once | INTRAMUSCULAR | Status: AC
  Administered 2018-01-18: 250 mg via INTRAMUSCULAR

## 2018-01-18 MED ORDER — AZITHROMYCIN 250 MG PO TABS
ORAL_TABLET | ORAL | Status: AC
  Filled 2018-01-18: qty 4

## 2018-01-18 MED ORDER — AZITHROMYCIN 250 MG PO TABS
1000.0000 mg | ORAL_TABLET | Freq: Once | ORAL | Status: AC
  Administered 2018-01-18: 1000 mg via ORAL

## 2018-01-18 MED ORDER — CEFTRIAXONE SODIUM 250 MG IJ SOLR
INTRAMUSCULAR | Status: AC
  Filled 2018-01-18: qty 250

## 2018-01-18 NOTE — ED Provider Notes (Signed)
[facility]    CSN: [id number] Arrival date & time: 01/18/18  1324     History   Chief Complaint Chief Complaint  Patient presents with  . Exposure to STD  . Sore Throat    HPI Danny Mccoy is a 32 y.o. male no significant past medical history presents today for evaluation of sore throat and penile discharge.  Patient states that over the past 3 days he has had penile discharge.  This occurred after having intercourse with a new partner.  Is also had some discomfort with urination.  Denies any rashes or lesions.  Denies scrotal swelling or testicular pain.  He is also had a sore throat over the past couple days as well.  He denies associated congestion or cough.  Denies fevers.  Decreased oral intake due to pain in his neck.  HPI  History reviewed. No pertinent past medical history.  Patient Active Problem List   Diagnosis Date Noted  . CONSTIPATION 06/30/2007    History reviewed. No pertinent surgical history.     Home Medications    Prior to Admission medications   Medication Sig Start Date End Date Taking? Authorizing Provider  amoxicillin-clavulanate (AUGMENTIN) 875-125 MG tablet Take 1 tablet by mouth every 12 (twelve) hours for 10 days. 01/18/18 01/28/18  Chyanne Kohut C, PA-C  cetirizine-pseudoephedrine (ZYRTEC-D) 5-120 MG tablet Take 1 tablet by mouth daily. 07/09/17   Wurst, [place], PA-C  fluticasone (FLONASE) 50 MCG/ACT nasal spray Place 1 spray into both nostrils daily. 04/12/17   Georgetta Haber, NP  lidocaine (XYLOCAINE) 2 % solution Use as directed 15 mLs in the mouth or throat every 6 (six) hours as needed for mouth pain. 07/09/17   Rennis Harding, PA-C    Family History History reviewed. No pertinent family history.  Social History Social History   Tobacco Use  . Smoking status: Current Every Day Smoker    Types: Cigarettes, Cigars  . Smokeless tobacco: Never Used  . Tobacco comment: black and mild 2x/week  Substance Use Topics    . Alcohol use: Yes    Comment: occ  . Drug use: No     Allergies   Patient has no known allergies.   Review of Systems Review of Systems  Constitutional: Negative for activity change, appetite change, chills, fatigue and fever.  HENT: Positive for sore throat. Negative for congestion, ear pain, rhinorrhea, sinus pressure and trouble swallowing.   Eyes: Negative for discharge and redness.  Respiratory: Negative for cough, chest tightness and shortness of breath.   Cardiovascular: Negative for chest pain.  Gastrointestinal: Negative for abdominal pain, diarrhea, nausea and vomiting.  Genitourinary: Positive for discharge and dysuria. Negative for difficulty urinating, frequency, penile pain, penile swelling, scrotal swelling and testicular pain.  Musculoskeletal: Negative for myalgias.  Skin: Negative for rash.  Neurological: Negative for dizziness, light-headedness and headaches.     Physical Exam Triage Vital Signs ED Triage Vitals  Enc Vitals Group     BP 01/18/18 1402 127/66     Pulse Rate 01/18/18 1402 (!) 59     Resp 01/18/18 1402 16     Temp 01/18/18 1402 98.4 F (36.9 C)     Temp Source 01/18/18 1402 Oral     SpO2 01/18/18 1402 100 %     Weight 01/18/18 1403 205 lb (93 kg)     Height --      Head Circumference --      Peak Flow --      Pain  Score 01/18/18 1403 8     Pain Loc --      Pain Edu? --      Excl. in GC? --    No data found.  Updated Vital Signs BP 127/66   Pulse (!) 59   Temp 98.4 F (36.9 C) (Oral)   Resp 16   Wt 205 lb (93 kg)   SpO2 100%   BMI 27.80 kg/m   Visual Acuity Right Eye Distance:   Left Eye Distance:   Bilateral Distance:    Right Eye Near:   Left Eye Near:    Bilateral Near:     Physical Exam  Constitutional: He appears well-developed and well-nourished.  HENT:  Head: Normocephalic and atraumatic.  Bilateral ears without tenderness to palpation of external auricle, tragus and mastoid, EAC's without erythema or  swelling, TM's with good bony landmarks and cone of light. Non erythematous.  Oral mucosa pink and moist, moderate tonsillar enlargement with erythema and white exudate bilaterally. Posterior pharynx patent and erythematous, no uvula deviation or swelling. Normal phonation.  Eyes: Conjunctivae are normal.  Neck: Neck supple.  Cardiovascular: Normal rate and regular rhythm.  No murmur heard. Pulmonary/Chest: Effort normal and breath sounds normal. No respiratory distress.  Breathing comfortably at rest, CTABL, no wheezing, rales or other adventitious sounds auscultated  Abdominal: Soft. There is no tenderness.  Genitourinary:  Genitourinary Comments: Deferred  Musculoskeletal: He exhibits no edema.  Neurological: He is alert.  Skin: Skin is warm and dry.  Psychiatric: He has a normal mood and affect.  Nursing note and vitals reviewed.    UC Treatments / Results  Labs (all labs ordered are listed, but only abnormal results are displayed) Labs Reviewed  URINE CYTOLOGY ANCILLARY ONLY    EKG None  Radiology No results found.  Procedures Procedures (including critical care time)  Medications Ordered in UC Medications  cefTRIAXone (ROCEPHIN) injection 250 mg (250 mg Intramuscular Given 01/18/18 1451)  azithromycin (ZITHROMAX) tablet 1,000 mg (1,000 mg Oral Given 01/18/18 1451)    Initial Impression / Assessment and Plan / UC Course  I have reviewed the triage vital signs and the nursing notes.  Pertinent labs & imaging results that were available during my care of the patient were reviewed by me and considered in my medical decision making (see chart for details).     Patient declined strep test.  Given appearance of tonsils will treat for tonsillitis with Augmentin.  Discussed further symptomatic management of sore throat.  Patient with symptoms for STDs, will treat with Rocephin and azithromycin in clinic prior to discharge.  Urine cytology obtained and will send off to  check for STDs, will call patient with results and provide any further treatment if needed.Discussed strict return precautions. Patient verbalized understanding and is agreeable with plan.  Final Clinical Impressions(s) / UC Diagnoses   Final diagnoses:  Acute tonsillitis, unspecified etiology  Penile discharge     Discharge Instructions     We have treated you today for gonorrhea and chlamydia, with Rocephin and azithromycin. Please refrain from sexual activity for 7 days while medicine is clearing infection.  We are testing you for Gonorrhea, Chlamydia and Trichomonas. We will call you if anything is positive and let you know if you require any further treatment. Please inform partner of any positive results.  Please return if symptoms not improving with treatment, development of fever, nausea, vomiting, abdominal pain, scrotal pain.  Sore Throat  Please been taking Augmentin twice daily for the  next 10 days to treat for tonsillitis  Please continue Tylenol or Ibuprofen for fever and pain. May try salt water gargles, cepacol lozenges, throat spray, or OTC cold relief medicine for throat discomfort. If you also have congestion take a daily anti-histamine like Zyrtec, Claritin, and a oral decongestant to help with post nasal drip that may be irritating your throat.   Stay hydrated and drink plenty of fluids to keep your throat coated relieve irritation.    ED Prescriptions    Medication Sig Dispense Auth. Provider   amoxicillin-clavulanate (AUGMENTIN) 875-125 MG tablet Take 1 tablet by mouth every 12 (twelve) hours for 10 days. 20 tablet Aissatou Fronczak, [place] C, PA-C     Controlled Substance Prescriptions [place] Controlled Substance Registry consulted? Not Applicable   Lew DawesWieters, Salvador [place] C, [place] JerseyPA-C 01/18/18 1505

## 2018-01-18 NOTE — Discharge Instructions (Signed)
We have treated you today for gonorrhea and chlamydia, with Rocephin and azithromycin. Please refrain from sexual activity for 7 days while medicine is clearing infection.  We are testing you for Gonorrhea, Chlamydia and Trichomonas. We will call you if anything is positive and let you know if you require any further treatment. Please inform partner of any positive results.  Please return if symptoms not improving with treatment, development of fever, nausea, vomiting, abdominal pain, scrotal pain.  Sore Throat  Please been taking Augmentin twice daily for the next 10 days to treat for tonsillitis  Please continue Tylenol or Ibuprofen for fever and pain. May try salt water gargles, cepacol lozenges, throat spray, or OTC cold relief medicine for throat discomfort. If you also have congestion take a daily anti-histamine like Zyrtec, Claritin, and a oral decongestant to help with post nasal drip that may be irritating your throat.   Stay hydrated and drink plenty of fluids to keep your throat coated relieve irritation.

## 2018-01-18 NOTE — ED Triage Notes (Signed)
PT reports penile discharge for 1 day, sore throat for 2 days. No fever.

## 2018-01-19 LAB — URINE CYTOLOGY ANCILLARY ONLY
Chlamydia: NEGATIVE
Neisseria Gonorrhea: NEGATIVE
Trichomonas: NEGATIVE

## 2018-01-28 ENCOUNTER — Encounter (HOSPITAL_COMMUNITY): Payer: Self-pay

## 2018-01-28 ENCOUNTER — Ambulatory Visit (HOSPITAL_COMMUNITY)
Admission: EM | Admit: 2018-01-28 | Discharge: 2018-01-28 | Disposition: A | Discharge status: Home / Self Care | Payer: BLUE CROSS/BLUE SHIELD | Attending: Family Medicine | Admitting: Family Medicine

## 2018-01-28 DIAGNOSIS — F17210 Nicotine dependence, cigarettes, uncomplicated: Secondary | ICD-10-CM | POA: Insufficient documentation

## 2018-01-28 DIAGNOSIS — Z79899 Other long term (current) drug therapy: Secondary | ICD-10-CM | POA: Diagnosis not present

## 2018-01-28 DIAGNOSIS — R369 Urethral discharge, unspecified: Secondary | ICD-10-CM | POA: Insufficient documentation

## 2018-01-28 DIAGNOSIS — Z202 Contact with and (suspected) exposure to infections with a predominantly sexual mode of transmission: Secondary | ICD-10-CM | POA: Insufficient documentation

## 2018-01-28 LAB — POCT URINALYSIS DIP (DEVICE)
Bilirubin Urine: NEGATIVE
GLUCOSE, UA: NEGATIVE mg/dL
Ketones, ur: NEGATIVE mg/dL
Leukocytes, UA: NEGATIVE
NITRITE: NEGATIVE
PROTEIN: NEGATIVE mg/dL
Specific Gravity, Urine: 1.025 (ref 1.005–1.030)
UROBILINOGEN UA: 1 mg/dL (ref 0.0–1.0)
pH: 6.5 (ref 5.0–8.0)

## 2018-01-28 MED ORDER — AZITHROMYCIN 250 MG PO TABS
1000.0000 mg | ORAL_TABLET | Freq: Once | ORAL | Status: AC
  Administered 2018-01-28: 1000 mg via ORAL

## 2018-01-28 MED ORDER — CEFTRIAXONE SODIUM 250 MG IJ SOLR
INTRAMUSCULAR | Status: AC
  Filled 2018-01-28: qty 250

## 2018-01-28 MED ORDER — LIDOCAINE HCL (PF) 1 % IJ SOLN
2.0000 mL | Freq: Once | INTRAMUSCULAR | Status: AC
  Administered 2018-01-28: 2 mL

## 2018-01-28 MED ORDER — LIDOCAINE HCL (PF) 1 % IJ SOLN
INTRAMUSCULAR | Status: AC
  Filled 2018-01-28: qty 2

## 2018-01-28 MED ORDER — AZITHROMYCIN 250 MG PO TABS
ORAL_TABLET | ORAL | Status: AC
  Filled 2018-01-28: qty 4

## 2018-01-28 MED ORDER — CEFTRIAXONE SODIUM 250 MG IJ SOLR
250.0000 mg | Freq: Once | INTRAMUSCULAR | Status: AC
  Administered 2018-01-28: 250 mg via INTRAMUSCULAR

## 2018-01-28 NOTE — ED Provider Notes (Signed)
[facility]    CSN: [id number] Arrival date & time: 01/28/18  1938     History   Chief Complaint Chief Complaint  Patient presents with  . Exposure to STD    HPI Danny Mccoy is a 32 y.o. male.   He is presenting with discharge from his penis.  This is nonpainful.  He reports having improvement of his symptoms when he is provided the Augmentin on 12/10.  He had unprotected intercourse with the same partner.  He denies any rashes.  He denies any dysuria.  HPI  History reviewed. No pertinent past medical history.  Patient Active Problem List   Diagnosis Date Noted  . CONSTIPATION 06/30/2007    History reviewed. No pertinent surgical history.     Home Medications    Prior to Admission medications   Medication Sig Start Date End Date Taking? Authorizing Provider  amoxicillin-clavulanate (AUGMENTIN) 875-125 MG tablet Take 1 tablet by mouth every 12 (twelve) hours for 10 days. 01/18/18 01/28/18  Wieters, Hallie C, PA-C  cetirizine-pseudoephedrine (ZYRTEC-D) 5-120 MG tablet Take 1 tablet by mouth daily. 07/09/17   Wurst, [place], PA-C  fluticasone (FLONASE) 50 MCG/ACT nasal spray Place 1 spray into both nostrils daily. 04/12/17   Georgetta HaberBurky, Natalie B, NP  lidocaine (XYLOCAINE) 2 % solution Use as directed 15 mLs in the mouth or throat every 6 (six) hours as needed for mouth pain. 07/09/17   Rennis HardingWurst, Brittany, PA-C    Family History History reviewed. No pertinent family history.  Social History Social History   Tobacco Use  . Smoking status: Current Every Day Smoker    Types: Cigarettes, Cigars  . Smokeless tobacco: Never Used  . Tobacco comment: black and mild 2x/week  Substance Use Topics  . Alcohol use: Yes    Comment: occ  . Drug use: No     Allergies   Patient has no known allergies.   Review of Systems Review of Systems  Constitutional: Negative for fever.  HENT: Negative for congestion.   Respiratory: Negative for cough.   Cardiovascular:  Negative for chest pain.  Gastrointestinal: Negative for abdominal pain.  Genitourinary: Positive for discharge.  Musculoskeletal: Negative for arthralgias.  Skin: Negative for color change.  Neurological: Negative for weakness.  Hematological: Negative for adenopathy.  Psychiatric/Behavioral: Negative for agitation.     Physical Exam Triage Vital Signs ED Triage Vitals  Enc Vitals Group     BP 01/28/18 2003 117/65     Pulse Rate 01/28/18 2003 63     Resp 01/28/18 2003 16     Temp 01/28/18 2003 98.4 F (36.9 C)     Temp Source 01/28/18 2003 Oral     SpO2 01/28/18 2003 100 %     Weight --      Height --      Head Circumference --      Peak Flow --      Pain Score 01/28/18 2005 0     Pain Loc --      Pain Edu? --      Excl. in GC? --    No data found.  Updated Vital Signs BP 117/65 (BP Location: Right Arm)   Pulse 63   Temp 98.4 F (36.9 C) (Oral)   Resp 16   SpO2 100%   Visual Acuity Right Eye Distance:   Left Eye Distance:   Bilateral Distance:    Right Eye Near:   Left Eye Near:    Bilateral Near:  Physical Exam Gen: NAD, alert, cooperative with exam, well-appearing ENT: normal lips, normal nasal mucosa,  Eye: normal EOM, normal conjunctiva and lids CV:  no edema, +2 pedal pulses   Resp: no accessory muscle use, non-labored,  GI: no masses or tenderness, no hernia  Skin: no rashes, no areas of induration  Neuro: normal tone, normal sensation to touch Psych:  normal insight, alert and oriented MSK: normal gait, normal strength   UC Treatments / Results  Labs (all labs ordered are listed, but only abnormal results are displayed) Labs Reviewed  POCT URINALYSIS DIP (DEVICE) - Abnormal; Notable for the following components:      Result Value   Hgb urine dipstick TRACE (*)    All other components within normal limits  URINE CYTOLOGY ANCILLARY ONLY    EKG None  Radiology No results found.  Procedures Procedures (including critical care  time)  Medications Ordered in UC Medications  azithromycin (ZITHROMAX) tablet 1,000 mg (1,000 mg Oral Given 01/28/18 2022)  cefTRIAXone (ROCEPHIN) injection 250 mg (250 mg Intramuscular Given 01/28/18 2028)  lidocaine (PF) (XYLOCAINE) 1 % injection 2 mL (2 mLs Other Given 01/28/18 2028)    Initial Impression / Assessment and Plan / UC Course  I have reviewed the triage vital signs and the nursing notes.  Pertinent labs & imaging results that were available during my care of the patient were reviewed by me and considered in my medical decision making (see chart for details).     Danny Mccoy is a 32 year old male that is presenting with penile discharge.  His cytology was normal on 12/10.  Could be a sterile uveitis.  Has had recurrent symptoms and has been associated with unprotected intercourse with similar partner.  Will take urine cytology.  Will treat with azithromycin and ceftriaxone today.  Counseled on safe sex practices.  Counseled on supportive care.  Given indications to follow-up.  Final Clinical Impressions(s) / UC Diagnoses   Final diagnoses:  Penile discharge     Discharge Instructions     Please refrain from sexual intercourse Please ensure that you follow up if you have any continuing symptoms      ED Prescriptions    None     Controlled Substance Prescriptions [place] Controlled Substance Registry consulted? Not Applicable   Myra RudeSchmitz, Kaitlin Ardito E, MD 01/28/18 2103

## 2018-01-28 NOTE — ED Notes (Signed)
Patient able to ambulate independently  

## 2018-01-28 NOTE — Discharge Instructions (Signed)
Please refrain from sexual intercourse Please ensure that you follow up if you have any continuing symptoms

## 2018-01-28 NOTE — ED Triage Notes (Signed)
Pt present penis discharge with burning for over two weeks.

## 2018-01-31 LAB — URINE CYTOLOGY ANCILLARY ONLY
CHLAMYDIA, DNA PROBE: NEGATIVE
NEISSERIA GONORRHEA: NEGATIVE
TRICH (WINDOWPATH): NEGATIVE

## 2018-02-03 ENCOUNTER — Telehealth (HOSPITAL_COMMUNITY): Payer: Self-pay | Admitting: Emergency Medicine

## 2018-02-03 NOTE — Telephone Encounter (Signed)
Results are within normal range. Pt contacted and made aware. Attempted call no answer

## 2018-03-31 ENCOUNTER — Other Ambulatory Visit: Payer: Self-pay

## 2018-03-31 ENCOUNTER — Encounter (HOSPITAL_COMMUNITY): Payer: Self-pay | Admitting: Emergency Medicine

## 2018-03-31 ENCOUNTER — Ambulatory Visit (HOSPITAL_COMMUNITY)
Admission: EM | Admit: 2018-03-31 | Discharge: 2018-03-31 | Disposition: A | Discharge status: Home / Self Care | Payer: BLUE CROSS/BLUE SHIELD | Attending: Family Medicine | Admitting: Family Medicine

## 2018-03-31 DIAGNOSIS — Z113 Encounter for screening for infections with a predominantly sexual mode of transmission: Secondary | ICD-10-CM

## 2018-03-31 DIAGNOSIS — Z202 Contact with and (suspected) exposure to infections with a predominantly sexual mode of transmission: Secondary | ICD-10-CM

## 2018-03-31 DIAGNOSIS — R369 Urethral discharge, unspecified: Secondary | ICD-10-CM | POA: Diagnosis not present

## 2018-03-31 DIAGNOSIS — R300 Dysuria: Secondary | ICD-10-CM

## 2018-03-31 MED ORDER — AZITHROMYCIN 250 MG PO TABS
ORAL_TABLET | ORAL | Status: AC
  Filled 2018-03-31: qty 4

## 2018-03-31 MED ORDER — CEFTRIAXONE SODIUM 250 MG IJ SOLR
250.0000 mg | Freq: Once | INTRAMUSCULAR | Status: AC
  Administered 2018-03-31: 250 mg via INTRAMUSCULAR

## 2018-03-31 MED ORDER — STERILE WATER FOR INJECTION IJ SOLN
INTRAMUSCULAR | Status: AC
  Filled 2018-03-31: qty 10

## 2018-03-31 MED ORDER — AZITHROMYCIN 250 MG PO TABS
1000.0000 mg | ORAL_TABLET | Freq: Once | ORAL | Status: AC
  Administered 2018-03-31: 1000 mg via ORAL

## 2018-03-31 MED ORDER — CEFTRIAXONE SODIUM 250 MG IJ SOLR
INTRAMUSCULAR | Status: AC
  Filled 2018-03-31: qty 250

## 2018-03-31 NOTE — Discharge Instructions (Signed)
We are treating you prophylactically today for possibility of gonorrhea and chlamydia We are sending your urine for testing and will call with any positive results

## 2018-03-31 NOTE — ED Provider Notes (Signed)
[facility]    CSN: [id number] Arrival date & time: 03/31/18  3235     History   Chief Complaint Chief Complaint  Patient presents with  . SEXUALLY TRANSMITTED DISEASE    HPI Danny Mccoy is a 33 y.o. male.   Patient is a 33 year old male who presents with penile discharge and dysuria.  Reports that he had unprotected sex 2 days ago the symptoms started yesterday.  Symptoms have been constant.  He is here with concerns for STD.  He denies any associated fever, chills, abdominal pain, back pain.  ROS per HPI      History reviewed. No pertinent past medical history.  Patient Active Problem List   Diagnosis Date Noted  . CONSTIPATION 06/30/2007    History reviewed. No pertinent surgical history.     Home Medications    Prior to Admission medications   Medication Sig Start Date End Date Taking? Authorizing Provider  cetirizine-pseudoephedrine (ZYRTEC-D) 5-120 MG tablet Take 1 tablet by mouth daily. 07/09/17   Wurst, [place], PA-C  fluticasone (FLONASE) 50 MCG/ACT nasal spray Place 1 spray into both nostrils daily. 04/12/17   Georgetta Haber, NP  lidocaine (XYLOCAINE) 2 % solution Use as directed 15 mLs in the mouth or throat every 6 (six) hours as needed for mouth pain. 07/09/17   Rennis Harding, PA-C    Family History History reviewed. No pertinent family history.  Social History Social History   Tobacco Use  . Smoking status: Current Every Day Smoker    Types: Cigarettes, Cigars  . Smokeless tobacco: Never Used  . Tobacco comment: black and mild 2x/week  Substance Use Topics  . Alcohol use: Yes    Comment: occ  . Drug use: No     Allergies   Patient has no known allergies.   Review of Systems Review of Systems   Physical Exam Triage Vital Signs ED Triage Vitals  Enc Vitals Group     BP 03/31/18 0828 120/69     Pulse Rate 03/31/18 0828 63     Resp 03/31/18 0828 18     Temp 03/31/18 0828 98.7 F (37.1 C)     Temp Source  03/31/18 0828 Oral     SpO2 03/31/18 0828 98 %     Weight --      Height --      Head Circumference --      Peak Flow --      Pain Score 03/31/18 0826 6     Pain Loc --      Pain Edu? --      Excl. in GC? --    No data found.  Updated Vital Signs BP 120/69 (BP Location: Right Arm)   Pulse 63   Temp 98.7 F (37.1 C) (Oral)   Resp 18   SpO2 98%   Visual Acuity Right Eye Distance:   Left Eye Distance:   Bilateral Distance:    Right Eye Near:   Left Eye Near:    Bilateral Near:     Physical Exam Vitals signs and nursing note reviewed.  Constitutional:      General: He is not in acute distress.    Appearance: Normal appearance. He is well-developed. He is not ill-appearing, toxic-appearing or diaphoretic.  HENT:     Head: Normocephalic and atraumatic.     Mouth/Throat:     Pharynx: Oropharynx is clear.  Eyes:     Conjunctiva/sclera: Conjunctivae normal.  Neck:     Musculoskeletal: Normal  range of motion and neck supple.  Pulmonary:     Effort: Pulmonary effort is normal.  Abdominal:     Palpations: Abdomen is soft.     Tenderness: There is no abdominal tenderness.  Genitourinary:    Comments: Deferred Musculoskeletal: Normal range of motion.  Skin:    General: Skin is warm and dry.  Neurological:     Mental Status: He is alert.  Psychiatric:        Mood and Affect: Mood normal.      UC Treatments / Results  Labs (all labs ordered are listed, but only abnormal results are displayed) Labs Reviewed  URINE CYTOLOGY ANCILLARY ONLY    EKG None  Radiology No results found.  Procedures Procedures (including critical care time)  Medications Ordered in UC Medications  cefTRIAXone (ROCEPHIN) injection 250 mg (has no administration in time range)  azithromycin (ZITHROMAX) tablet 1,000 mg (has no administration in time range)    Initial Impression / Assessment and Plan / UC Course  I have reviewed the triage vital signs and the nursing  notes.  Pertinent labs & imaging results that were available during my care of the patient were reviewed by me and considered in my medical decision making (see chart for details).     Sending urine for cytology Treating prophylactically for gonorrhea and chlamydia based on symptoms and exposure Follow up as needed for continued or worsening symptoms  Final Clinical Impressions(s) / UC Diagnoses   Final diagnoses:  Penile discharge     Discharge Instructions     We are treating you prophylactically today for possibility of gonorrhea and chlamydia We are sending your urine for testing and will call with any positive results    ED Prescriptions    None     Controlled Substance Prescriptions [place] Controlled Substance Registry consulted? Not Applicable   Janace Aris, NP 03/31/18 [phone]

## 2018-03-31 NOTE — ED Triage Notes (Signed)
Patient has penile discharge and burning that started last night, history of the same

## 2018-04-01 LAB — URINE CYTOLOGY ANCILLARY ONLY
Chlamydia: NEGATIVE
NEISSERIA GONORRHEA: NEGATIVE
TRICH (WINDOWPATH): NEGATIVE

## 2018-04-08 ENCOUNTER — Emergency Department (HOSPITAL_COMMUNITY)
Admission: EM | Admit: 2018-04-08 | Discharge: 2018-04-08 | Disposition: A | Discharge status: Home / Self Care | Payer: BLUE CROSS/BLUE SHIELD | Attending: Emergency Medicine | Admitting: Emergency Medicine

## 2018-04-08 ENCOUNTER — Other Ambulatory Visit: Payer: Self-pay

## 2018-04-08 ENCOUNTER — Encounter (HOSPITAL_COMMUNITY): Payer: Self-pay | Admitting: Emergency Medicine

## 2018-04-08 DIAGNOSIS — F17210 Nicotine dependence, cigarettes, uncomplicated: Secondary | ICD-10-CM | POA: Insufficient documentation

## 2018-04-08 DIAGNOSIS — R369 Urethral discharge, unspecified: Secondary | ICD-10-CM | POA: Diagnosis not present

## 2018-04-08 DIAGNOSIS — Z202 Contact with and (suspected) exposure to infections with a predominantly sexual mode of transmission: Secondary | ICD-10-CM

## 2018-04-08 DIAGNOSIS — Z79899 Other long term (current) drug therapy: Secondary | ICD-10-CM | POA: Diagnosis not present

## 2018-04-08 LAB — URINALYSIS, ROUTINE W REFLEX MICROSCOPIC
BILIRUBIN URINE: NEGATIVE
GLUCOSE, UA: NEGATIVE mg/dL
HGB URINE DIPSTICK: NEGATIVE
Ketones, ur: NEGATIVE mg/dL
NITRITE: NEGATIVE
Protein, ur: NEGATIVE mg/dL
Specific Gravity, Urine: 1.031 — ABNORMAL HIGH (ref 1.005–1.030)
pH: 5 (ref 5.0–8.0)

## 2018-04-08 MED ORDER — METRONIDAZOLE 500 MG PO TABS
2000.0000 mg | ORAL_TABLET | Freq: Once | ORAL | Status: AC
  Administered 2018-04-08: 2000 mg via ORAL
  Filled 2018-04-08: qty 4

## 2018-04-08 MED ORDER — AZITHROMYCIN 250 MG PO TABS
1000.0000 mg | ORAL_TABLET | Freq: Once | ORAL | Status: AC
  Administered 2018-04-08: 1000 mg via ORAL
  Filled 2018-04-08: qty 4

## 2018-04-08 MED ORDER — LIDOCAINE HCL (PF) 1 % IJ SOLN
INTRAMUSCULAR | Status: AC
  Administered 2018-04-08: 0.9 mL
  Filled 2018-04-08: qty 5

## 2018-04-08 MED ORDER — CEFTRIAXONE SODIUM 250 MG IJ SOLR
250.0000 mg | Freq: Once | INTRAMUSCULAR | Status: AC
  Administered 2018-04-08: 250 mg via INTRAMUSCULAR
  Filled 2018-04-08: qty 250

## 2018-04-08 NOTE — ED Notes (Signed)
Patient given discharge instructions and verbalized understanding.  Patient stable to discharge at this time.  Patient is alert and oriented to baseline.  No distressed noted at this time.  All belongings taken with the patient at discharge.   

## 2018-04-08 NOTE — ED Provider Notes (Signed)
[facility] EMERGENCY DEPARTMENT Provider Note   CSN: [id number] Arrival date & time: 04/08/18  1101    History   Chief Complaint Chief Complaint  Patient presents with  . Exposure to STD    HPI Danny Mccoy is a 33 y.o. male.     The history is provided by the patient and medical records. No language interpreter was used.  Exposure to STD  This is a recurrent problem. The current episode started more than 2 days ago. The problem occurs constantly. The problem has not changed since onset.Pertinent negatives include no chest pain, no abdominal pain, no headaches and no shortness of breath. Nothing aggravates the symptoms. Nothing relieves the symptoms. He has tried nothing for the symptoms. The treatment provided no relief.    No past medical history on file.  Patient Active Problem List   Diagnosis Date Noted  . CONSTIPATION 06/30/2007    No past surgical history on file.      Home Medications    Prior to Admission medications   Medication Sig Start Date End Date Taking? Authorizing Provider  cetirizine-pseudoephedrine (ZYRTEC-D) 5-120 MG tablet Take 1 tablet by mouth daily. 07/09/17   Wurst, [place], PA-C  fluticasone (FLONASE) 50 MCG/ACT nasal spray Place 1 spray into both nostrils daily. 04/12/17   Georgetta Haber, NP  lidocaine (XYLOCAINE) 2 % solution Use as directed 15 mLs in the mouth or throat every 6 (six) hours as needed for mouth pain. 07/09/17   Wurst, [place], PA-C    Family History No family history on file.  Social History Social History   Tobacco Use  . Smoking status: Current Every Day Smoker    Types: Cigarettes, Cigars  . Smokeless tobacco: Never Used  . Tobacco comment: black and mild 2x/week  Substance Use Topics  . Alcohol use: Yes    Comment: occ  . Drug use: No     Allergies   Patient has no known allergies.   Review of Systems Review of Systems  Constitutional: Negative for chills, diaphoresis, fatigue  and fever.  HENT: Negative for congestion.   Eyes: Negative for photophobia.  Respiratory: Negative for cough, chest tightness, shortness of breath and wheezing.   Cardiovascular: Negative for chest pain, palpitations and leg swelling.  Gastrointestinal: Negative for abdominal pain, constipation, diarrhea, nausea and vomiting.  Genitourinary: Positive for discharge. Negative for decreased urine volume, difficulty urinating, flank pain, penile pain, penile swelling, scrotal swelling, testicular pain and urgency.  Musculoskeletal: Negative for back pain, neck pain and neck stiffness.  Skin: Negative for rash and wound.  Neurological: Negative for weakness, light-headedness and headaches.  Psychiatric/Behavioral: Negative for agitation.  All other systems reviewed and are negative.    Physical Exam Updated Vital Signs BP 129/79 (BP Location: Right Arm)   Pulse 79   Temp 98.7 F (37.1 C) (Oral)   Resp 16   Ht 6' (1.829 m)   Wt 90.7 kg   SpO2 99%   BMI 27.12 kg/m   Physical Exam Vitals signs and nursing note reviewed.  Constitutional:      General: He is not in acute distress.    Appearance: He is well-developed. He is not ill-appearing, toxic-appearing or diaphoretic.  HENT:     Head: Normocephalic and atraumatic.     Nose: Nose normal. No congestion or rhinorrhea.     Mouth/Throat:     Pharynx: No oropharyngeal exudate or posterior oropharyngeal erythema.  Eyes:     Conjunctiva/sclera: Conjunctivae normal.  Pupils: Pupils are equal, round, and reactive to light.  Neck:     Musculoskeletal: Neck supple.  Cardiovascular:     Rate and Rhythm: Normal rate and regular rhythm.     Heart sounds: No murmur.  Pulmonary:     Effort: Pulmonary effort is normal. No respiratory distress.     Breath sounds: Normal breath sounds.  Abdominal:     General: Abdomen is flat. Bowel sounds are normal. There is no distension.     Palpations: Abdomen is soft.     Tenderness: There is no  abdominal tenderness. There is no right CVA tenderness, left CVA tenderness, guarding or rebound.     Hernia: No hernia is present.  Genitourinary:    Penis: Normal.      Scrotum/Testes: Normal.  Musculoskeletal:        General: No tenderness.  Skin:    General: Skin is warm and dry.     Capillary Refill: Capillary refill takes less than 2 seconds.  Neurological:     Mental Status: He is alert.  Psychiatric:        Mood and Affect: Mood normal.      ED Treatments / Results  Labs (all labs ordered are listed, but only abnormal results are displayed) Labs Reviewed  URINE CULTURE  URINALYSIS, ROUTINE W REFLEX MICROSCOPIC  GC/CHLAMYDIA PROBE AMP ([place]) NOT AT [facility]    EKG None  Radiology No results found.  Procedures Procedures (including critical care time)  Medications Ordered in ED Medications  azithromycin (ZITHROMAX) tablet 1,000 mg (has no administration in time range)  cefTRIAXone (ROCEPHIN) injection 250 mg (has no administration in time range)  metroNIDAZOLE (FLAGYL) tablet 2,000 mg (has no administration in time range)     Initial Impression / Assessment and Plan / ED Course  I have reviewed the triage vital signs and the nursing notes.  Pertinent labs & imaging results that were available during my care of the patient were reviewed by me and considered in my medical decision making (see chart for details).        Danny Mccoy is a 34 y.o. male with a past medical history significant for recurrent penile discharge who presents with recurrent penile discharge.  Patient reports that last week he was treated for STI with Rocephin and azithromycin at urgent care.  He reports that he abstain from intercourse for several days but after feeling much better he went back to a different partner.  He reports that he intermittently use condoms and has receptive oral sex.  He thinks he may have "caught another infection".  He says that this is happened numerous  times over the last few months.  He says he has not had a conversation with his partner that he has been getting treated for infections.  He was encouraged to ask his partner to be evaluated and treated so that he does not continue to catch infections.  He reports discharge and some discomfort in his penis with the discharge but denies any rashes or ulcers.  He denies any testicle pain.  Denies abdominal pain.  Denies fevers, chills, chest pain, shortness of breath, or other complaints.  He denies constant dysuria and only has the discomfort when he has the discharge.  No pain with ejaculation.  No recent trauma.  On exam, lungs clear chest nontender.  Abdomen nontender.  No flank or CVA tenderness.  Male chaperone was present and GU exam was unremarkable.  No penile tenderness, testicle tenderness, rash  or wounds seen.  No discharge present on my initial exam.  Exam otherwise unremarkable.  No hernias palpated.  Shared decision made conversation with patient we decided to get urinalysis, urine culture, and urine test for GC/chlamydia.  Patient will be treated not only with Rocephin and azithromycin as previously but also treated with Flagyl in case he has trichomonas that was missed.  Patient will follow-up with PCP and was instructed to tell his partners to be treated as well.  He voiced understanding of plan of care and will be discharged after medications.   Final Clinical Impressions(s) / ED Diagnoses   Final diagnoses:  Penile discharge  Possible exposure to STD    ED Discharge Orders    None      Clinical Impression: 1. Penile discharge   2. Possible exposure to STD     Disposition: Discharge  Condition: Good  I have discussed the results, Dx and Tx plan with the pt(& family if present). He/she/they expressed understanding and agree(s) with the plan. Discharge instructions discussed at great length. Strict return precautions discussed and pt &/or family have verbalized  understanding of the instructions. No further questions at time of discharge.    New Prescriptions   No medications on file    Follow Up: Billee Cashing, MD [address] [place] [address] [phone]     [facility] EMERGENCY DEPARTMENT [address] [id number] mc [place] [address] [phone]       , Canary Brim, MD 04/08/18 [phone]

## 2018-04-08 NOTE — ED Triage Notes (Signed)
Per pt he went to [facility] last week for and STD/STI check. Pt has discharge and they gave him rocephin and azithromycin.  He states this morning the stinging and discharge came back.  NAD noted at this time.

## 2018-04-08 NOTE — Discharge Instructions (Signed)
Your exam today was unremarkable however based on your recurrent episodes of penile discharge we are concerned you may be getting reinfected with a sexually transmitted infection.  We treated you with 3 antibiotics today to treat for possible infection.  Please follow-up with your PCP and inform your sexual partners that they need to be evaluated and treated as well so as not to get infected again.  Please abstain from intercourse for the next 2 weeks.  Any symptoms change or worsen, is return to the nearest emergency department.

## 2018-04-09 LAB — URINE CULTURE

## 2018-04-11 LAB — GC/CHLAMYDIA PROBE AMP (~~LOC~~) NOT AT ARMC
Chlamydia: NEGATIVE
Neisseria Gonorrhea: NEGATIVE

## 2018-06-18 ENCOUNTER — Ambulatory Visit (HOSPITAL_COMMUNITY)
Admission: EM | Admit: 2018-06-18 | Discharge: 2018-06-18 | Disposition: A | Discharge status: Home / Self Care | Payer: BLUE CROSS/BLUE SHIELD | Attending: Family Medicine | Admitting: Family Medicine

## 2018-06-18 ENCOUNTER — Encounter (HOSPITAL_COMMUNITY): Payer: Self-pay | Admitting: Emergency Medicine

## 2018-06-18 ENCOUNTER — Other Ambulatory Visit: Payer: Self-pay

## 2018-06-18 DIAGNOSIS — R369 Urethral discharge, unspecified: Secondary | ICD-10-CM | POA: Insufficient documentation

## 2018-06-18 MED ORDER — AZITHROMYCIN 250 MG PO TABS
ORAL_TABLET | ORAL | Status: AC
  Filled 2018-06-18: qty 4

## 2018-06-18 MED ORDER — CEFTRIAXONE SODIUM 250 MG IJ SOLR
INTRAMUSCULAR | Status: AC
  Filled 2018-06-18: qty 250

## 2018-06-18 MED ORDER — CEFTRIAXONE SODIUM 250 MG IJ SOLR
250.0000 mg | Freq: Once | INTRAMUSCULAR | Status: AC
  Administered 2018-06-18: 250 mg via INTRAMUSCULAR

## 2018-06-18 MED ORDER — AZITHROMYCIN 250 MG PO TABS
1000.0000 mg | ORAL_TABLET | Freq: Once | ORAL | Status: AC
  Administered 2018-06-18: 1000 mg via ORAL

## 2018-06-18 NOTE — Discharge Instructions (Signed)

## 2018-06-18 NOTE — ED Triage Notes (Signed)
Pt here for STD screening. 

## 2018-06-18 NOTE — ED Provider Notes (Signed)
[facility] CARE CENTER   [id number] 06/18/18 Arrival Time: 1008  ASSESSMENT & PLAN:  1. Penile discharge      Discharge Instructions     You have been given the following medications today for treatment of suspected gonorrhea and/or chlamydia:  cefTRIAXone (ROCEPHIN) injection 250 mg azithromycin (ZITHROMAX) tablet 1,000 mg  Even though we have treated you today, we have sent testing for sexually transmitted infections. We will notify you of any positive results once they are received. If required, we will prescribe any medications you might need.  Please refrain from all sexual activity for at least the next seven days.     Pending: Labs Reviewed  URINE CYTOLOGY ANCILLARY ONLY    Will notify of any positive results. Instructed to refrain from sexual activity for at least seven days.  Reviewed expectations re: course of current medical issues. Questions answered. Outlined signs and symptoms indicating need for more acute intervention. Patient verbalized understanding. After Visit Summary given.   SUBJECTIVE:  Danny Mccoy is a 33 y.o. male who reports potential of STI exposure. Unsure. Requests testing. Questions very slight penile discharge. Onset gradual. First noticed a few days ago. Describes discharge as thin and clear. Urinary symptoms: none. Afebrile. No abdominal or pelvic pain. No n/v. No rashes or lesions. Sexually active with partners; without regular condom use. Frequent visits for reported possible exposures to STI.  ROS: As per HPI. All other systems negative.   OBJECTIVE:  Vitals:   06/18/18 1032  BP: 115/78  Pulse: 66  Resp: 18  Temp: 98.1 F (36.7 C)  TempSrc: Oral  SpO2: 98%    General appearance: alert, cooperative, appears stated age and no distress Throat: lips, mucosa, and tongue normal; teeth and gums normal CV: RRR Lungs: CTAB Back: no CVA tenderness; FROM at waist Abdomen: soft, non-tender GU: deferred Skin: warm and dry  Psychological: alert and cooperative; normal mood and affect.   Labs Reviewed  URINE CYTOLOGY ANCILLARY ONLY    No Known Allergies  PMH: Urethritis.  Social History   Socioeconomic History  . Marital status: Single    Spouse name: Not on file  . Number of children: Not on file  . Years of education: Not on file  . Highest education level: Not on file  Occupational History  . Not on file  Social Needs  . Financial resource strain: Not on file  . Food insecurity:    Worry: Not on file    Inability: Not on file  . Transportation needs:    Medical: Not on file    Non-medical: Not on file  Tobacco Use  . Smoking status: Current Every Day Smoker    Types: Cigarettes, Cigars  . Smokeless tobacco: Never Used  . Tobacco comment: black and mild 2x/week  Substance and Sexual Activity  . Alcohol use: Yes    Comment: occ  . Drug use: No  . Sexual activity: Yes    Birth control/protection: Condom  Lifestyle  . Physical activity:    Days per week: Not on file    Minutes per session: Not on file  . Stress: Not on file  Relationships  . Social connections:    Talks on phone: Not on file    Gets together: Not on file    Attends religious service: Not on file    Active member of club or organization: Not on file    Attends meetings of clubs or organizations: Not on file    Relationship status: Not on  file  . Intimate partner violence:    Fear of current or ex partner: Not on file    Emotionally abused: Not on file    Physically abused: Not on file    Forced sexual activity: Not on file  Other Topics Concern  . Not on file  Social History Narrative  . Not on file          Mardella LaymanHagler, Mordche Hedglin, MD 06/28/18 0900

## 2018-06-20 LAB — URINE CYTOLOGY ANCILLARY ONLY
Chlamydia: NEGATIVE
Neisseria Gonorrhea: NEGATIVE
Trichomonas: NEGATIVE

## 2018-07-16 ENCOUNTER — Ambulatory Visit (HOSPITAL_COMMUNITY)
Admission: EM | Admit: 2018-07-16 | Discharge: 2018-07-16 | Disposition: A | Discharge status: Home / Self Care | Payer: BC Managed Care – PPO | Attending: Family Medicine | Admitting: Family Medicine

## 2018-07-16 ENCOUNTER — Encounter (HOSPITAL_COMMUNITY): Payer: Self-pay

## 2018-07-16 ENCOUNTER — Other Ambulatory Visit: Payer: Self-pay

## 2018-07-16 DIAGNOSIS — R369 Urethral discharge, unspecified: Secondary | ICD-10-CM | POA: Insufficient documentation

## 2018-07-16 MED ORDER — CEFTRIAXONE SODIUM 250 MG IJ SOLR
250.0000 mg | Freq: Once | INTRAMUSCULAR | Status: AC
  Administered 2018-07-16: 250 mg via INTRAMUSCULAR

## 2018-07-16 MED ORDER — AZITHROMYCIN 250 MG PO TABS
1000.0000 mg | ORAL_TABLET | Freq: Once | ORAL | Status: AC
  Administered 2018-07-16: 1000 mg via ORAL

## 2018-07-16 MED ORDER — AZITHROMYCIN 250 MG PO TABS
ORAL_TABLET | ORAL | Status: AC
  Filled 2018-07-16: qty 4

## 2018-07-16 MED ORDER — CEFTRIAXONE SODIUM 250 MG IJ SOLR
INTRAMUSCULAR | Status: AC
  Filled 2018-07-16: qty 250

## 2018-07-16 NOTE — ED Provider Notes (Signed)
[facility]   [id number] 07/16/18 Arrival Time: 1420  ASSESSMENT & PLAN:  1. Penile discharge    Declines HIV/RPR testing.   Discharge Instructions     You have been given the following medications today for treatment of suspected gonorrhea and/or chlamydia:  cefTRIAXone (ROCEPHIN) injection 250 mg azithromycin (ZITHROMAX) tablet 1,000 mg  Even though we have treated you today, we have sent testing for sexually transmitted infections. We will notify you of any positive results once they are received. If required, we will prescribe any medications you might need.  Please refrain from all sexual activity for at least the next seven days.    Pending: Labs Reviewed  URINE CYTOLOGY ANCILLARY ONLY    Will notify of any positive results. Instructed to refrain from sexual activity for at least seven days.  Reviewed expectations re: course of current medical issues. Questions answered. Outlined signs and symptoms indicating need for more acute intervention. Patient verbalized understanding. After Visit Summary given.   SUBJECTIVE:  Danny Mccoy is a 33 y.o. male who presents with complaint of penile discharge. Onset abrupt. First noticed 3-4 d ago. Describes discharge as thick and white/yellow. Urinary symptoms: none. Afebrile. No abdominal or pelvic pain. No n/v. No rashes or lesions. Sexually active with single male partner without regular condom use.  Was treated here less than one month ago for gonorrhea and chlamydia. Symptoms resolved quickly after treatment. Reports his partner was treated also.  ROS: As per HPI.  All other systems negative.   OBJECTIVE:  Vitals:   07/16/18 1503 07/16/18 1504  BP: 110/76   Pulse: 68   Resp: 16   Temp: 98.3 F (36.8 C)   TempSrc: Oral   SpO2: 97%   Weight:  86.2 kg     General appearance: alert, cooperative, appears stated age and no distress Throat: lips, mucosa, and tongue normal; teeth and gums normal CV:  RRR Lungs: CTAB Back: no CVA tenderness; FROM at waist Abdomen: soft, non-tender GU: deferred Skin: warm and dry Psychological: alert and cooperative; normal mood and affect.  Results for orders placed or performed during the hospital encounter of 06/18/18  Urine cytology ancillary only  Result Value Ref Range   Chlamydia Negative    Neisseria gonorrhea Negative    Trichomonas Negative     Labs Reviewed  URINE CYTOLOGY ANCILLARY ONLY    No Known Allergies  PMH: No chronic medical problems reported.  Social History   Socioeconomic History  . Marital status: Single    Spouse name: Not on file  . Number of children: Not on file  . Years of education: Not on file  . Highest education level: Not on file  Occupational History  . Not on file  Social Needs  . Financial resource strain: Not on file  . Food insecurity:    Worry: Not on file    Inability: Not on file  . Transportation needs:    Medical: Not on file    Non-medical: Not on file  Tobacco Use  . Smoking status: Current Every Day Smoker    Types: Cigarettes, Cigars  . Smokeless tobacco: Never Used  . Tobacco comment: black and mild 2x/week  Substance and Sexual Activity  . Alcohol use: Yes    Comment: occ  . Drug use: No  . Sexual activity: Yes    Birth control/protection: Condom  Lifestyle  . Physical activity:    Days per week: Not on file    Minutes per session: Not  on file  . Stress: Not on file  Relationships  . Social connections:    Talks on phone: Not on file    Gets together: Not on file    Attends religious service: Not on file    Active member of club or organization: Not on file    Attends meetings of clubs or organizations: Not on file    Relationship status: Not on file  . Intimate partner violence:    Fear of current or ex partner: Not on file    Emotionally abused: Not on file    Physically abused: Not on file    Forced sexual activity: Not on file  Other Topics Concern  . Not  on file  Social History Narrative  . Not on file          Vanessa Kick, MD 07/16/18 [phone]

## 2018-07-16 NOTE — Discharge Instructions (Signed)

## 2018-07-16 NOTE — ED Triage Notes (Signed)
Pt states he has penile discharge. X 3 days.

## 2018-07-18 LAB — URINE CYTOLOGY ANCILLARY ONLY
Chlamydia: NEGATIVE
Neisseria Gonorrhea: NEGATIVE

## 2018-08-10 ENCOUNTER — Encounter (HOSPITAL_COMMUNITY): Payer: Self-pay

## 2018-08-10 ENCOUNTER — Other Ambulatory Visit: Payer: Self-pay

## 2018-08-10 ENCOUNTER — Ambulatory Visit (HOSPITAL_COMMUNITY)
Admission: EM | Admit: 2018-08-10 | Discharge: 2018-08-10 | Disposition: A | Discharge status: Home / Self Care | Payer: BC Managed Care – PPO | Attending: Urgent Care | Admitting: Urgent Care

## 2018-08-10 DIAGNOSIS — R300 Dysuria: Secondary | ICD-10-CM | POA: Diagnosis present

## 2018-08-10 DIAGNOSIS — Z113 Encounter for screening for infections with a predominantly sexual mode of transmission: Secondary | ICD-10-CM | POA: Diagnosis not present

## 2018-08-10 DIAGNOSIS — R202 Paresthesia of skin: Secondary | ICD-10-CM | POA: Diagnosis not present

## 2018-08-10 DIAGNOSIS — R369 Urethral discharge, unspecified: Secondary | ICD-10-CM | POA: Diagnosis not present

## 2018-08-10 LAB — GLUCOSE, CAPILLARY: Glucose-Capillary: 68 mg/dL — ABNORMAL LOW (ref 70–99)

## 2018-08-10 MED ORDER — CEFTRIAXONE SODIUM 250 MG IJ SOLR
INTRAMUSCULAR | Status: AC
  Filled 2018-08-10: qty 250

## 2018-08-10 MED ORDER — AZITHROMYCIN 250 MG PO TABS
ORAL_TABLET | ORAL | Status: AC
  Filled 2018-08-10: qty 4

## 2018-08-10 MED ORDER — AZITHROMYCIN 250 MG PO TABS
1000.0000 mg | ORAL_TABLET | Freq: Once | ORAL | Status: AC
  Administered 2018-08-10: 1000 mg via ORAL

## 2018-08-10 MED ORDER — NAPROXEN 500 MG PO TABS: 500.0000 mg | ORAL_TABLET | Freq: Two times a day (BID) | ORAL | 0 refills | Status: DC

## 2018-08-10 MED ORDER — CEFTRIAXONE SODIUM 250 MG IJ SOLR
250.0000 mg | Freq: Once | INTRAMUSCULAR | Status: AC
  Administered 2018-08-10: 250 mg via INTRAMUSCULAR

## 2018-08-10 NOTE — ED Triage Notes (Signed)
Pt states he has pains in his hands this has been going on for 3 months with numbness at times. Pt states he needs to be tested for STD he has penile discharge x 2 days.

## 2018-08-10 NOTE — ED Provider Notes (Signed)
  MRN: [id number] DOB: [birth year]  Subjective:   Danny Mccoy is a 33 y.o. male presenting for 3 month history of bilateral hand tingling, numbness over his palms worse at night. 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 Has not used medications for help with his symptoms. Patient had a history of right wrist surgery from injury sustained while playing basketball.  He also reports several day history of penile discharge, dysuria.  States that he would like to get STI testing but not syphilis or HIV.  He does have unprotected sex.  Denies taking any chronic medications.   No Known Allergies  Denies chronic conditions.  Past surgical hx is for his right wrist.   Family history is positive for diabetes with his father.   ROS  Objective:   Vitals: BP 117/78 (BP Location: Right Arm)   Pulse 68   Temp 98.5 F (36.9 C) (Oral)   Resp 18   Wt 190 lb (86.2 kg)   SpO2 99%   BMI 25.77 kg/m   Physical Exam Constitutional:      Appearance: Normal appearance. He is well-developed and normal weight.  HENT:     Head: Normocephalic and atraumatic.     Right Ear: External ear normal.     Left Ear: External ear normal.     Nose: Nose normal.     Mouth/Throat:     Pharynx: Oropharynx is clear.  Eyes:     Extraocular Movements: Extraocular movements intact.     Pupils: Pupils are equal, round, and reactive to light.  Cardiovascular:     Rate and Rhythm: Normal rate.  Pulmonary:     Effort: Pulmonary effort is normal.  Musculoskeletal:     Comments: Negative Phalen's and Tinel's test bilaterally for CTS.  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  There is no frank swelling, erythema of his hands.  He does have dry skin over dorsal aspect.  Neurological:     Mental Status: He is alert and oriented to person, place, and time.  Psychiatric:        Mood and Affect: Mood normal.        Behavior:  Behavior normal.    Blood sugar is 68 by verbal report.   Assessment and Plan :   1. Paresthesia of both hands   2. Penile discharge   3. Dysuria    IM ceftriaxone and azithromycin in clinic for empiric treatment of STI, labs pending.  Recommended patient use naproxen and wrist splints at night for management of what I suspect is CTS related to the nature of his work.  Recommended patient establish care with the PCP at [facility] [address]. Counseled patient on potential for adverse effects with medications prescribed/recommended today, ER and return-to-clinic precautions discussed, patient verbalized understanding.    Jaynee Eagles, PA-C 08/10/18 1304

## 2018-08-11 ENCOUNTER — Telehealth (HOSPITAL_COMMUNITY): Payer: Self-pay | Admitting: Emergency Medicine

## 2018-08-11 LAB — URINE CULTURE: Culture: NO GROWTH

## 2018-08-11 LAB — URINE CYTOLOGY ANCILLARY ONLY
Chlamydia: NEGATIVE
Neisseria Gonorrhea: POSITIVE — AB
Trichomonas: NEGATIVE

## 2018-08-11 NOTE — Telephone Encounter (Signed)
Test for gonorrhea was positive. This was treated at the urgent care visit with IM rocephin 250mg and po zithromax 1g. Pt needs education to refrain from sexual intercourse for 7 days after treatment to give the medicine time to work. Sexual partners need to be notified and tested/treated. Condoms may reduce risk of reinfection. Recheck or followup with PCP for further evaluation if symptoms are not improving. [facility] notified.   Attempted to reach patient. No answer at this time. Voicemail left.     

## 2018-08-15 ENCOUNTER — Telehealth (HOSPITAL_COMMUNITY): Payer: Self-pay | Admitting: Emergency Medicine

## 2018-08-15 NOTE — Telephone Encounter (Signed)
Patient contacted and made aware of all results, all questions answered.  Pt states he needs to return for re treatment since he did not wait 7 days before having sexual contact.

## 2018-08-22 ENCOUNTER — Other Ambulatory Visit: Payer: Self-pay

## 2018-08-22 ENCOUNTER — Encounter (HOSPITAL_COMMUNITY): Payer: Self-pay | Admitting: Emergency Medicine

## 2018-08-22 ENCOUNTER — Ambulatory Visit (HOSPITAL_COMMUNITY)
Admission: EM | Admit: 2018-08-22 | Discharge: 2018-08-22 | Disposition: A | Discharge status: Home / Self Care | Payer: BC Managed Care – PPO | Attending: Family Medicine | Admitting: Family Medicine

## 2018-08-22 DIAGNOSIS — R300 Dysuria: Secondary | ICD-10-CM | POA: Insufficient documentation

## 2018-08-22 MED ORDER — CEFTRIAXONE SODIUM 250 MG IJ SOLR
INTRAMUSCULAR | Status: AC
  Filled 2018-08-22: qty 250

## 2018-08-22 MED ORDER — AZITHROMYCIN 250 MG PO TABS
ORAL_TABLET | ORAL | Status: AC
  Filled 2018-08-22: qty 4

## 2018-08-22 MED ORDER — AZITHROMYCIN 250 MG PO TABS
1000.0000 mg | ORAL_TABLET | Freq: Once | ORAL | Status: AC
  Administered 2018-08-22: 1000 mg via ORAL

## 2018-08-22 MED ORDER — CEFTRIAXONE SODIUM 250 MG IJ SOLR
250.0000 mg | Freq: Once | INTRAMUSCULAR | Status: AC
  Administered 2018-08-22: 250 mg via INTRAMUSCULAR

## 2018-08-22 MED ORDER — STERILE WATER FOR INJECTION IJ SOLN
INTRAMUSCULAR | Status: AC
  Filled 2018-08-22: qty 10

## 2018-08-22 NOTE — Discharge Instructions (Signed)
We are rechecking you for STDs Treating you again based on symptoms and possible reexposure.  Please refrain from sexual activity for 7 or more days to ensure the infection is gone.

## 2018-08-22 NOTE — ED Triage Notes (Signed)
Pt here for penile discharge and burning with urination.  Pt was treated here 11 days ago for gonorrhea.  Pt states he did not wait a week before having sexual contact again.

## 2018-08-22 NOTE — ED Provider Notes (Signed)
[place]    CSN: [id number] Arrival date & time: 08/22/18  1414     History   Chief Complaint Chief Complaint  Patient presents with  . Penile Discharge  . Dysuria    HPI Danny Mccoy is a 33 y.o. male.   Patient is a 33 year old male that presents today with penile discharge and dysuria.  Patient was treated here 11 days ago for gonorrhea and did not refrain from sexual activity.  Reports that he is still having symptoms.  Symptoms have been constant.  Denies any associated back pain, fevers, testicle pain, testicle swelling or rashes.  ROS per HPI      History reviewed. No pertinent past medical history.  Patient Active Problem List   Diagnosis Date Noted  . CONSTIPATION 06/30/2007    History reviewed. No pertinent surgical history.     Home Medications    Prior to Admission medications   Medication Sig Start Date End Date Taking? Authorizing Provider  naproxen (NAPROSYN) 500 MG tablet Take 1 tablet (500 mg total) by mouth 2 (two) times daily. 08/10/18   Jaynee Eagles, PA-C  fluticasone (FLONASE) 50 MCG/ACT nasal spray Place 1 spray into both nostrils daily. Patient not taking: Reported on 08/10/2018 04/12/17 08/10/18  Zigmund Gottron, NP    Family History History reviewed. No pertinent family history.  Social History Social History   Tobacco Use  . Smoking status: Current Every Day Smoker    Types: Cigarettes, Cigars  . Smokeless tobacco: Never Used  . Tobacco comment: black and mild 2x/week  Substance Use Topics  . Alcohol use: Yes    Comment: occ  . Drug use: No     Allergies   Patient has no known allergies.   Review of Systems Review of Systems   Physical Exam Triage Vital Signs ED Triage Vitals  Enc Vitals Group     BP 08/22/18 1523 121/74     Pulse Rate 08/22/18 1523 61     Resp 08/22/18 1523 12     Temp 08/22/18 1523 98.2 F (36.8 C)     Temp Source 08/22/18 1523 Oral     SpO2 08/22/18 1523 97 %     Weight --     Height --      Head Circumference --      Peak Flow --      Pain Score 08/22/18 1522 0     Pain Loc --      Pain Edu? --      Excl. in [place]? --    No data found.  Updated Vital Signs BP 121/74 (BP Location: Left Arm)   Pulse 61   Temp 98.2 F (36.8 C) (Oral)   Resp 12   SpO2 97%   Visual Acuity Right Eye Distance:   Left Eye Distance:   Bilateral Distance:    Right Eye Near:   Left Eye Near:    Bilateral Near:     Physical Exam Vitals signs and nursing note reviewed.  Constitutional:      General: He is not in acute distress.    Appearance: Normal appearance. He is not ill-appearing, toxic-appearing or diaphoretic.  HENT:     Head: Normocephalic and atraumatic.     Nose: Nose normal.  Eyes:     Conjunctiva/sclera: Conjunctivae normal.  Neck:     Musculoskeletal: Normal range of motion.  Pulmonary:     Effort: Pulmonary effort is normal.  Abdominal:     Palpations: Abdomen  is soft.     Tenderness: There is no abdominal tenderness.  Musculoskeletal: Normal range of motion.  Skin:    General: Skin is warm and dry.  Neurological:     Mental Status: He is alert.  Psychiatric:        Mood and Affect: Mood normal.      UC Treatments / Results  Labs (all labs ordered are listed, but only abnormal results are displayed) Labs Reviewed - No data to display  EKG   Radiology No results found.  Procedures Procedures (including critical care time)  Medications Ordered in UC Medications  cefTRIAXone (ROCEPHIN) injection 250 mg (250 mg Intramuscular Given 08/22/18 1539)  azithromycin (ZITHROMAX) tablet 1,000 mg (1,000 mg Oral Given 08/22/18 1539)  azithromycin (ZITHROMAX) 250 MG tablet (has no administration in time range)  sterile water (preservative free) injection (has no administration in time range)  cefTRIAXone (ROCEPHIN) 250 MG injection (has no administration in time range)    Initial Impression / Assessment and Plan / UC Course  I have reviewed the  triage vital signs and the nursing notes.  Pertinent labs & imaging results that were available during my care of the patient were reviewed by me and considered in my medical decision making (see chart for details).     Retreating today for gonorrhea and chlamydia Recommended refraining from sexual activity due to re-exposure Urine cytology pending Final Clinical Impressions(s) / UC Diagnoses   Final diagnoses:  Dysuria     Discharge Instructions     We are rechecking you for STDs Treating you again based on symptoms and possible reexposure.  Please refrain from sexual activity for 7 or more days to ensure the infection is gone.     ED Prescriptions    None     Controlled Substance Prescriptions [place] Controlled Substance Registry consulted? Not Applicable   Janace ArisBast, Kalijah Westfall A, NP 08/22/18 1545

## 2018-08-23 LAB — URINE CYTOLOGY ANCILLARY ONLY
Chlamydia: NEGATIVE
Neisseria Gonorrhea: NEGATIVE
Trichomonas: NEGATIVE

## 2018-08-25 LAB — URINE CYTOLOGY ANCILLARY ONLY: Candida vaginitis: NEGATIVE

## 2018-10-17 ENCOUNTER — Other Ambulatory Visit: Payer: Self-pay

## 2018-10-17 ENCOUNTER — Emergency Department (HOSPITAL_COMMUNITY)
Admission: EM | Admit: 2018-10-17 | Discharge: 2018-10-17 | Disposition: A | Discharge status: Home / Self Care | Payer: BC Managed Care – PPO | Attending: Emergency Medicine | Admitting: Emergency Medicine

## 2018-10-17 DIAGNOSIS — Z202 Contact with and (suspected) exposure to infections with a predominantly sexual mode of transmission: Secondary | ICD-10-CM | POA: Insufficient documentation

## 2018-10-17 DIAGNOSIS — F17210 Nicotine dependence, cigarettes, uncomplicated: Secondary | ICD-10-CM | POA: Insufficient documentation

## 2018-10-17 DIAGNOSIS — R300 Dysuria: Secondary | ICD-10-CM | POA: Diagnosis not present

## 2018-10-17 DIAGNOSIS — R369 Urethral discharge, unspecified: Secondary | ICD-10-CM | POA: Diagnosis not present

## 2018-10-17 DIAGNOSIS — Z79899 Other long term (current) drug therapy: Secondary | ICD-10-CM | POA: Diagnosis not present

## 2018-10-17 MED ORDER — LIDOCAINE HCL (PF) 1 % IJ SOLN
INTRAMUSCULAR | Status: AC
  Filled 2018-10-17: qty 5

## 2018-10-17 MED ORDER — LIDOCAINE HCL (PF) 1 % IJ SOLN
0.9000 mL | Freq: Once | INTRAMUSCULAR | Status: AC
  Administered 2018-10-17: 18:00:00 0.9 mL

## 2018-10-17 MED ORDER — CEFTRIAXONE SODIUM 250 MG IJ SOLR
250.0000 mg | Freq: Once | INTRAMUSCULAR | Status: AC
  Administered 2018-10-17: 18:00:00 250 mg via INTRAMUSCULAR
  Filled 2018-10-17: qty 250

## 2018-10-17 MED ORDER — AZITHROMYCIN 250 MG PO TABS
1000.0000 mg | ORAL_TABLET | Freq: Once | ORAL | Status: AC
  Administered 2018-10-17: 18:00:00 1000 mg via ORAL
  Filled 2018-10-17: qty 4

## 2018-10-17 NOTE — ED Provider Notes (Signed)
[facility] Provider Note   CSN: [id number] Arrival date & time: 10/17/18  1444     History   Chief Complaint Chief Complaint  Patient presents with  . SEXUALLY TRANSMITTED DISEASE    HPI Danny Mccoy is a 33 y.o. male.     HPI   33 year old male presents today with 2-day history of penile discharge.  Patient notes purulent discharge from his penis pain with urination.  He denies any abdominal pain nausea vomiting or fever.  He notes this feels similar to previous STD.  He notes he had sex with a male partner.  No past medical history on file.  Patient Active Problem List   Diagnosis Date Noted  . CONSTIPATION 06/30/2007    No past surgical history on file.      Home Medications    Prior to Admission medications   Medication Sig Start Date End Date Taking? Authorizing Provider  naproxen (NAPROSYN) 500 MG tablet Take 1 tablet (500 mg total) by mouth 2 (two) times daily. 08/10/18   Wallis BambergMani, Mario, PA-C  fluticasone (FLONASE) 50 MCG/ACT nasal spray Place 1 spray into both nostrils daily. Patient not taking: Reported on 08/10/2018 04/12/17 08/10/18  Georgetta HaberBurky, Natalie B, NP    Family History No family history on file.  Social History Social History   Tobacco Use  . Smoking status: Current Every Day Smoker    Types: Cigarettes, Cigars  . Smokeless tobacco: Never Used  . Tobacco comment: black and mild 2x/week  Substance Use Topics  . Alcohol use: Yes    Comment: occ  . Drug use: No     Allergies   Patient has no known allergies.   Review of Systems Review of Systems  All other systems reviewed and are negative.    Physical Exam Updated Vital Signs BP 124/62 (BP Location: Right Arm)   Pulse 78   Temp 98.3 F (36.8 C) (Oral)   Resp 16   SpO2 100%   Physical Exam Vitals signs and nursing note reviewed.  Constitutional:      Appearance: He is well-developed.  HENT:     Head: Normocephalic and atraumatic.  Eyes:      General: No scleral icterus.       Right eye: No discharge.        Left eye: No discharge.     Conjunctiva/sclera: Conjunctivae normal.     Pupils: Pupils are equal, round, and reactive to light.  Neck:     Musculoskeletal: Normal range of motion.     Vascular: No JVD.     Trachea: No tracheal deviation.  Pulmonary:     Effort: Pulmonary effort is normal.     Breath sounds: No stridor.  Neurological:     Mental Status: He is alert and oriented to person, place, and time.     Coordination: Coordination normal.  Psychiatric:        Behavior: Behavior normal.        Thought Content: Thought content normal.        Judgment: Judgment normal.      ED Treatments / Results  Labs (all labs ordered are listed, but only abnormal results are displayed) Labs Reviewed  GC/CHLAMYDIA PROBE AMP ([place]) NOT AT [facility]    EKG None  Radiology No results found.  Procedures Procedures (including critical care time)  Medications Ordered in ED Medications  cefTRIAXone (ROCEPHIN) injection 250 mg (has no administration in time range)  azithromycin (ZITHROMAX) tablet  1,000 mg (has no administration in time range)     Initial Impression / Assessment and Plan / ED Course  I have reviewed the triage vital signs and the nursing notes.  Pertinent labs & imaging results that were available during my care of the patient were reviewed by me and considered in my medical decision making (see chart for details).        33 year old male presents today with likely STD.  He be treated prophylactically.  Discharged with symptomatic care and return precautions.  Final Clinical Impressions(s) / ED Diagnoses   Final diagnoses:  Penile discharge    ED Discharge Orders    None       Francee Gentile 10/17/18 1640    Veryl Speak, MD 10/17/18 1825

## 2018-10-17 NOTE — ED Triage Notes (Signed)
Pt here for evaluation of penile discharge x 2 days. Denies new partners or unprotected sex. Denies pain.

## 2018-10-17 NOTE — Discharge Instructions (Signed)
Please read attached information. If you experience any new or worsening signs or symptoms please return to the emergency room for evaluation.  °

## 2018-10-19 LAB — GC/CHLAMYDIA PROBE AMP (~~LOC~~) NOT AT ARMC
Chlamydia: NEGATIVE
Neisseria Gonorrhea: NEGATIVE

## 2018-10-29 ENCOUNTER — Encounter (HOSPITAL_COMMUNITY): Payer: Self-pay | Admitting: Student

## 2018-10-29 ENCOUNTER — Emergency Department (HOSPITAL_COMMUNITY)
Admission: EM | Admit: 2018-10-29 | Discharge: 2018-10-29 | Disposition: A | Discharge status: Home / Self Care | Payer: BC Managed Care – PPO | Attending: Emergency Medicine | Admitting: Emergency Medicine

## 2018-10-29 ENCOUNTER — Other Ambulatory Visit: Payer: Self-pay

## 2018-10-29 DIAGNOSIS — F17290 Nicotine dependence, other tobacco product, uncomplicated: Secondary | ICD-10-CM | POA: Insufficient documentation

## 2018-10-29 DIAGNOSIS — F17210 Nicotine dependence, cigarettes, uncomplicated: Secondary | ICD-10-CM | POA: Diagnosis not present

## 2018-10-29 DIAGNOSIS — R369 Urethral discharge, unspecified: Secondary | ICD-10-CM | POA: Insufficient documentation

## 2018-10-29 LAB — URINALYSIS, ROUTINE W REFLEX MICROSCOPIC
Bacteria, UA: NONE SEEN
Bilirubin Urine: NEGATIVE
Glucose, UA: NEGATIVE mg/dL
Hgb urine dipstick: NEGATIVE
Ketones, ur: NEGATIVE mg/dL
Nitrite: NEGATIVE
Protein, ur: NEGATIVE mg/dL
Specific Gravity, Urine: 1.031 — ABNORMAL HIGH (ref 1.005–1.030)
pH: 5 (ref 5.0–8.0)

## 2018-10-29 MED ORDER — STERILE WATER FOR INJECTION IJ SOLN
0.9000 mL | Freq: Once | INTRAMUSCULAR | Status: AC
  Administered 2018-10-29: 11:00:00 0.9 mL via INTRAMUSCULAR

## 2018-10-29 MED ORDER — CEFTRIAXONE SODIUM 250 MG IJ SOLR
250.0000 mg | Freq: Once | INTRAMUSCULAR | Status: AC
  Administered 2018-10-29: 11:00:00 250 mg via INTRAMUSCULAR
  Filled 2018-10-29: qty 250

## 2018-10-29 MED ORDER — METRONIDAZOLE 500 MG PO TABS
2000.0000 mg | ORAL_TABLET | Freq: Once | ORAL | Status: AC
  Administered 2018-10-29: 11:00:00 2000 mg via ORAL
  Filled 2018-10-29: qty 4

## 2018-10-29 MED ORDER — ONDANSETRON 4 MG PO TBDP
8.0000 mg | ORAL_TABLET | Freq: Once | ORAL | Status: AC
  Administered 2018-10-29: 11:00:00 8 mg via ORAL
  Filled 2018-10-29: qty 2

## 2018-10-29 MED ORDER — STERILE WATER FOR INJECTION IJ SOLN
INTRAMUSCULAR | Status: AC
  Administered 2018-10-29: 11:00:00 0.9 mL via INTRAMUSCULAR
  Filled 2018-10-29: qty 10

## 2018-10-29 MED ORDER — AZITHROMYCIN 250 MG PO TABS
1000.0000 mg | ORAL_TABLET | Freq: Once | ORAL | Status: AC
  Administered 2018-10-29: 1000 mg via ORAL
  Filled 2018-10-29: qty 4

## 2018-10-29 NOTE — Discharge Instructions (Addendum)
You were seen in the emergency department today for penile discharge.  You were treated for possible gonorrhea, chlamydia, and trichomonas.  Do not drink alcohol for the next 24 hours after having taken 1 of the medicines in the ER.  Should any of your results return positive you will need to inform all sexual partners.  Do not participate in intercourse of any kind for a minimum of 1 week.  When participating in intercourse be sure to use condoms for protection against STDs.  Please follow-up with the health department within 1 week, please go to the health department for further STD needs.  Return to the ER for new or worsening symptoms including but not limited to fever, pain/swelling to the testicles, pain with a bowel movement, abdominal pain, or any other concerns.

## 2018-10-29 NOTE — ED Provider Notes (Signed)
[place] EMERGENCY DEPARTMENT Provider Note   CSN: [id number] Arrival date & time: 10/29/18  0756     History   Chief Complaint Chief Complaint  Patient presents with  . Penile Discharge    HPI Danny Mccoy is a 33 y.o. male with a hx of tobacco abuse who presents to the ED with complaints of penile discharge for past coupe of days. Discharge is clear & thin, non pruritic, no alleviating/aggravating factors, no intervention PTA. Mild dysuria as well. Sexually active w/ 2 females partners within the past 6 months, not using protection. Denies fever, chills, abdominal pain, testicular pain/swelling, or pain w/ BM.      HPI  No past medical history on file.  Patient Active Problem List   Diagnosis Date Noted  . CONSTIPATION 06/30/2007    No past surgical history on file.      Home Medications    Prior to Admission medications   Medication Sig Start Date End Date Taking? Authorizing Provider  naproxen (NAPROSYN) 500 MG tablet Take 1 tablet (500 mg total) by mouth 2 (two) times daily. 08/10/18   Jaynee Eagles, PA-C  fluticasone (FLONASE) 50 MCG/ACT nasal spray Place 1 spray into both nostrils daily. Patient not taking: Reported on 08/10/2018 04/12/17 08/10/18  Zigmund Gottron, NP    Family History No family history on file.  Social History Social History   Tobacco Use  . Smoking status: Current Every Day Smoker    Types: Cigarettes, Cigars  . Smokeless tobacco: Never Used  . Tobacco comment: black and mild 2x/week  Substance Use Topics  . Alcohol use: Yes    Comment: occ  . Drug use: No     Allergies   Patient has no known allergies.   Review of Systems Review of Systems  Constitutional: Negative for chills and fever.  Gastrointestinal: Negative for abdominal pain, anal bleeding, blood in stool, constipation, diarrhea, nausea and vomiting.  Genitourinary: Positive for discharge and dysuria. Negative for genital sores, hematuria, scrotal  swelling and testicular pain.    Physical Exam Updated Vital Signs BP 121/60 (BP Location: Right Arm)   Pulse 63   Temp 97.8 F (36.6 C) (Oral)   Resp 16   SpO2 97%   Physical Exam Vitals signs and nursing note reviewed.  Constitutional:      General: He is not in acute distress.    Appearance: He is well-developed.  HENT:     Head: Normocephalic and atraumatic.  Eyes:     General:        Right eye: No discharge.        Left eye: No discharge.     Conjunctiva/sclera: Conjunctivae normal.  Abdominal:     General: There is no distension.     Palpations: Abdomen is soft.     Tenderness: There is no abdominal tenderness. There is no right CVA tenderness, left CVA tenderness, guarding or rebound.  Genitourinary:    Penis: Circumcised. No erythema, discharge or lesions.      Scrotum/Testes: Normal. Cremasteric reflex is present.        Right: Mass or tenderness not present.        Left: Mass or tenderness not present.     Epididymis:     Right: No tenderness.     Left: No tenderness.     Comments: EDT present as chaperone.  Lymphadenopathy:     Lower Body: No right inguinal adenopathy. No left inguinal adenopathy.  Neurological:  Mental Status: He is alert.     Comments: Clear speech.   Psychiatric:        Behavior: Behavior normal.        Thought Content: Thought content normal.     ED Treatments / Results  Labs (all labs ordered are listed, but only abnormal results are displayed) Labs Reviewed - No data to display  EKG None  Radiology No results found.  Procedures Procedures (including critical care time)  Medications Ordered in ED Medications  cefTRIAXone (ROCEPHIN) injection 250 mg (has no administration in time range)  azithromycin (ZITHROMAX) tablet 1,000 mg (has no administration in time range)  metroNIDAZOLE (FLAGYL) tablet 2,000 mg (has no administration in time range)  ondansetron (ZOFRAN-ODT) disintegrating tablet 8 mg (has no administration  in time range)     Initial Impression / Assessment and Plan / ED Course  I have reviewed the triage vital signs and the nursing notes.  Pertinent labs & imaging results that were available during my care of the patient were reviewed by me and considered in my medical decision making (see chart for details).   Patient presents to the ED with penile discharge.  Chart review: 7th visit this year for similar, seen most recently 10/17/18. Has been positive for gonorrhea 08/2018, tests since then negative.   No H&P components to indicate orchitis, epididymitis, or prostatitis.  UA w/ elevated specific gravity, trace leuks- not overly consistent w/ UTI Tested for GC/chlamydia. Refused HIV/RPR testing.  Education performed regarding protection when sexually active as well as utilization of the health department for STD check needs.  Tx w/ azithromycin/rocehpin/flagyl for possible GC/chlamydia/trich.   I discussed results, treatment plan, need for follow-up, and return precautions with the patient. Provided opportunity for questions, patient confirmed understanding and is in agreement with plan.   Final Clinical Impressions(s) / ED Diagnoses   Final diagnoses:  Penile discharge    ED Discharge Orders    None       Cherly Andersonetrucelli, Samantha R, PA-C 10/29/18 1041    Alvira MondaySchlossman, Erin, MD 10/30/18 [id number]

## 2018-10-29 NOTE — ED Triage Notes (Signed)
Patient complains of penile discharge x 2 days, reports unprotected sex, NAD

## 2018-10-30 LAB — URINE CULTURE: Culture: 20000 — AB

## 2018-11-08 ENCOUNTER — Encounter (HOSPITAL_COMMUNITY): Payer: Self-pay

## 2018-11-08 ENCOUNTER — Ambulatory Visit (HOSPITAL_COMMUNITY)
Admission: EM | Admit: 2018-11-08 | Discharge: 2018-11-08 | Disposition: A | Discharge status: Home / Self Care | Payer: BC Managed Care – PPO | Attending: Family Medicine | Admitting: Family Medicine

## 2018-11-08 DIAGNOSIS — N3001 Acute cystitis with hematuria: Secondary | ICD-10-CM | POA: Diagnosis present

## 2018-11-08 DIAGNOSIS — R369 Urethral discharge, unspecified: Secondary | ICD-10-CM | POA: Insufficient documentation

## 2018-11-08 DIAGNOSIS — Z113 Encounter for screening for infections with a predominantly sexual mode of transmission: Secondary | ICD-10-CM

## 2018-11-08 DIAGNOSIS — Z202 Contact with and (suspected) exposure to infections with a predominantly sexual mode of transmission: Secondary | ICD-10-CM

## 2018-11-08 LAB — POCT URINALYSIS DIP (DEVICE)
Bilirubin Urine: NEGATIVE
Glucose, UA: NEGATIVE mg/dL
Ketones, ur: NEGATIVE mg/dL
Nitrite: POSITIVE — AB
Protein, ur: NEGATIVE mg/dL
Specific Gravity, Urine: >=1.03 (ref 1.005–1.030)
Urobilinogen, UA: 1 mg/dL (ref 0.0–1.0)
pH: 7 (ref 5.0–8.0)

## 2018-11-08 MED ORDER — AZITHROMYCIN 250 MG PO TABS
ORAL_TABLET | ORAL | Status: AC
  Filled 2018-11-08: qty 4

## 2018-11-08 MED ORDER — CEFTRIAXONE SODIUM 250 MG IJ SOLR
INTRAMUSCULAR | Status: AC
  Filled 2018-11-08: qty 250

## 2018-11-08 MED ORDER — AZITHROMYCIN 250 MG PO TABS
1000.0000 mg | ORAL_TABLET | Freq: Once | ORAL | Status: AC
  Administered 2018-11-08: 1000 mg via ORAL

## 2018-11-08 MED ORDER — CEFTRIAXONE SODIUM 250 MG IJ SOLR
250.0000 mg | Freq: Once | INTRAMUSCULAR | Status: AC
  Administered 2018-11-08: 250 mg via INTRAMUSCULAR

## 2018-11-08 MED ORDER — SULFAMETHOXAZOLE-TRIMETHOPRIM 800-160 MG PO TABS: 1.0000 | ORAL_TABLET | Freq: Two times a day (BID) | ORAL | 0 refills | Status: DC

## 2018-11-08 NOTE — ED Triage Notes (Signed)
Patient report he is having dysuria and clearpenile discharge x 4 days.

## 2018-11-08 NOTE — ED Provider Notes (Signed)
Danny Mccoy    CSN: [id number] Arrival date & time: 11/08/18  0803      History   Chief Complaint Chief Complaint  Patient presents with  . Dysuria  . Exposure to STD    HPI Vollie Brunty is a 33 y.o. male.   Pt is a 33 year old male that presents today with penile discharge, dysuria.  This has been present and worsening over the last couple days.  Reporting a mild sore throat and overall just not feeling well.  Denies any associated fevers, chills, abdominal pain, nausea or vomiting.  No testicle pain or swelling.  No rashes.  Currently sexually active with multiple partners unprotected.  Reporting oral sex. He has had multiple visits for the same over the past 6 months.   ROS per HPI      History reviewed. No pertinent past medical history.  Patient Active Problem List   Diagnosis Date Noted  . CONSTIPATION 06/30/2007    History reviewed. No pertinent surgical history.     Home Medications    Prior to Admission medications   Medication Sig Start Date End Date Taking? Authorizing Provider  naproxen (NAPROSYN) 500 MG tablet Take 1 tablet (500 mg total) by mouth 2 (two) times daily. 08/10/18   Jaynee Eagles, PA-C  sulfamethoxazole-trimethoprim (BACTRIM DS) 800-160 MG tablet Take 1 tablet by mouth 2 (two) times daily for 7 days. 11/08/18 11/15/18  Loura Halt A, NP  fluticasone (FLONASE) 50 MCG/ACT nasal spray Place 1 spray into both nostrils daily. Patient not taking: Reported on 08/10/2018 04/12/17 08/10/18  Zigmund Gottron, NP    Family History History reviewed. No pertinent family history.  Social History Social History   Tobacco Use  . Smoking status: Current Every Day Smoker    Types: Cigarettes, Cigars  . Smokeless tobacco: Never Used  . Tobacco comment: black and mild 2x/week  Substance Use Topics  . Alcohol use: Yes    Comment: occ  . Drug use: No     Allergies   Patient has no known allergies.   Review of Systems Review of Systems   Physical Exam Triage Vital Signs ED Triage Vitals  Enc Vitals Group     BP 11/08/18 0824 124/71     Pulse Rate 11/08/18 0824 63     Resp 11/08/18 0824 16     Temp 11/08/18 0824 98.4 F (36.9 C)     Temp Source 11/08/18 0824 Oral     SpO2 11/08/18 0824 98 %     Weight --      Height --      Head Circumference --      Peak Flow --      Pain Score 11/08/18 0822 0     Pain Loc --      Pain Edu? --      Excl. in [place]? --    No data found.  Updated Vital Signs BP 124/71 (BP Location: Left Arm)   Pulse 63   Temp 98.4 F (36.9 C) (Oral)   Resp 16   SpO2 98%   Visual Acuity Right Eye Distance:   Left Eye Distance:   Bilateral Distance:    Right Eye Near:   Left Eye Near:    Bilateral Near:     Physical Exam Vitals signs and nursing note reviewed.  Constitutional:      General: He is not in acute distress.    Appearance: Normal appearance. He is not ill-appearing, toxic-appearing or  diaphoretic.  HENT:     Head: Normocephalic and atraumatic.     Nose: Nose normal.  Eyes:     Conjunctiva/sclera: Conjunctivae normal.  Neck:     Musculoskeletal: Normal range of motion.  Pulmonary:     Effort: Pulmonary effort is normal.  Abdominal:     Palpations: Abdomen is soft.     Tenderness: There is no abdominal tenderness.  Musculoskeletal: Normal range of motion.  Skin:    General: Skin is warm and dry.  Neurological:     Mental Status: He is alert.  Psychiatric:        Mood and Affect: Mood normal.      UC Treatments / Results  Labs (all labs ordered are listed, but only abnormal results are displayed) Labs Reviewed  POCT URINALYSIS DIP (DEVICE) - Abnormal; Notable for the following components:      Result Value   Hgb urine dipstick TRACE (*)    Nitrite POSITIVE (*)    Leukocytes,Ua SMALL (*)    All other components within normal limits  URINE CULTURE  CYTOLOGY, (ORAL, ANAL, URETHRAL) ANCILLARY ONLY    EKG   Radiology No results found.  Procedures  Procedures (including critical care time)  Medications Ordered in UC Medications  cefTRIAXone (ROCEPHIN) injection 250 mg (250 mg Intramuscular Given 11/08/18 0912)  azithromycin (ZITHROMAX) tablet 1,000 mg (1,000 mg Oral Given 11/08/18 0912)  azithromycin (ZITHROMAX) 250 MG tablet (has no administration in time range)  cefTRIAXone (ROCEPHIN) 250 MG injection (has no administration in time range)    Initial Impression / Assessment and Plan / UC Course  I have reviewed the triage vital signs and the nursing notes.  Pertinent labs & imaging results that were available during my care of the patient were reviewed by me and considered in my medical decision making (see chart for details).     Acute cystitis and penile discharge- treating for gonorrhea and chlamydia in clinic.  Sending bactrim for abx coverage for the UTI.  Recommended safe sex practices.  Follow up as needed for continued or worsening symptoms  Final Clinical Impressions(s) / UC Diagnoses   Final diagnoses:  Acute cystitis with hematuria  Penile discharge     Discharge Instructions     Treating you for STDs today Sending antibiotics to the pharmacy to treat a urinary tract infection.  Drink plenty of water.  Follow up as needed for continued or worsening symptoms     ED Prescriptions    Medication Sig Dispense Auth. Provider   sulfamethoxazole-trimethoprim (BACTRIM DS) 800-160 MG tablet Take 1 tablet by mouth 2 (two) times daily for 7 days. 14 tablet Olie Dibert A, NP     PDMP not reviewed this encounter.   Dahlia Byes A, NP 11/08/18 [phone]

## 2018-11-08 NOTE — Discharge Instructions (Signed)
Treating you for STDs today Sending antibiotics to the pharmacy to treat a urinary tract infection.  Drink plenty of water.  Follow up as needed for continued or worsening symptoms

## 2018-11-09 ENCOUNTER — Telehealth (HOSPITAL_COMMUNITY): Payer: Self-pay

## 2018-11-09 LAB — CYTOLOGY, (ORAL, ANAL, URETHRAL) ANCILLARY ONLY
Chlamydia: NEGATIVE
Molecular Disclaimer: NEGATIVE
Molecular Disclaimer: NEGATIVE
Molecular Disclaimer: NORMAL
Neisseria Gonorrhea: NEGATIVE
Trichomonas: NEGATIVE

## 2018-11-09 MED ORDER — SULFAMETHOXAZOLE-TRIMETHOPRIM 800-160 MG PO TABS: 1.0000 | ORAL_TABLET | Freq: Two times a day (BID) | ORAL | 0 refills | Status: AC

## 2018-11-10 ENCOUNTER — Telehealth (HOSPITAL_COMMUNITY): Payer: Self-pay | Admitting: Emergency Medicine

## 2018-11-10 LAB — URINE CULTURE: Culture: >=100000 — AB

## 2018-11-10 MED ORDER — CEPHALEXIN 500 MG PO CAPS: 500.0000 mg | ORAL_CAPSULE | Freq: Two times a day (BID) | ORAL | 0 refills | Status: AC

## 2018-11-10 NOTE — Telephone Encounter (Signed)
Urine culture was positive for KLEBSIELLA PNEUMONIAE resistant to bactrim given at urgent care visit. Prescription for keflex BID 500mg  x7 days per Traci sent to pharmacy of choice. Attempted to reach patient. No answer at this time. Voicemail left.

## 2018-11-11 ENCOUNTER — Telehealth (HOSPITAL_COMMUNITY): Payer: Self-pay | Admitting: Emergency Medicine

## 2018-11-11 NOTE — Telephone Encounter (Signed)
Attempted to reach patient x2. No answer at this time. Voicemail left.    

## 2018-11-14 ENCOUNTER — Telehealth (HOSPITAL_COMMUNITY): Payer: Self-pay | Admitting: Emergency Medicine

## 2018-11-14 NOTE — Telephone Encounter (Signed)
Attempted to reach patient x3. No answer at this time. Voicemail left. Letter sent    

## 2018-11-18 ENCOUNTER — Telehealth (HOSPITAL_COMMUNITY): Payer: Self-pay | Admitting: Emergency Medicine

## 2018-11-18 NOTE — Telephone Encounter (Signed)
Pt called back, informed him of his positive culture, pt c/o ongoing symptoms due to untreated UTI. Pt informed he has keflex at the pharmacy waiting for him. All questions answered.

## 2018-11-30 ENCOUNTER — Ambulatory Visit (HOSPITAL_COMMUNITY)
Admission: EM | Admit: 2018-11-30 | Discharge: 2018-11-30 | Disposition: A | Discharge status: Home / Self Care | Payer: BC Managed Care – PPO | Attending: Urgent Care | Admitting: Urgent Care

## 2018-11-30 ENCOUNTER — Other Ambulatory Visit: Payer: Self-pay

## 2018-11-30 ENCOUNTER — Encounter (HOSPITAL_COMMUNITY): Payer: Self-pay | Admitting: Urgent Care

## 2018-11-30 DIAGNOSIS — R300 Dysuria: Secondary | ICD-10-CM | POA: Insufficient documentation

## 2018-11-30 DIAGNOSIS — R369 Urethral discharge, unspecified: Secondary | ICD-10-CM | POA: Diagnosis present

## 2018-11-30 DIAGNOSIS — Z7251 High risk heterosexual behavior: Secondary | ICD-10-CM | POA: Diagnosis present

## 2018-11-30 MED ORDER — CEFTRIAXONE SODIUM 1 G IJ SOLR
1.0000 g | Freq: Once | INTRAMUSCULAR | Status: AC
  Administered 2018-11-30: 09:00:00 1 g via INTRAMUSCULAR

## 2018-11-30 MED ORDER — CIPROFLOXACIN HCL 500 MG PO TABS: 500.0000 mg | ORAL_TABLET | Freq: Two times a day (BID) | ORAL | 0 refills | Status: DC

## 2018-11-30 MED ORDER — CEFTRIAXONE SODIUM 1 G IJ SOLR
INTRAMUSCULAR | Status: AC
  Filled 2018-11-30: qty 10

## 2018-11-30 NOTE — ED Triage Notes (Signed)
Pt presents to UC stating he was treated for UTI and STD few weeks ago. Pt states discharge and odor have returned since 1 week.

## 2018-11-30 NOTE — ED Provider Notes (Signed)
  MRN: [id number] DOB: Jul 12, 1985  Subjective:   Danny Mccoy is a 33 y.o. male presenting for 1 week history of recurrent penile discharge, dysuria.  At the end of September, patient was treated for UTI and STI.  While he was in the clinic he received IM ceftriaxone and p.o. azithromycin to cover empirically for STI.  Cytology was negative for gonorrhea, chlamydia, trichomonas.  Urine culture did show Klebsiella resistant to multiple antibiotics, patient completed course of Keflex and had resolution of symptoms until the past week.  He states that he did have sex again with same male from before.  She did not get any kind of testing for STI.  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  He had to undergo an extended course of ciprofloxacin but achieved resolution of his symptoms then.  Denies taking any chronic medications.    No Known Allergies  History reviewed. No pertinent past medical history.   History reviewed. No pertinent surgical history.  ROS  Objective:   Vitals: BP 113/71 (BP Location: Right Arm)   Pulse (!) 58   Temp 98.1 F (36.7 C) (Temporal)   Resp 16   SpO2 98%   Physical Exam Constitutional:      Appearance: Normal appearance. He is well-developed and normal weight.  HENT:     Head: Normocephalic and atraumatic.     Right Ear: External ear normal.     Left Ear: External ear normal.     Nose: Nose normal.     Mouth/Throat:     Pharynx: Oropharynx is clear.  Eyes:     General: No scleral icterus.    Extraocular Movements: Extraocular movements intact.     Pupils: Pupils are equal, round, and reactive to light.  Cardiovascular:     Rate and Rhythm: Normal rate and regular rhythm.     Heart sounds: No murmur. No friction rub. No gallop.   Pulmonary:     Effort: Pulmonary effort is normal. No respiratory distress.     Breath sounds: No wheezing or rales.  Abdominal:     General: Bowel sounds  are normal. There is no distension.     Palpations: Abdomen is soft. There is no mass.     Tenderness: There is no abdominal tenderness. There is no guarding or rebound.  Genitourinary:    Penis: No phimosis, paraphimosis, hypospadias, erythema, tenderness, discharge, swelling or lesions.   Skin:    General: Skin is warm and dry.  Neurological:     Mental Status: He is alert and oriented to person, place, and time.  Psychiatric:        Mood and Affect: Mood normal.        Behavior: Behavior normal.      Assessment and Plan :   1. Dysuria   2. Penile discharge   3. Unprotected sex     Urine culture and STI testing pending.  We will give patient 1 g ceftriaxone and start patient on ciprofloxacin to address recurrent infection, antibiotic choice based off of previous urine culture demonstrating Klebsiella infection.  Emphasized need to follow-up with [facility] as patient may need further testing and a more extended course of ciprofloxacin.  He is agreeable to setting up a follow-up with [facility]. Counseled patient on potential for adverse effects with medications prescribed/recommended today, ER and return-to-clinic precautions discussed, patient verbalized understanding.    Jaynee Eagles, [place] 11/30/18 [phone]

## 2018-11-30 NOTE — Discharge Instructions (Signed)
Please make sure that you set up a follow-up appointment with [facility] as I am afraid you may need another extended several week course of ciprofloxacin for your infection.  You may also need further work-up such as an ultrasound or cystoscopy which they can perform.

## 2018-12-01 LAB — CYTOLOGY, (ORAL, ANAL, URETHRAL) ANCILLARY ONLY
Chlamydia: NEGATIVE
Neisseria Gonorrhea: NEGATIVE
Trichomonas: NEGATIVE

## 2018-12-02 ENCOUNTER — Telehealth (HOSPITAL_COMMUNITY): Payer: Self-pay | Admitting: Emergency Medicine

## 2018-12-02 LAB — URINE CULTURE: Culture: >=100000 — AB

## 2018-12-02 NOTE — Telephone Encounter (Signed)
Urine culture was positive for KLEBSIELLA PNEUMONIAE and was given cipro  at urgent care visit. Attempted to reach patient. No answer at this time. Voicemail left. If patient returns call, please inform him he must follow up again with urology for repeated UTIs.

## 2019-01-16 ENCOUNTER — Other Ambulatory Visit: Payer: Self-pay

## 2019-01-16 ENCOUNTER — Encounter (HOSPITAL_COMMUNITY): Payer: Self-pay

## 2019-01-16 ENCOUNTER — Ambulatory Visit (HOSPITAL_COMMUNITY)
Admission: EM | Admit: 2019-01-16 | Discharge: 2019-01-16 | Disposition: A | Discharge status: Home / Self Care | Payer: BC Managed Care – PPO | Attending: Emergency Medicine | Admitting: Emergency Medicine

## 2019-01-16 DIAGNOSIS — R369 Urethral discharge, unspecified: Secondary | ICD-10-CM | POA: Insufficient documentation

## 2019-01-16 DIAGNOSIS — R195 Other fecal abnormalities: Secondary | ICD-10-CM | POA: Diagnosis present

## 2019-01-16 LAB — POCT URINALYSIS DIP (DEVICE)
Bilirubin Urine: NEGATIVE
Glucose, UA: NEGATIVE mg/dL
Hgb urine dipstick: NEGATIVE
Ketones, ur: NEGATIVE mg/dL
Leukocytes,Ua: NEGATIVE
Nitrite: NEGATIVE
Protein, ur: NEGATIVE mg/dL
Specific Gravity, Urine: >=1.03 (ref 1.005–1.030)
Urobilinogen, UA: 1 mg/dL (ref 0.0–1.0)
pH: 5.5 (ref 5.0–8.0)

## 2019-01-16 NOTE — ED Triage Notes (Signed)
Patient presents to Urgent Care with complaints of penile discharge and diarrhea since about 5 days ago. Patient reports he has been having unprotected intercourse.

## 2019-01-16 NOTE — ED Provider Notes (Signed)
[facility]    CSN: [id number] Arrival date & time: 01/16/19  0815      History   Chief Complaint Chief Complaint  Patient presents with  . Penile Discharge  . Diarrhea    HPI Danny Mccoy is a 33 y.o. male.   Danny Mccoy presents with complaints of penile discharge for the past 5-6 days. No urinary symptoms. Also with upset stomach and increased stools. No diarrhea today. Had two stools yesterday. No blood or black. No vomiting. Decreased appetite. No fevers. No URI symptoms. No back pain. No known ill contacts. Declines covid-19 testing. No known questionable food intake, spoiled food etc. Loose stools started 3 days ago. No known exposure to stds but has had three partners in the past month and doesn't use condoms. Per chart review has had UTI's in the past, and has been in frequently for penile discharge. Has received frequent empiric dosing for std's, last 10/21 with  Negative sti panel. Was positive for gonorrhea back in July of 2020 with 4 negative tests since.    ROS per HPI, negative if not otherwise mentioned.      History reviewed. No pertinent past medical history.  Patient Active Problem List   Diagnosis Date Noted  . CONSTIPATION 06/30/2007    History reviewed. No pertinent surgical history.     Home Medications    Prior to Admission medications   Medication Sig Start Date End Date Taking? Authorizing Provider  ciprofloxacin (CIPRO) 500 MG tablet Take 1 tablet (500 mg total) by mouth 2 (two) times daily. 11/30/18   Wallis Bamberg, PA-C  naproxen (NAPROSYN) 500 MG tablet Take 1 tablet (500 mg total) by mouth 2 (two) times daily. 08/10/18   Wallis Bamberg, PA-C  fluticasone (FLONASE) 50 MCG/ACT nasal spray Place 1 spray into both nostrils daily. Patient not taking: Reported on 08/10/2018 04/12/17 08/10/18  Georgetta Haber, NP    Family History Family History  Problem Relation Age of Onset  . Healthy Mother   . Healthy Father     Social  History Social History   Tobacco Use  . Smoking status: Former Smoker    Types: Cigarettes, Cigars    Quit date: 01/10/2019    Years since quitting: 0.0  . Smokeless tobacco: Never Used  . Tobacco comment: black and mild 2x/week  Substance Use Topics  . Alcohol use: Yes    Comment: occ  . Drug use: No     Allergies   Patient has no known allergies.   Review of Systems Review of Systems   Physical Exam Triage Vital Signs ED Triage Vitals  Enc Vitals Group     BP 01/16/19 0840 (!) 134/57     Pulse Rate 01/16/19 0840 70     Resp 01/16/19 0840 16     Temp 01/16/19 0840 98.7 F (37.1 C)     Temp Source 01/16/19 0840 Oral     SpO2 01/16/19 0840 100 %     Weight --      Height --      Head Circumference --      Peak Flow --      Pain Score 01/16/19 0839 0     Pain Loc --      Pain Edu? --      Excl. in GC? --    No data found.  Updated Vital Signs BP (!) 134/57 (BP Location: Left Arm)   Pulse 70   Temp 98.7 F (37.1 C) (Oral)  Resp 16   SpO2 100%    Physical Exam Constitutional:      Appearance: He is well-developed.  Cardiovascular:     Rate and Rhythm: Normal rate.  Pulmonary:     Effort: Pulmonary effort is normal.  Abdominal:     Palpations: Abdomen is soft. Abdomen is not rigid.     Tenderness: There is no abdominal tenderness. There is no guarding or rebound. Negative signs include Murphy's sign and McBurney's sign.     Comments: Denies scrotal redness, swelling, pain; denies sores or lesions; gu exam deferred   Skin:    General: Skin is warm and dry.  Neurological:     Mental Status: He is alert and oriented to person, place, and time.      UC Treatments / Results  Labs (all labs ordered are listed, but only abnormal results are displayed) Labs Reviewed  URINE CULTURE  POCT URINALYSIS DIP (DEVICE)  CYTOLOGY, (ORAL, ANAL, URETHRAL) ANCILLARY ONLY    EKG   Radiology No results found.  Procedures Procedures (including critical  care time)  Medications Ordered in UC Medications - No data to display  Initial Impression / Assessment and Plan / UC Course  I have reviewed the triage vital signs and the nursing notes.  Pertinent labs & imaging results that were available during my care of the patient were reviewed by me and considered in my medical decision making (see chart for details).     Non toxic. Benign physical exam.  Increased loose stools, discussed supportive cares for this. Patient declines covid testing at this time. Patient has been receiving empiric sti treatment approximately every 6 weeks for the past year at least it appears in chart review. Will await final cytology results at this time and send appropriate treatment as indicated. Normal UA dip today with culture obtained due to previous history of positive cultures. Return precautions provided. Safe sex encouraged. Patient verbalized understanding and agreeable to plan.   Final Clinical Impressions(s) / UC Diagnoses   Final diagnoses:  Penile discharge  Loose stools     Discharge Instructions     Your urine looks well today, please increase the amount of fluid you are drinking. I do not see any indication of infection to your urine today, however.  Will notify you of any positive findings from your penile swab and if any changes to treatment are needed. You may monitor your results on your MyChart online as well.   Bland diet as tolerated.  Small frequent sips of fluids- Pedialyte, Gatorade, water, broth- to maintain hydration.   Your loose stools should continue to gradually improve over the next 5-7 days.  Please return for any worsening of abdominal pain, fevers, blood or black in your stool or no improvement in the next two weeks.  Please use condoms to prevent STDs     ED Prescriptions    None     PDMP not reviewed this encounter.   Zigmund Gottron, NP 01/16/19 [phone]

## 2019-01-16 NOTE — Discharge Instructions (Signed)
Your urine looks well today, please increase the amount of fluid you are drinking. I do not see any indication of infection to your urine today, however.  Will notify you of any positive findings from your penile swab and if any changes to treatment are needed. You may monitor your results on your MyChart online as well.   Bland diet as tolerated.  Small frequent sips of fluids- Pedialyte, Gatorade, water, broth- to maintain hydration.   Your loose stools should continue to gradually improve over the next 5-7 days.  Please return for any worsening of abdominal pain, fevers, blood or black in your stool or no improvement in the next two weeks.  Please use condoms to prevent STDs

## 2019-01-17 LAB — URINE CULTURE: Culture: NO GROWTH

## 2019-01-17 LAB — CYTOLOGY, (ORAL, ANAL, URETHRAL) ANCILLARY ONLY
Chlamydia: NEGATIVE
Neisseria Gonorrhea: NEGATIVE
Trichomonas: NEGATIVE

## 2019-03-01 ENCOUNTER — Ambulatory Visit (HOSPITAL_COMMUNITY)
Admission: EM | Admit: 2019-03-01 | Discharge: 2019-03-01 | Disposition: A | Discharge status: Home / Self Care | Payer: BC Managed Care – PPO | Attending: Family Medicine | Admitting: Family Medicine

## 2019-03-01 ENCOUNTER — Other Ambulatory Visit: Payer: Self-pay

## 2019-03-01 ENCOUNTER — Encounter (HOSPITAL_COMMUNITY): Payer: Self-pay

## 2019-03-01 DIAGNOSIS — R300 Dysuria: Secondary | ICD-10-CM | POA: Insufficient documentation

## 2019-03-01 LAB — POCT URINALYSIS DIP (DEVICE)
Glucose, UA: NEGATIVE mg/dL
Hgb urine dipstick: NEGATIVE
Ketones, ur: NEGATIVE mg/dL
Leukocytes,Ua: NEGATIVE
Nitrite: NEGATIVE
Protein, ur: NEGATIVE mg/dL
Specific Gravity, Urine: >=1.03 (ref 1.005–1.030)
Urobilinogen, UA: 1 mg/dL (ref 0.0–1.0)
pH: 6 (ref 5.0–8.0)

## 2019-03-01 NOTE — ED Provider Notes (Signed)
[facility]    CSN: [id number] Arrival date & time: 03/01/19  1914      History   Chief Complaint Chief Complaint  Patient presents with  . Penile Discharge    HPI Danny Mccoy is a 34 y.o. male.   Patient is a 34 year old male presents today with dysuria.  This started approximately 2 days ago after an oral sex.  Symptoms have been constant.  Reporting he has had some mild penile discharge.  Patient has been seen for similar symptoms multiple times over the last 6 months.  Has always had negative STD screenings but has had UTIs in the past.  Reported he saw a urologist approximate 1 month ago and was treated for prostatitis.  Denies any urinary frequency, hematuria, urinary incontinence or dribbling of urine.  Denies any rectal pressure or pain.  Denies any testicle pain or swelling.  Denies any rashes.  ROS per HPI      History reviewed. No pertinent past medical history.  Patient Active Problem List   Diagnosis Date Noted  . CONSTIPATION 06/30/2007    History reviewed. No pertinent surgical history.     Home Medications    Prior to Admission medications   Medication Sig Start Date End Date Taking? Authorizing Provider  ciprofloxacin (CIPRO) 500 MG tablet Take 1 tablet (500 mg total) by mouth 2 (two) times daily. 11/30/18   Wallis Bamberg, PA-C  naproxen (NAPROSYN) 500 MG tablet Take 1 tablet (500 mg total) by mouth 2 (two) times daily. 08/10/18   Wallis Bamberg, PA-C  fluticasone (FLONASE) 50 MCG/ACT nasal spray Place 1 spray into both nostrils daily. Patient not taking: Reported on 08/10/2018 04/12/17 08/10/18  Georgetta Haber, NP    Family History Family History  Problem Relation Age of Onset  . Healthy Mother   . Healthy Father     Social History Social History   Tobacco Use  . Smoking status: Former Smoker    Types: Cigarettes, Cigars    Quit date: 01/10/2019    Years since quitting: 0.1  . Smokeless tobacco: Never Used  . Tobacco comment: black  and mild 2x/week  Substance Use Topics  . Alcohol use: Yes    Comment: occ  . Drug use: No     Allergies   Patient has no known allergies.   Review of Systems Review of Systems   Physical Exam Triage Vital Signs ED Triage Vitals  Enc Vitals Group     BP 03/01/19 0848 132/63     Pulse Rate 03/01/19 0848 63     Resp 03/01/19 0848 18     Temp 03/01/19 0848 97.8 F (36.6 C)     Temp Source 03/01/19 0848 Oral     SpO2 03/01/19 0848 98 %     Weight 03/01/19 0848 209 lb 6.4 oz (95 kg)     Height --      Head Circumference --      Peak Flow --      Pain Score 03/01/19 0847 10     Pain Loc --      Pain Edu? --      Excl. in GC? --    No data found.  Updated Vital Signs BP 132/63 (BP Location: Right Arm)   Pulse 63   Temp 97.8 F (36.6 C) (Oral)   Resp 18   Wt 209 lb 6.4 oz (95 kg)   SpO2 98%   BMI 28.40 kg/m   Visual Acuity Right Eye  Distance:   Left Eye Distance:   Bilateral Distance:    Right Eye Near:   Left Eye Near:    Bilateral Near:     Physical Exam Vitals and nursing note reviewed.  Constitutional:      Appearance: Normal appearance.  HENT:     Head: Normocephalic and atraumatic.     Nose: Nose normal.  Eyes:     Conjunctiva/sclera: Conjunctivae normal.  Pulmonary:     Effort: Pulmonary effort is normal.  Musculoskeletal:        General: Normal range of motion.     Cervical back: Normal range of motion.  Skin:    General: Skin is warm and dry.  Neurological:     Mental Status: He is alert.  Psychiatric:        Mood and Affect: Mood normal.      UC Treatments / Results  Labs (all labs ordered are listed, but only abnormal results are displayed) Labs Reviewed  POCT URINALYSIS DIP (DEVICE) - Abnormal; Notable for the following components:      Result Value   Bilirubin Urine SMALL (*)    All other components within normal limits  CYTOLOGY, (ORAL, ANAL, URETHRAL) ANCILLARY ONLY    EKG   Radiology No results  found.  Procedures Procedures (including critical care time)  Medications Ordered in UC Medications - No data to display  Initial Impression / Assessment and Plan / UC Course  I have reviewed the triage vital signs and the nursing notes.  Pertinent labs & imaging results that were available during my care of the patient were reviewed by me and considered in my medical decision making (see chart for details).     Dysuria-urine negative for infection Not convinced of prostatitis today. We will send a swab for STD screening. Opting to wait for treatment based on history of multiple STD screenings with negative results. Recommended follow back up with urology Final Clinical Impressions(s) / UC Diagnoses   Final diagnoses:  Dysuria     Discharge Instructions     Your urine did not show any infection  We will send your swab for testing and call you with any positive results.  I am not treating you today based on your history if negative STD results.  We will treat based on swab.  I am concerned that if you keep getting unnecessary antibiotics that your body will get resistant to these antibiotics and when you really need treatment and does not go to work.     ED Prescriptions    None     PDMP not reviewed this encounter.   Orvan July, NP 03/01/19 1624

## 2019-03-01 NOTE — ED Triage Notes (Signed)
Pt states he wants to be treated for STDs. Pt states he has penile discharge x 2 days.

## 2019-03-01 NOTE — Discharge Instructions (Signed)
Your urine did not show any infection  We will send your swab for testing and call you with any positive results.  I am not treating you today based on your history if negative STD results.  We will treat based on swab.  I am concerned that if you keep getting unnecessary antibiotics that your body will get resistant to these antibiotics and when you really need treatment and does not go to work.

## 2019-03-02 LAB — CYTOLOGY, (ORAL, ANAL, URETHRAL) ANCILLARY ONLY
Chlamydia: NEGATIVE
Neisseria Gonorrhea: NEGATIVE

## 2019-03-04 ENCOUNTER — Telehealth (HOSPITAL_COMMUNITY): Payer: Self-pay

## 2019-06-07 ENCOUNTER — Encounter (HOSPITAL_COMMUNITY): Payer: Self-pay

## 2019-06-07 ENCOUNTER — Ambulatory Visit (HOSPITAL_COMMUNITY)
Admission: EM | Admit: 2019-06-07 | Discharge: 2019-06-07 | Disposition: A | Discharge status: Home / Self Care | Payer: BC Managed Care – PPO | Attending: Urgent Care | Admitting: Urgent Care

## 2019-06-07 ENCOUNTER — Other Ambulatory Visit: Payer: Self-pay

## 2019-06-07 DIAGNOSIS — K529 Noninfective gastroenteritis and colitis, unspecified: Secondary | ICD-10-CM

## 2019-06-07 DIAGNOSIS — R197 Diarrhea, unspecified: Secondary | ICD-10-CM | POA: Diagnosis not present

## 2019-06-07 DIAGNOSIS — Z20822 Contact with and (suspected) exposure to covid-19: Secondary | ICD-10-CM | POA: Diagnosis not present

## 2019-06-07 DIAGNOSIS — R110 Nausea: Secondary | ICD-10-CM

## 2019-06-07 DIAGNOSIS — Z87891 Personal history of nicotine dependence: Secondary | ICD-10-CM | POA: Insufficient documentation

## 2019-06-07 MED ORDER — ONDANSETRON 8 MG PO TBDP: 8.0000 mg | ORAL_TABLET | Freq: Three times a day (TID) | ORAL | 0 refills | Status: DC | PRN

## 2019-06-07 MED ORDER — LOPERAMIDE HCL 2 MG PO CAPS: 2.0000 mg | ORAL_CAPSULE | Freq: Every day | ORAL | 0 refills | Status: DC | PRN

## 2019-06-07 NOTE — ED Triage Notes (Signed)
Pt c/o 5/10 bloating pain in mid abdomen and diarrhea started yesterday. Pt states had 5 bouts of diarrhea in past 24hrs. Pt denies N/V.

## 2019-06-07 NOTE — ED Provider Notes (Signed)
[facility]   MRN: [id number] DOB: 1986-01-26  Subjective:   Danny Mccoy is a 34 y.o. male presenting for 2-day history of acute onset diarrhea, nausea without vomiting.  Has eaten out a lot lately, all kinds of foods, restaurant foods.  He is not currently taking any medications and has no known food or drug allergies.  Denies past medical and surgical history.  Family History  Problem Relation Age of Onset  . Healthy Mother   . Healthy Father     Social History   Tobacco Use  . Smoking status: Former Smoker    Types: Cigarettes, Cigars    Quit date: 01/10/2019    Years since quitting: 0.4  . Smokeless tobacco: Never Used  . Tobacco comment: black and mild 2x/week  Substance Use Topics  . Alcohol use: Yes    Comment: occ  . Drug use: No    Review of Systems  Constitutional: Negative for fever and malaise/fatigue.  HENT: Negative for congestion, ear pain, sinus pain and sore throat.   Eyes: Negative for blurred vision, double vision, discharge and redness.  Respiratory: Negative for cough, hemoptysis, shortness of breath and wheezing.   Cardiovascular: Negative for chest pain.  Gastrointestinal: Negative for abdominal pain, diarrhea, nausea and vomiting.  Genitourinary: Negative for dysuria, flank pain and hematuria.  Musculoskeletal: Negative for myalgias.  Skin: Negative for rash.  Neurological: Negative for dizziness, weakness and headaches.  Psychiatric/Behavioral: Negative for depression and substance abuse.     Objective:   Vitals: BP 119/77   Pulse (!) 56   Temp 98.2 F (36.8 C) (Oral)   Resp 16   Ht 6' (1.829 m)   Wt 205 lb (93 kg)   SpO2 100%   BMI 27.80 kg/m   Physical Exam Constitutional:      General: He is not in acute distress.    Appearance: Normal appearance. He is well-developed. He is not ill-appearing, toxic-appearing or diaphoretic.  HENT:     Head: Normocephalic and atraumatic.     Right Ear: External ear normal.    Left Ear: External ear normal.     Nose: Nose normal.     Mouth/Throat:     Mouth: Mucous membranes are moist.     Pharynx: Oropharynx is clear.  Eyes:     General: No scleral icterus.    Extraocular Movements: Extraocular movements intact.     Pupils: Pupils are equal, round, and reactive to light.  Cardiovascular:     Rate and Rhythm: Normal rate and regular rhythm.     Heart sounds: Normal heart sounds. No murmur. No friction rub. No gallop.   Pulmonary:     Effort: Pulmonary effort is normal. No respiratory distress.     Breath sounds: Normal breath sounds. No stridor. No wheezing, rhonchi or rales.  Abdominal:     General: Bowel sounds are normal. There is no distension.     Palpations: Abdomen is soft. There is no mass.     Tenderness: There is no abdominal tenderness. There is no guarding or rebound.  Skin:    General: Skin is warm and dry.  Neurological:     Mental Status: He is alert and oriented to person, place, and time.  Psychiatric:        Mood and Affect: Mood normal.        Behavior: Behavior normal.        Thought Content: Thought content normal.     Assessment and Plan :  PDMP not reviewed this encounter.  1. Diarrhea, unspecified type   2. Nausea   3. Gastroenteritis     COVID 19 testing, will manage for suspected viral gastroenteritis with supportive care.  Recommended patient hydrate well, eat light meals and maintain electrolytes.  Will use Zofran and Imodium for nausea, vomiting and diarrhea. Counseled patient on potential for adverse effects with medications prescribed/recommended today, ER and return-to-clinic precautions discussed, patient verbalized understanding.    Wallis Bamberg, PA-C 06/07/19 1321

## 2019-06-07 NOTE — Discharge Instructions (Signed)

## 2019-06-08 LAB — SARS CORONAVIRUS 2 (TAT 6-24 HRS): SARS Coronavirus 2: NEGATIVE

## 2019-08-17 ENCOUNTER — Encounter (HOSPITAL_COMMUNITY): Payer: Self-pay

## 2019-08-17 ENCOUNTER — Other Ambulatory Visit: Payer: Self-pay

## 2019-08-17 ENCOUNTER — Ambulatory Visit (HOSPITAL_COMMUNITY)
Admission: EM | Admit: 2019-08-17 | Discharge: 2019-08-17 | Disposition: A | Discharge status: Home / Self Care | Payer: BC Managed Care – PPO | Attending: Physician Assistant | Admitting: Physician Assistant

## 2019-08-17 DIAGNOSIS — Z791 Long term (current) use of non-steroidal anti-inflammatories (NSAID): Secondary | ICD-10-CM | POA: Diagnosis not present

## 2019-08-17 DIAGNOSIS — Z20822 Contact with and (suspected) exposure to covid-19: Secondary | ICD-10-CM | POA: Insufficient documentation

## 2019-08-17 DIAGNOSIS — Z87891 Personal history of nicotine dependence: Secondary | ICD-10-CM | POA: Insufficient documentation

## 2019-08-17 DIAGNOSIS — Z79899 Other long term (current) drug therapy: Secondary | ICD-10-CM | POA: Diagnosis not present

## 2019-08-17 DIAGNOSIS — B349 Viral infection, unspecified: Secondary | ICD-10-CM | POA: Diagnosis present

## 2019-08-17 MED ORDER — ACETAMINOPHEN 325 MG PO TABS: 650.0000 mg | ORAL_TABLET | Freq: Four times a day (QID) | ORAL | 0 refills | Status: DC | PRN

## 2019-08-17 MED ORDER — BENZONATATE 100 MG PO CAPS: 100.0000 mg | ORAL_CAPSULE | Freq: Three times a day (TID) | ORAL | 0 refills | Status: DC

## 2019-08-17 MED ORDER — IBUPROFEN 400 MG PO TABS: 400.0000 mg | ORAL_TABLET | Freq: Four times a day (QID) | ORAL | 0 refills | Status: DC | PRN

## 2019-08-17 MED ORDER — CEPACOL SORE THROAT 5.4 MG MT LOZG: 1.0000 | LOZENGE | OROMUCOSAL | 0 refills | Status: DC | PRN

## 2019-08-17 NOTE — ED Triage Notes (Signed)
Pt reports having fever at home, tmax 100, body aches, sore throat.  No covid contacts.

## 2019-08-17 NOTE — ED Provider Notes (Signed)
[facility]    CSN: [id number] Arrival date & time: 08/17/19  1614      History   Chief Complaint Chief Complaint  Patient presents with  . Fatigue    HPI Danny Mccoy is a 34 y.o. male.   Patient presents for fatigue, fever and sore throat. Symptoms started suddenly today. Reports lots of body aches. Reports sore throat has been scratchy but not severe. Mild congestion reported.  Reports Slight Cough. Denies shortness of breath. Denies nausea, vomiting, diarrhea.  Endorses some headache. Denies sick contacts.   Patient states he cant not tolerate things going deep in his nose.     History reviewed. No pertinent past medical history.  Patient Active Problem List   Diagnosis Date Noted  . CONSTIPATION 06/30/2007    History reviewed. No pertinent surgical history.     Home Medications    Prior to Admission medications   Medication Sig Start Date End Date Taking? Authorizing Provider  acetaminophen (TYLENOL) 325 MG tablet Take 2 tablets (650 mg total) by mouth every 6 (six) hours as needed. 08/17/19   Nyan Dufresne, Veryl Speak, PA-C  benzonatate (TESSALON) 100 MG capsule Take 1 capsule (100 mg total) by mouth every 8 (eight) hours. 08/17/19   Pleasant Britz, Veryl Speak, PA-C  ibuprofen (ADVIL) 400 MG tablet Take 1 tablet (400 mg total) by mouth every 6 (six) hours as needed. 08/17/19   Virgilio Broadhead, Veryl Speak, PA-C  loperamide (IMODIUM) 2 MG capsule Take 1 capsule (2 mg total) by mouth daily as needed for diarrhea or loose stools. 06/07/19   Wallis Bamberg, PA-C  Menthol (CEPACOL SORE THROAT) 5.4 MG LOZG Use as directed 1 lozenge (5.4 mg total) in the mouth or throat every 2 (two) hours as needed. 08/17/19   Jennalee Greaves, Veryl Speak, PA-C  ondansetron (ZOFRAN-ODT) 8 MG disintegrating tablet Take 1 tablet (8 mg total) by mouth every 8 (eight) hours as needed for nausea or vomiting. 06/07/19   Wallis Bamberg, PA-C  fluticasone [facility]) 50 MCG/ACT nasal spray Place 1 spray into both nostrils daily. Patient not taking:  Reported on 08/10/2018 04/12/17 08/10/18  Georgetta Haber, NP    Family History Family History  Problem Relation Age of Onset  . Healthy Mother   . Healthy Father     Social History Social History   Tobacco Use  . Smoking status: Former Smoker    Types: Cigarettes, Cigars    Quit date: 01/10/2019    Years since quitting: 0.6  . Smokeless tobacco: Never Used  . Tobacco comment: black and mild 2x/week  Vaping Use  . Vaping Use: Never used  Substance Use Topics  . Alcohol use: Yes    Comment: occ  . Drug use: No     Allergies   Patient has no known allergies.   Review of Systems Review of Systems   Physical Exam Triage Vital Signs ED Triage Vitals  Enc Vitals Group     BP 08/17/19 1710 123/80     Pulse Rate 08/17/19 1710 66     Resp 08/17/19 1710 16     Temp 08/17/19 1710 99.6 F (37.6 C)     Temp Source 08/17/19 1710 Oral     SpO2 08/17/19 1710 100 %     Weight 08/17/19 1712 210 lb (95.3 kg)     Height 08/17/19 1712 5\' 11"  (1.803 m)     Head Circumference --      Peak Flow --      Pain Score  08/17/19 1712 10     Pain Loc --      Pain Edu? --      Excl. in GC? --    No data found.  Updated Vital Signs BP 123/80 (BP Location: Right Arm)   Pulse 66   Temp 99.6 F (37.6 C) (Oral)   Resp 16   Ht 5\' 11"  (1.803 m)   Wt 210 lb (95.3 kg)   SpO2 100%   BMI 29.29 kg/m   Visual Acuity Right Eye Distance:   Left Eye Distance:   Bilateral Distance:    Right Eye Near:   Left Eye Near:    Bilateral Near:     Physical Exam Vitals and nursing note reviewed.  Constitutional:      General: He is not in acute distress.    Appearance: He is well-developed. He is not ill-appearing, toxic-appearing or diaphoretic.  HENT:     Head: Normocephalic and atraumatic.     Mouth/Throat:     Mouth: Mucous membranes are moist.     Pharynx: Oropharynx is clear.     Comments: Postnasal drip. Eyes:     Conjunctiva/sclera: Conjunctivae normal.     Pupils: Pupils are  equal, round, and reactive to light.  Cardiovascular:     Rate and Rhythm: Normal rate and regular rhythm.     Heart sounds: No murmur heard.   Pulmonary:     Effort: Pulmonary effort is normal. No respiratory distress.     Breath sounds: Normal breath sounds. No wheezing, rhonchi or rales.  Abdominal:     Palpations: Abdomen is soft.     Tenderness: There is no abdominal tenderness.  Musculoskeletal:     Cervical back: Neck supple.     Right lower leg: No edema.     Left lower leg: No edema.  Skin:    General: Skin is warm and dry.  Neurological:     Mental Status: He is alert.      UC Treatments / Results  Labs (all labs ordered are listed, but only abnormal results are displayed) Labs Reviewed  SARS CORONAVIRUS 2 (TAT 6-24 HRS)    EKG   Radiology No results found.  Procedures Procedures (including critical care time)  Medications Ordered in UC Medications - No data to display  Initial Impression / Assessment and Plan / UC Course  I have reviewed the triage vital signs and the nursing notes.  Pertinent labs & imaging results that were available during my care of the patient were reviewed by me and considered in my medical decision making (see chart for details).     Viral illness Patient is a 34 year old gentleman presenting with viral illness.  Afebrile and well-appearing in clinic.  Reassuring exam.  We will treat symptomatically.  Tylenol ibuprofen for body ache and fever, Tessalon for cough because of sore throat.  Covid PCR sent.  Return and fall precautions were discussed.  Emergency department precautions were discussed.  Patient verbalized understanding plan of care. Final Clinical Impressions(s) / UC Diagnoses   Final diagnoses:  Viral illness     Discharge Instructions     Take the tylenole and ibuprofen every 6 hours for fever and bodyache Take the tessalon for cough Use the lozenge for sore throat  If not improving over the next 1 week,  return. If significantly worsening with high fever above 103, shortness of breath or severe belly pain, return or go to the ED  If your Covid-19 test is positive,  you will receive a phone call from [facility] regarding your results. Negative test results are not called. Both positive and negative results area always visible on MyChart. If you do not have a MyChart account, sign up instructions are in your discharge papers.   Persons who are directed to care for themselves at home may discontinue isolation under the following conditions:  . At least 10 days have passed since symptom onset and . At least 24 hours have passed without running a fever (this means without the use of fever-reducing medications) and . Other symptoms have improved.  Persons infected with COVID-19 who never develop symptoms may discontinue isolation and other precautions 10 days after the date of their first positive COVID-19 test.      ED Prescriptions    Medication Sig Dispense Auth. Provider   acetaminophen (TYLENOL) 325 MG tablet Take 2 tablets (650 mg total) by mouth every 6 (six) hours as needed. 30 tablet Taquana Bartley, Veryl Speak, PA-C   ibuprofen (ADVIL) 400 MG tablet Take 1 tablet (400 mg total) by mouth every 6 (six) hours as needed. 30 tablet Loana Salvaggio, Veryl Speak, PA-C   benzonatate (TESSALON) 100 MG capsule Take 1 capsule (100 mg total) by mouth every 8 (eight) hours. 21 capsule Jamise Pentland, Veryl Speak, PA-C   Menthol (CEPACOL SORE THROAT) 5.4 MG LOZG Use as directed 1 lozenge (5.4 mg total) in the mouth or throat every 2 (two) hours as needed. 30 lozenge Cala Kruckenberg, Veryl Speak, PA-C     PDMP not reviewed this encounter.   Hermelinda Medicus, PA-C 08/17/19 2330

## 2019-08-17 NOTE — Discharge Instructions (Signed)
Take the tylenole and ibuprofen every 6 hours for fever and bodyache Take the tessalon for cough Use the lozenge for sore throat  If not improving over the next 1 week, return. If significantly worsening with high fever above 103, shortness of breath or severe belly pain, return or go to the ED  If your Covid-19 test is positive, you will receive a phone call from [facility] regarding your results. Negative test results are not called. Both positive and negative results area always visible on MyChart. If you do not have a MyChart account, sign up instructions are in your discharge papers.   Persons who are directed to care for themselves at home may discontinue isolation under the following conditions:   At least 10 days have passed since symptom onset and  At least 24 hours have passed without running a fever (this means without the use of fever-reducing medications) and  Other symptoms have improved.  Persons infected with COVID-19 who never develop symptoms may discontinue isolation and other precautions 10 days after the date of their first positive COVID-19 test.

## 2019-08-18 LAB — SARS CORONAVIRUS 2 (TAT 6-24 HRS): SARS Coronavirus 2: NEGATIVE

## 2019-08-23 ENCOUNTER — Telehealth (HOSPITAL_COMMUNITY): Payer: Self-pay | Admitting: Emergency Medicine

## 2019-08-23 NOTE — Telephone Encounter (Signed)
Patient called to receive COVID result.  Confirmed identity using two identifiers and provided negative result.  Patient verbalized understanding.  No questions at this time.

## 2019-08-24 ENCOUNTER — Other Ambulatory Visit: Payer: Self-pay

## 2019-08-24 ENCOUNTER — Encounter (HOSPITAL_COMMUNITY): Payer: Self-pay | Admitting: Emergency Medicine

## 2019-08-24 ENCOUNTER — Emergency Department (HOSPITAL_COMMUNITY)
Admission: EM | Admit: 2019-08-24 | Discharge: 2019-08-24 | Disposition: A | Discharge status: Home / Self Care | Payer: BC Managed Care – PPO | Attending: Emergency Medicine | Admitting: Emergency Medicine

## 2019-08-24 DIAGNOSIS — Z87891 Personal history of nicotine dependence: Secondary | ICD-10-CM | POA: Insufficient documentation

## 2019-08-24 DIAGNOSIS — R369 Urethral discharge, unspecified: Secondary | ICD-10-CM | POA: Diagnosis not present

## 2019-08-24 MED ORDER — STERILE WATER FOR INJECTION IJ SOLN
INTRAMUSCULAR | Status: AC
  Filled 2019-08-24: qty 10

## 2019-08-24 MED ORDER — DOXYCYCLINE HYCLATE 100 MG PO CAPS: 100.0000 mg | ORAL_CAPSULE | Freq: Two times a day (BID) | ORAL | 0 refills | Status: AC

## 2019-08-24 MED ORDER — CEFTRIAXONE SODIUM 500 MG IJ SOLR
500.0000 mg | Freq: Once | INTRAMUSCULAR | Status: AC
  Administered 2019-08-24: 500 mg via INTRAMUSCULAR
  Filled 2019-08-24: qty 500

## 2019-08-24 NOTE — Discharge Instructions (Addendum)
You were evaluated in the Emergency Department and after careful evaluation, we did not find any emergent condition requiring admission or further testing in the hospital.  Your exam/testing today is overall reassuring.  Your symptoms seem to be due to a sexually transmitted disease.  Please complete the course of doxycycline antibiotic as directed over the next 2 weeks, avoid any sexual contact during that time and please inform any of your sexual partners that you are being treated.  Please return to the Emergency Department if you experience any worsening of your condition.   Thank you for allowing [place] to be a part of your care.

## 2019-08-24 NOTE — ED Provider Notes (Signed)
[facility] Emergency Department Provider Note MRN:  [id number]  Arrival date & time: 08/24/19     Chief Complaint   Penile Discharge   History of Present Illness   Danny Mccoy is a 34 y.o. year-old male with no pertinent past medical history presenting to the ED with chief complaint of penile discharge.  New sexual partner 3 days ago, penile discharge for the past 24 to 48 hours.  Described as a mild amount of discharge.  Denies fever, no swelling, no rash, no joint pain, no other complaints.  Review of Systems  A problem-focused ROS was performed. Positive for penile discharge.  Patient denies fever.  Patient's Health History   History reviewed. No pertinent past medical history.  History reviewed. No pertinent surgical history.  Family History  Problem Relation Age of Onset  . Healthy Mother   . Healthy Father     Social History   Socioeconomic History  . Marital status: Single    Spouse name: Not on file  . Number of children: Not on file  . Years of education: Not on file  . Highest education level: Not on file  Occupational History  . Not on file  Tobacco Use  . Smoking status: Former Smoker    Types: Cigarettes, Cigars    Quit date: 01/10/2019    Years since quitting: 0.6  . Smokeless tobacco: Never Used  . Tobacco comment: black and mild 2x/week  Vaping Use  . Vaping Use: Never used  Substance and Sexual Activity  . Alcohol use: Yes    Comment: occ  . Drug use: No  . Sexual activity: Yes    Birth control/protection: Condom  Other Topics Concern  . Not on file  Social History Narrative  . Not on file   Social Determinants of Health   Financial Resource Strain:   . Difficulty of Paying Living Expenses:   Food Insecurity:   . Worried About Programme researcher, broadcasting/film/video in the Last Year:   . Barista in the Last Year:   Transportation Needs:   . Freight forwarder (Medical):   Marland Kitchen Lack of Transportation (Non-Medical):     Physical Activity:   . Days of Exercise per Week:   . Minutes of Exercise per Session:   Stress:   . Feeling of Stress :   Social Connections:   . Frequency of Communication with Friends and Family:   . Frequency of Social Gatherings with Friends and Family:   . Attends Religious Services:   . Active Member of Clubs or Organizations:   . Attends Banker Meetings:   Marland Kitchen Marital Status:   Intimate Partner Violence:   . Fear of Current or Ex-Partner:   . Emotionally Abused:   Marland Kitchen Physically Abused:   . Sexually Abused:      Physical Exam   Vitals:   08/24/19 0755  BP: 117/67  Pulse: (!) 59  Resp: 14  Temp: 98.2 F (36.8 C)  SpO2: 97%    CONSTITUTIONAL: Well-appearing, NAD NEURO:  Alert and oriented x 3, no focal deficits EYES:  eyes equal and reactive ENT/NECK:  no LAD, no JVD CARDIO: Regular rate, well-perfused, normal S1 and S2 PULM:  CTAB no wheezing or rhonchi GI/GU:  normal bowel sounds, non-distended, non-tender; normal appearing external genitalia, no visible discharge, no lymphadenopathy, no scrotal pain or tenderness MSK/SPINE:  No gross deformities, no edema SKIN:  no rash, atraumatic PSYCH:  Appropriate speech and behavior  *  Additional and/or pertinent findings included in MDM below  Diagnostic and Interventional Summary    EKG Interpretation  Date/Time:    Ventricular Rate:    PR Interval:    QRS Duration:   QT Interval:    QTC Calculation:   R Axis:     Text Interpretation:        Labs Reviewed  GC/CHLAMYDIA PROBE AMP ([place]) NOT AT [facility]    No orders to display    Medications  cefTRIAXone (ROCEPHIN) injection 500 mg (has no administration in time range)     Procedures  /  Critical Care Procedures  ED Course and Medical Decision Making  I have reviewed the triage vital signs, the nursing notes, and pertinent available records from the EMR.  Listed above are laboratory and imaging tests that I personally ordered,  reviewed, and interpreted and then considered in my medical decision making (see below for details).      Empirically treating for STD, no signs of systemic illness, advised condom use.    Elmer Sow. Pilar Plate, MD [facility] Emergency Medicine [facility] [email or web address]  Final Clinical Impressions(s) / ED Diagnoses     ICD-10-CM   1. Penile discharge  R36.9     ED Discharge Orders         Ordered    doxycycline (VIBRAMYCIN) 100 MG capsule  2 times daily     Discontinue  Reprint     08/24/19 0858           Discharge Instructions Discussed with and Provided to Patient:     Discharge Instructions     You were evaluated in the Emergency Department and after careful evaluation, we did not find any emergent condition requiring admission or further testing in the hospital.  Your exam/testing today is overall reassuring.  Your symptoms seem to be due to a sexually transmitted disease.  Please complete the course of doxycycline antibiotic as directed over the next 2 weeks, avoid any sexual contact during that time and please inform any of your sexual partners that you are being treated.  Please return to the Emergency Department if you experience any worsening of your condition.   Thank you for allowing [place] to be a part of your care.      Sabas Sous, MD 08/24/19 0900

## 2019-08-24 NOTE — ED Triage Notes (Signed)
C/o penile discharge x 2-3 days after having intercourse.

## 2019-08-25 LAB — GC/CHLAMYDIA PROBE AMP (~~LOC~~) NOT AT ARMC
Chlamydia: NEGATIVE
Comment: NEGATIVE
Comment: NORMAL
Neisseria Gonorrhea: NEGATIVE

## 2019-10-02 ENCOUNTER — Ambulatory Visit (HOSPITAL_COMMUNITY)
Admission: EM | Admit: 2019-10-02 | Discharge: 2019-10-02 | Disposition: A | Discharge status: Home / Self Care | Payer: BC Managed Care – PPO | Attending: Urgent Care | Admitting: Urgent Care

## 2019-10-02 ENCOUNTER — Other Ambulatory Visit: Payer: Self-pay

## 2019-10-02 ENCOUNTER — Encounter (HOSPITAL_COMMUNITY): Payer: Self-pay

## 2019-10-02 DIAGNOSIS — Z20822 Contact with and (suspected) exposure to covid-19: Secondary | ICD-10-CM | POA: Insufficient documentation

## 2019-10-02 DIAGNOSIS — R1310 Dysphagia, unspecified: Secondary | ICD-10-CM

## 2019-10-02 DIAGNOSIS — R509 Fever, unspecified: Secondary | ICD-10-CM | POA: Insufficient documentation

## 2019-10-02 DIAGNOSIS — Z87891 Personal history of nicotine dependence: Secondary | ICD-10-CM | POA: Diagnosis not present

## 2019-10-02 DIAGNOSIS — Z87438 Personal history of other diseases of male genital organs: Secondary | ICD-10-CM | POA: Diagnosis not present

## 2019-10-02 DIAGNOSIS — J029 Acute pharyngitis, unspecified: Secondary | ICD-10-CM | POA: Diagnosis not present

## 2019-10-02 DIAGNOSIS — R070 Pain in throat: Secondary | ICD-10-CM | POA: Diagnosis not present

## 2019-10-02 DIAGNOSIS — R369 Urethral discharge, unspecified: Secondary | ICD-10-CM | POA: Diagnosis not present

## 2019-10-02 LAB — POCT URINALYSIS DIPSTICK, ED / UC
Bilirubin Urine: NEGATIVE
Glucose, UA: NEGATIVE mg/dL
Ketones, ur: NEGATIVE mg/dL
Leukocytes,Ua: NEGATIVE
Nitrite: NEGATIVE
Protein, ur: NEGATIVE mg/dL
Specific Gravity, Urine: >=1.03 (ref 1.005–1.030)
Urobilinogen, UA: 0.2 mg/dL (ref 0.0–1.0)
pH: 6.5 (ref 5.0–8.0)

## 2019-10-02 LAB — SARS CORONAVIRUS 2 (TAT 6-24 HRS): SARS Coronavirus 2: NEGATIVE

## 2019-10-02 MED ORDER — ACETAMINOPHEN 325 MG PO TABS
650.0000 mg | ORAL_TABLET | Freq: Once | ORAL | Status: AC
  Administered 2019-10-02: 650 mg via ORAL

## 2019-10-02 MED ORDER — ACETAMINOPHEN 325 MG PO TABS
ORAL_TABLET | ORAL | Status: AC
  Filled 2019-10-02: qty 2

## 2019-10-02 MED ORDER — AMOXICILLIN 500 MG PO CAPS: 500.0000 mg | ORAL_CAPSULE | Freq: Two times a day (BID) | ORAL | 0 refills | Status: DC

## 2019-10-02 MED ORDER — NAPROXEN 500 MG PO TABS: 500.0000 mg | ORAL_TABLET | Freq: Two times a day (BID) | ORAL | 0 refills | Status: DC

## 2019-10-02 NOTE — ED Notes (Signed)
Accessed patient's chart to obtain the orders for the covid specimen

## 2019-10-02 NOTE — ED Notes (Signed)
Pt refused COVID swab

## 2019-10-02 NOTE — ED Triage Notes (Signed)
Pt c/o fever, sore throat, dysphagia, body aches, and penile irritation, clear penile dischargex3 days.

## 2019-10-02 NOTE — ED Provider Notes (Signed)
[facility]   MRN: [id number] DOB: [birth year]  Subjective:   Danny Mccoy is a 34 y.o. male presenting for 3-day history of fever, throat pain, difficulty swallowing, body aches.  Has also had recurrent penile discharge and irritation.  Patient has had multiple cytology test done this past year.  Last positive test was for gonorrhea on 08/10/2018.  His tests were negative in 2016-2019.  He did have a history of a Klebsiella urinary tract infection seen on urine culture in October 2020.  Patient did take some ibuprofen today prior to his arrival.  No current facility-administered medications for this encounter.  Current Outpatient Medications:    acetaminophen (TYLENOL) 325 MG tablet, Take 2 tablets (650 mg total) by mouth every 6 (six) hours as needed., Disp: 30 tablet, Rfl: 0   ibuprofen (ADVIL) 400 MG tablet, Take 1 tablet (400 mg total) by mouth every 6 (six) hours as needed., Disp: 30 tablet, Rfl: 0   No Known Allergies  History reviewed. No pertinent past medical history.   History reviewed. No pertinent surgical history.  Family History  Problem Relation Age of Onset   Healthy Mother    Healthy Father     Social History   Tobacco Use   Smoking status: Former Smoker    Types: Cigarettes, Cigars    Quit date: 01/10/2019    Years since quitting: 0.7   Smokeless tobacco: Never Used   Tobacco comment: black and mild 2x/week  Vaping Use   Vaping Use: Never used  Substance Use Topics   Alcohol use: Yes    Comment: occ   Drug use: No    ROS   Objective:   Vitals: BP 111/73    Pulse 76    Temp 100 F (37.8 C) (Oral)    Resp 16    Ht 6' (1.829 m)    Wt 200 lb (90.7 kg)    SpO2 99%    BMI 27.12 kg/m   Physical Exam Constitutional:      General: He is not in acute distress.    Appearance: Normal appearance. He is well-developed. He is ill-appearing. He is not toxic-appearing or diaphoretic.  HENT:     Head: Normocephalic and atraumatic.      Right Ear: External ear normal.     Left Ear: External ear normal.     Nose: Nose normal.     Mouth/Throat:     Mouth: Mucous membranes are moist.     Pharynx: Posterior oropharyngeal erythema (with 1+ swelling of tonsils bilaterally) present. No oropharyngeal exudate.  Eyes:     General: No scleral icterus.       Right eye: No discharge.        Left eye: No discharge.     Extraocular Movements: Extraocular movements intact.     Pupils: Pupils are equal, round, and reactive to light.  Cardiovascular:     Rate and Rhythm: Normal rate and regular rhythm.     Heart sounds: Normal heart sounds. No murmur heard.  No friction rub. No gallop.   Pulmonary:     Effort: Pulmonary effort is normal. No respiratory distress.     Breath sounds: Normal breath sounds. No stridor. No wheezing, rhonchi or rales.  Neurological:     Mental Status: He is alert and oriented to person, place, and time.  Psychiatric:        Mood and Affect: Mood normal.        Behavior: Behavior normal.  Thought Content: Thought content normal.        Judgment: Judgment normal.     Results for orders placed or performed during the hospital encounter of 10/02/19 (from the past 24 hour(s))  POC Urinalysis dipstick     Status: Abnormal   Collection Time: 10/02/19  2:02 PM  Result Value Ref Range   Glucose, UA NEGATIVE NEGATIVE mg/dL   Bilirubin Urine NEGATIVE NEGATIVE   Ketones, ur NEGATIVE NEGATIVE mg/dL   Specific Gravity, Urine >=1.030 1.005 - 1.030   Hgb urine dipstick TRACE (A) NEGATIVE   pH 6.5 5.0 - 8.0   Protein, ur NEGATIVE NEGATIVE mg/dL   Urobilinogen, UA 0.2 0.0 - 1.0 mg/dL   Nitrite NEGATIVE NEGATIVE   Leukocytes,Ua NEGATIVE NEGATIVE    Assessment and Plan :   PDMP not reviewed this encounter.  1. Acute pharyngitis, unspecified etiology   2. Fever, unspecified   3. Throat pain   4. Painful swallowing   5. Penile discharge   6. History of prostatitis     Will cover for pharyngitis with  amoxicillin twice daily x10 days. Labs pending. Recommended follow up with his urologist, urine culture pending. Counseled patient on potential for adverse effects with medications prescribed/recommended today, ER and return-to-clinic precautions discussed, patient verbalized understanding.    Wallis Bamberg, [place] 10/02/19 1432

## 2019-10-02 NOTE — Discharge Instructions (Addendum)

## 2019-10-03 LAB — CYTOLOGY, (ORAL, ANAL, URETHRAL) ANCILLARY ONLY
Chlamydia: NEGATIVE
Comment: NEGATIVE
Comment: NEGATIVE
Comment: NORMAL
Neisseria Gonorrhea: NEGATIVE
Trichomonas: NEGATIVE

## 2019-10-03 LAB — URINE CULTURE: Culture: NO GROWTH

## 2019-10-15 ENCOUNTER — Other Ambulatory Visit: Payer: Self-pay

## 2019-10-15 ENCOUNTER — Emergency Department (HOSPITAL_COMMUNITY)
Admission: EM | Admit: 2019-10-15 | Discharge: 2019-10-15 | Disposition: A | Discharge status: Home / Self Care | Payer: BC Managed Care – PPO | Attending: Emergency Medicine | Admitting: Emergency Medicine

## 2019-10-15 ENCOUNTER — Emergency Department (HOSPITAL_COMMUNITY): Payer: BC Managed Care – PPO

## 2019-10-15 DIAGNOSIS — U071 COVID-19: Secondary | ICD-10-CM | POA: Diagnosis not present

## 2019-10-15 DIAGNOSIS — R05 Cough: Secondary | ICD-10-CM | POA: Diagnosis present

## 2019-10-15 DIAGNOSIS — Z87891 Personal history of nicotine dependence: Secondary | ICD-10-CM | POA: Diagnosis not present

## 2019-10-15 DIAGNOSIS — Z79899 Other long term (current) drug therapy: Secondary | ICD-10-CM | POA: Diagnosis not present

## 2019-10-15 LAB — SARS CORONAVIRUS 2 BY RT PCR (HOSPITAL ORDER, PERFORMED IN ~~LOC~~ HOSPITAL LAB): SARS Coronavirus 2: POSITIVE — AB

## 2019-10-15 MED ORDER — ONDANSETRON 4 MG PO TBDP: 4.0000 mg | ORAL_TABLET | Freq: Three times a day (TID) | ORAL | 0 refills | Status: DC | PRN

## 2019-10-15 MED ORDER — IBUPROFEN 800 MG PO TABS
800.0000 mg | ORAL_TABLET | Freq: Once | ORAL | Status: AC
  Administered 2019-10-15: 800 mg via ORAL
  Filled 2019-10-15: qty 1

## 2019-10-15 MED ORDER — ALBUTEROL SULFATE HFA 108 (90 BASE) MCG/ACT IN AERS
2.0000 | INHALATION_SPRAY | Freq: Once | RESPIRATORY_TRACT | Status: AC
  Administered 2019-10-15: 2 via RESPIRATORY_TRACT
  Filled 2019-10-15: qty 6.7

## 2019-10-15 MED ORDER — ONDANSETRON 4 MG PO TBDP
4.0000 mg | ORAL_TABLET | Freq: Once | ORAL | Status: AC
  Administered 2019-10-15: 4 mg via ORAL
  Filled 2019-10-15: qty 1

## 2019-10-15 NOTE — ED Provider Notes (Signed)
[facility] EMERGENCY DEPARTMENT Provider Note   CSN: [id number] Arrival date & time: 10/15/19  7628     History Chief Complaint  Patient presents with  . Shortness of Breath    Danny Mccoy is a 34 y.o. male presenting for evaluation of cough, shortness of breath, headache, fever.  Patient states his symptoms began 2 days ago.  He reports nonproductive cough.  Cough is worse when he takes deep breath in.  Yesterday he had a fever up to 102.  He reports he is short of breath, worse with ambulation.  He also reports he is having loose stools for the past week.  He is associated nasal congestion headache.  He reports generalized body aches and feels cold.  He has taken NyQuil for symptoms, has not taken anything else.  He denies history of lung problems including including asthma or COPD.  He has never needed an inhaler.  He denies sick contacts.  He has not received his Covid vaccine.  He reports no medical problems, takes medications daily.  He denies nausea, vomiting, abdpain, chest pain.  He has no loss of smell, states his taste is "different."  He reports loss of appetite, but is able to tolerate p.o.   HPI     No past medical history on file.  Patient Active Problem List   Diagnosis Date Noted  . CONSTIPATION 06/30/2007    No past surgical history on file.     Family History  Problem Relation Age of Onset  . Healthy Mother   . Healthy Father     Social History   Tobacco Use  . Smoking status: Former Smoker    Types: Cigarettes, Cigars    Quit date: 01/10/2019    Years since quitting: 0.7  . Smokeless tobacco: Never Used  . Tobacco comment: black and mild 2x/week  Vaping Use  . Vaping Use: Never used  Substance Use Topics  . Alcohol use: Yes    Comment: occ  . Drug use: No    Home Medications Prior to Admission medications   Medication Sig Start Date End Date Taking? Authorizing Provider  acetaminophen (TYLENOL) 325 MG tablet Take 2  tablets (650 mg total) by mouth every 6 (six) hours as needed. 08/17/19   Darr, Veryl Speak, PA-C  amoxicillin (AMOXIL) 500 MG capsule Take 1 capsule (500 mg total) by mouth 2 (two) times daily. 10/02/19   Wallis Bamberg, PA-C  ibuprofen (ADVIL) 400 MG tablet Take 1 tablet (400 mg total) by mouth every 6 (six) hours as needed. 08/17/19   Darr, Veryl Speak, PA-C  naproxen (NAPROSYN) 500 MG tablet Take 1 tablet (500 mg total) by mouth 2 (two) times daily with a meal. 10/02/19   Wallis Bamberg, PA-C  ondansetron (ZOFRAN ODT) 4 MG disintegrating tablet Take 1 tablet (4 mg total) by mouth every 8 (eight) hours as needed for nausea or vomiting. 10/15/19   Siyana Erney, PA-C  fluticasone (FLONASE) 50 MCG/ACT nasal spray Place 1 spray into both nostrils daily. Patient not taking: Reported on 08/10/2018 04/12/17 08/10/18  Georgetta Haber, NP    Allergies    Patient has no known allergies.  Review of Systems   Review of Systems  Constitutional: Positive for fever.  Respiratory: Positive for cough and shortness of breath.   Musculoskeletal: Positive for myalgias.  Neurological: Positive for headaches.  All other systems reviewed and are negative.   Physical Exam Updated Vital Signs BP 118/85 (BP Location: Right Arm)  Pulse 82   Temp 98.7 F (37.1 C) (Oral)   Resp 20   Ht 6' (1.829 m)   Wt 90.7 kg   SpO2 99%   BMI 27.12 kg/m   Physical Exam Vitals and nursing note reviewed.  Constitutional:      General: He is not in acute distress.    Appearance: He is well-developed.     Comments: Laying in the bed in no acute distress  HENT:     Head: Normocephalic and atraumatic.     Right Ear: Tympanic membrane, ear canal and external ear normal.     Left Ear: Tympanic membrane, ear canal and external ear normal.     Nose: Mucosal edema present.     Right Sinus: No maxillary sinus tenderness or frontal sinus tenderness.     Left Sinus: No maxillary sinus tenderness or frontal sinus tenderness.     Mouth/Throat:      Pharynx: Uvula midline.     Tonsils: No tonsillar exudate.  Eyes:     Conjunctiva/sclera: Conjunctivae normal.     Pupils: Pupils are equal, round, and reactive to light.  Cardiovascular:     Rate and Rhythm: Normal rate and regular rhythm.     Pulses: Normal pulses.  Pulmonary:     Effort: Pulmonary effort is normal.     Breath sounds: Normal breath sounds. No decreased breath sounds, wheezing, rhonchi or rales.     Comments: Speaking full sentences.  Clear lung sounds in all fields.  When patient takes a deep breath in, does cause cough.  Sats stable on room air. Abdominal:     General: There is no distension.     Palpations: Abdomen is soft. There is no mass.     Tenderness: There is no abdominal tenderness. There is no guarding or rebound.  Musculoskeletal:        General: Normal range of motion.     Cervical back: Normal range of motion.     Right lower leg: No edema.     Left lower leg: No edema.  Lymphadenopathy:     Cervical: No cervical adenopathy.  Skin:    General: Skin is warm.     Capillary Refill: Capillary refill takes less than 2 seconds.  Neurological:     Mental Status: He is alert and oriented to person, place, and time.     ED Results / Procedures / Treatments   Labs (all labs ordered are listed, but only abnormal results are displayed) Labs Reviewed  SARS CORONAVIRUS 2 (HOSPITAL ORDER, PERFORMED IN [facility]) - Abnormal; Notable for the following components:      Result Value   SARS Coronavirus 2 POSITIVE (*)    All other components within normal limits    EKG EKG Interpretation  Date/Time:  Sunday October 15 2019 02:49:34 EDT Ventricular Rate:  95 PR Interval:  168 QRS Duration: 96 QT Interval:  322 QTC Calculation: 404 R Axis:   -90 Text Interpretation: Normal sinus rhythm Left anterior fascicular block Abnormal ECG No old tracing to compare Confirmed by Drema Pry [phone]) on 10/15/2019 4:59:27 AM   Radiology DG Chest  2 View  Result Date: 10/15/2019 CLINICAL DATA:  Shortness of breath and fever EXAM: CHEST - 2 VIEW COMPARISON:  None. FINDINGS: The heart size and mediastinal contours are within normal limits. Both lungs are clear. The visualized skeletal structures are unremarkable. IMPRESSION: No active cardiopulmonary disease. Electronically Signed   By: Chrisandra Netters.D.  On: 10/15/2019 03:30    Procedures Procedures (including critical care time)  Medications Ordered in ED Medications  albuterol (VENTOLIN HFA) 108 (90 Base) MCG/ACT inhaler 2 puff (2 puffs Inhalation Given 10/15/19 1321)  ondansetron (ZOFRAN-ODT) disintegrating tablet 4 mg (4 mg Oral Given 10/15/19 1321)  ibuprofen (ADVIL) tablet 800 mg (800 mg Oral Given 10/15/19 1321)    ED Course  I have reviewed the triage vital signs and the nursing notes.  Pertinent labs & imaging results that were available during my care of the patient were reviewed by me and considered in my medical decision making (see chart for details).    MDM Rules/Calculators/A&P                          Patient presenting for evaluation of URI symptoms.  On exam, patient peers nontoxic.  Pulmonary exam is overall reassuring.  Likely viral illness such as Covid.  Chest x-ray obtained in triage interpreted by me, no pneumonia pneumothorax, effusion, cardiomegaly.  EKG nonischemic.  I do not believe patient needs emergent lab work at this time.  Will treat symptomatically with albuterol, Zofran, ibuprofen.  Covid test is pending.  On reassessment after symptomatic treatment, patient reports mild improvement of symptoms.  Covid positive, likely the cause of his symptoms.  Discussed with patient.  Discussed continued symptomatic treatment at home.  At this time, patient appears safe for discharge.  Return precautions given.  Patient states he understands and agrees to plan.  Final Clinical Impression(s) / ED Diagnoses Final diagnoses:  COVID-19    Rx / DC Orders ED  Discharge Orders         Ordered    ondansetron (ZOFRAN ODT) 4 MG disintegrating tablet  Every 8 hours PRN        10/15/19 1353           Auston Halfmann, PA-C 10/15/19 1433    Arby Barrette, MD 10/15/19 1545

## 2019-10-15 NOTE — ED Triage Notes (Signed)
Pt has been having sob, chills, cough and headache 2 days. Pt has no chest pain

## 2019-10-15 NOTE — Discharge Instructions (Signed)
Your Covid test came back positive.  This means you will need to isolate at home for a total of 10 days from symptom onset.  You may end isolation if you are fever free for 24 hours without medicine and your symptoms are not worsening. Covid is a virus, this should be treated symptomatically.  Use Tylenol or ibuprofen as needed for headache, body aches, fever. Use Zofran as needed for nausea or vomiting. Make sure stay well-hydrated water. Use albuterol as needed for coughing fit, wheezing, shortness of breath. You may use cough drops or cough syrup as needed. Monitor your breathing.  If you have significant difficulty breathing, especially at rest, return to the emergency room for reevaluation.

## 2019-10-15 NOTE — ED Notes (Signed)
This RN went over detailed d/c instructions with pt. Pt verbalized understanding of covid symptoms management and isolation. Pt provided wheelchair and escorted to lobby

## 2019-10-15 NOTE — ED Notes (Signed)
Encouraged pt to drink fluids. Pt tired and curled up in blankets at this time

## 2019-10-16 ENCOUNTER — Other Ambulatory Visit: Payer: Self-pay | Admitting: Physician Assistant

## 2019-10-16 DIAGNOSIS — U071 COVID-19: Secondary | ICD-10-CM

## 2019-10-16 DIAGNOSIS — E663 Overweight: Secondary | ICD-10-CM

## 2019-10-16 NOTE — Progress Notes (Signed)
I connected by phone with Danny Mccoy on 10/16/2019 at 2:51 PM to discuss the potential use of a new treatment for mild to moderate COVID-19 viral infection in non-hospitalized patients.  This patient is a 34 y.o. male that meets the FDA criteria for Emergency Use Authorization of COVID monoclonal antibody casirivimab/imdevimab.  Has a (+) direct SARS-CoV-2 viral test result  Has mild or moderate COVID-19   Is NOT hospitalized due to COVID-19  Is within 10 days of symptom onset  Has at least one of the high risk factor(s) for progression to severe COVID-19 and/or hospitalization as defined in EUA.  Specific high risk criteria : BMI > 25   I have spoken and communicated the following to the patient or parent/caregiver regarding COVID monoclonal antibody treatment:  1. FDA has authorized the emergency use for the treatment of mild to moderate COVID-19 in adults and pediatric patients with positive results of direct SARS-CoV-2 viral testing who are 33 years of age and older weighing at least 40 kg, and who are at high risk for progressing to severe COVID-19 and/or hospitalization.  2. The significant known and potential risks and benefits of COVID monoclonal antibody, and the extent to which such potential risks and benefits are unknown.  3. Information on available alternative treatments and the risks and benefits of those alternatives, including clinical trials.  4. Patients treated with COVID monoclonal antibody should continue to self-isolate and use infection control measures (e.g., wear mask, isolate, social distance, avoid sharing personal items, clean and disinfect high touch surfaces, and frequent handwashing) according to CDC guidelines.   5. The patient or parent/caregiver has the option to accept or refuse COVID monoclonal antibody treatment.  After reviewing this information with the patient, The patient agreed to proceed with receiving casirivimab\imdevimab infusion and  will be provided a copy of the Fact sheet prior to receiving the infusion.    [name] 10/16/2019 2:51 PM

## 2019-10-17 ENCOUNTER — Ambulatory Visit (HOSPITAL_COMMUNITY)
Admission: RE | Admit: 2019-10-17 | Discharge: 2019-10-17 | Disposition: A | Discharge status: Home / Self Care | Payer: BC Managed Care – PPO | Source: Ambulatory Visit | Attending: Pulmonary Disease | Admitting: Pulmonary Disease

## 2019-10-17 ENCOUNTER — Other Ambulatory Visit: Payer: Self-pay | Admitting: Nurse Practitioner

## 2019-10-17 DIAGNOSIS — U071 COVID-19: Secondary | ICD-10-CM

## 2019-10-17 DIAGNOSIS — R110 Nausea: Secondary | ICD-10-CM

## 2019-10-17 MED ORDER — SODIUM CHLORIDE 0.9 % IV SOLN
1200.0000 mg | Freq: Once | INTRAVENOUS | Status: AC
  Administered 2019-10-17: 1200 mg via INTRAVENOUS
  Filled 2019-10-17: qty 10

## 2019-10-17 MED ORDER — METHYLPREDNISOLONE SODIUM SUCC 125 MG IJ SOLR: 125.0000 mg | Freq: Once | INTRAMUSCULAR | Status: DC | PRN

## 2019-10-17 MED ORDER — ALBUTEROL SULFATE HFA 108 (90 BASE) MCG/ACT IN AERS: 2.0000 | INHALATION_SPRAY | Freq: Once | RESPIRATORY_TRACT | Status: DC | PRN

## 2019-10-17 MED ORDER — EPINEPHRINE 0.3 MG/0.3ML IJ SOAJ: 0.3000 mg | Freq: Once | INTRAMUSCULAR | Status: DC | PRN

## 2019-10-17 MED ORDER — ONDANSETRON 4 MG PO TBDP
8.0000 mg | ORAL_TABLET | Freq: Once | ORAL | Status: DC
  Filled 2019-10-17: qty 2

## 2019-10-17 MED ORDER — FAMOTIDINE IN NACL 20-0.9 MG/50ML-% IV SOLN: 20.0000 mg | Freq: Once | INTRAVENOUS | Status: DC | PRN

## 2019-10-17 MED ORDER — DIPHENHYDRAMINE HCL 50 MG/ML IJ SOLN: 50.0000 mg | Freq: Once | INTRAMUSCULAR | Status: DC | PRN

## 2019-10-17 MED ORDER — SODIUM CHLORIDE 0.9 % IV SOLN: INTRAVENOUS | Status: DC | PRN

## 2019-10-17 NOTE — Progress Notes (Signed)
  Diagnosis: COVID-19  Physician:Dr Wright  Procedure: Covid Infusion Clinic Med: casirivimab\imdevimab infusion - Provided patient with casirivimab\imdevimab fact sheet for patients, parents and caregivers prior to infusion.  Complications: No immediate complications noted.  Discharge: Discharged home   Danny Mccoy 10/17/2019  

## 2019-10-17 NOTE — Discharge Instructions (Signed)
10 Things You Can Do to Manage Your COVID-19 Symptoms at Home If you have possible or confirmed COVID-19: 1. Stay home from work and school. And stay away from other public places. If you must go out, avoid using any kind of public transportation, ridesharing, or taxis. 2. Monitor your symptoms carefully. If your symptoms get worse, call your healthcare provider immediately. 3. Get rest and stay hydrated. 4. If you have a medical appointment, call the healthcare provider ahead of time and tell them that you have or may have COVID-19. 5. For medical emergencies, call 911 and notify the dispatch personnel that you have or may have COVID-19. 6. Cover your cough and sneezes with a tissue or use the inside of your elbow. 7. Wash your hands often with soap and water for at least 20 seconds or clean your hands with an alcohol-based hand sanitizer that contains at least 60% alcohol. 8. As much as possible, stay in a specific room and away from other people in your home. Also, you should use a separate bathroom, if available. If you need to be around other people in or outside of the home, wear a mask. 9. Avoid sharing personal items with other people in your household, like dishes, towels, and bedding. 10. Clean all surfaces that are touched often, like counters, tabletops, and doorknobs. Use household cleaning sprays or wipes according to the label instructions. cdc.gov/coronavirus 08/10/2018 This information is not intended to replace advice given to you by your health care provider. Make sure you discuss any questions you have with your health care provider. Document Revised: 01/12/2019 Document Reviewed: 01/12/2019 Elsevier Patient Education  2020 Elsevier Inc. What types of side effects do monoclonal antibody drugs cause?  Common side effects  In general, the more common side effects caused by monoclonal antibody drugs include: . Allergic reactions, such as hives or itching . Flu-like signs and  symptoms, including chills, fatigue, fever, and muscle aches and pains . Nausea, vomiting . Diarrhea + . Skin rashes . Low blood pressure   The CDC is recommending patients who receive monoclonal antibody treatments wait at least 90 days before being vaccinated.  Currently, there are no data on the safety and efficacy of mRNA COVID-19 vaccines in persons who received monoclonal antibodies or convalescent plasma as part of COVID-19 treatment. Based on the estimated half-life of such therapies as well as evidence suggesting that reinfection is uncommon in the 90 days after initial infection, vaccination should be deferred for at least 90 days, as a precautionary measure until additional information becomes available, to avoid interference of the antibody treatment with vaccine-induced immune responses.  10 Things You Can Do to Manage Your COVID-19 Symptoms at Home If you have possible or confirmed COVID-19: 11. Stay home from work and school. And stay away from other public places. If you must go out, avoid using any kind of public transportation, ridesharing, or taxis. 12. Monitor your symptoms carefully. If your symptoms get worse, call your healthcare provider immediately. 13. Get rest and stay hydrated. 14. If you have a medical appointment, call the healthcare provider ahead of time and tell them that you have or may have COVID-19. 15. For medical emergencies, call 911 and notify the dispatch personnel that you have or may have COVID-19. 16. Cover your cough and sneezes with a tissue or use the inside of your elbow. 17. Wash your hands often with soap and water for at least 20 seconds or clean your hands with an alcohol-based hand   sanitizer that contains at least 60% alcohol. 18. As much as possible, stay in a specific room and away from other people in your home. Also, you should use a separate bathroom, if available. If you need to be around other people in or outside of the home, wear a  mask. 19. Avoid sharing personal items with other people in your household, like dishes, towels, and bedding. 20. Clean all surfaces that are touched often, like counters, tabletops, and doorknobs. Use household cleaning sprays or wipes according to the label instructions. cdc.gov/coronavirus 08/10/2018 This information is not intended to replace advice given to you by your health care provider. Make sure you discuss any questions you have with your health care provider. Document Revised: 01/12/2019 Document Reviewed: 01/12/2019 Elsevier Patient Education  2020 Elsevier Inc.  

## 2019-12-06 ENCOUNTER — Encounter (HOSPITAL_COMMUNITY): Payer: Self-pay | Admitting: Emergency Medicine

## 2019-12-06 ENCOUNTER — Other Ambulatory Visit: Payer: Self-pay

## 2019-12-06 ENCOUNTER — Other Ambulatory Visit: Payer: Self-pay | Admitting: Family Medicine

## 2019-12-06 ENCOUNTER — Ambulatory Visit (HOSPITAL_COMMUNITY)
Admission: EM | Admit: 2019-12-06 | Discharge: 2019-12-06 | Disposition: A | Discharge status: Home / Self Care | Payer: BC Managed Care – PPO | Attending: Family Medicine | Admitting: Family Medicine

## 2019-12-06 DIAGNOSIS — Z202 Contact with and (suspected) exposure to infections with a predominantly sexual mode of transmission: Secondary | ICD-10-CM

## 2019-12-06 DIAGNOSIS — R369 Urethral discharge, unspecified: Secondary | ICD-10-CM | POA: Insufficient documentation

## 2019-12-06 LAB — POCT URINALYSIS DIPSTICK, ED / UC
Glucose, UA: NEGATIVE mg/dL
Ketones, ur: NEGATIVE mg/dL
Leukocytes,Ua: NEGATIVE
Nitrite: NEGATIVE
Protein, ur: NEGATIVE mg/dL
Specific Gravity, Urine: >=1.03 (ref 1.005–1.030)
Urobilinogen, UA: 0.2 mg/dL (ref 0.0–1.0)
pH: 5.5 (ref 5.0–8.0)

## 2019-12-06 MED ORDER — CEFTRIAXONE SODIUM 500 MG IJ SOLR
INTRAMUSCULAR | Status: AC
  Filled 2019-12-06: qty 500

## 2019-12-06 MED ORDER — AZITHROMYCIN 250 MG PO TABS
ORAL_TABLET | ORAL | Status: AC
  Filled 2019-12-06: qty 4

## 2019-12-06 MED ORDER — CEFTRIAXONE SODIUM 500 MG IJ SOLR
500.0000 mg | Freq: Once | INTRAMUSCULAR | Status: AC
  Administered 2019-12-06: 500 mg via INTRAMUSCULAR

## 2019-12-06 MED ORDER — LIDOCAINE HCL (PF) 1 % IJ SOLN
INTRAMUSCULAR | Status: AC
  Filled 2019-12-06: qty 2

## 2019-12-06 MED ORDER — AZITHROMYCIN 250 MG PO TABS
1000.0000 mg | ORAL_TABLET | Freq: Once | ORAL | Status: AC
  Administered 2019-12-06: 1000 mg via ORAL

## 2019-12-06 NOTE — Discharge Instructions (Addendum)
Treated you for gonorrhea and chlamydia today. Swab sent for testing Follow up as needed for continued or worsening symptoms

## 2019-12-06 NOTE — ED Triage Notes (Signed)
Pt c/o penile discharge and burning with urination x 4 days. He states he also has had frequent urination.

## 2019-12-06 NOTE — ED Provider Notes (Signed)
[facility]    CSN: [id number] Arrival date & time: 12/06/19  1540      History   Chief Complaint Chief Complaint  Patient presents with  . Exposure to STD    HPI Tyran Huser is a 34 y.o. male.   Patient is a 34 year old male presents today for possible exposure to STD.  Reporting for the past 3 days post having sexual intercourse he has had dysuria and penile leakage.  History of similar in the past with multiple screenings for STDs.  Has been positive for gonorrhea in the past.  No other concerns     History reviewed. No pertinent past medical history.  Patient Active Problem List   Diagnosis Date Noted  . CONSTIPATION 06/30/2007    History reviewed. No pertinent surgical history.     Home Medications    Prior to Admission medications   Medication Sig Start Date End Date Taking? Authorizing Provider  acetaminophen (TYLENOL) 325 MG tablet Take 2 tablets (650 mg total) by mouth every 6 (six) hours as needed. 08/17/19   Darr, Veryl Speak, PA-C  amoxicillin (AMOXIL) 500 MG capsule Take 1 capsule (500 mg total) by mouth 2 (two) times daily. 10/02/19   Wallis Bamberg, PA-C  ibuprofen (ADVIL) 400 MG tablet Take 1 tablet (400 mg total) by mouth every 6 (six) hours as needed. 08/17/19   Darr, Veryl Speak, PA-C  naproxen (NAPROSYN) 500 MG tablet Take 1 tablet (500 mg total) by mouth 2 (two) times daily with a meal. 10/02/19   Wallis Bamberg, PA-C  ondansetron (ZOFRAN ODT) 4 MG disintegrating tablet Take 1 tablet (4 mg total) by mouth every 8 (eight) hours as needed for nausea or vomiting. 10/15/19   Caccavale, Sophia, PA-C  fluticasone (FLONASE) 50 MCG/ACT nasal spray Place 1 spray into both nostrils daily. Patient not taking: Reported on 08/10/2018 04/12/17 08/10/18  Georgetta Haber, NP    Family History Family History  Problem Relation Age of Onset  . Healthy Mother   . Healthy Father     Social History Social History   Tobacco Use  . Smoking status: Former Smoker     Types: Cigarettes, Cigars    Quit date: 01/10/2019    Years since quitting: 0.9  . Smokeless tobacco: Never Used  . Tobacco comment: black and mild 2x/week  Vaping Use  . Vaping Use: Never used  Substance Use Topics  . Alcohol use: Yes    Comment: occ  . Drug use: No     Allergies   Patient has no known allergies.   Review of Systems Review of Systems   Physical Exam Triage Vital Signs ED Triage Vitals  Enc Vitals Group     BP 12/06/19 0841 111/79     Pulse Rate 12/06/19 0841 79     Resp 12/06/19 0841 19     Temp 12/06/19 0841 97.7 F (36.5 C)     Temp Source 12/06/19 0841 Oral     SpO2 12/06/19 0841 98 %     Weight --      Height --      Head Circumference --      Peak Flow --      Pain Score 12/06/19 0839 0     Pain Loc --      Pain Edu? --      Excl. in GC? --    No data found.  Updated Vital Signs BP 111/79   Pulse 79   Temp 97.7 F (  36.5 C) (Oral)   Resp 19   SpO2 98%   Visual Acuity Right Eye Distance:   Left Eye Distance:   Bilateral Distance:    Right Eye Near:   Left Eye Near:    Bilateral Near:     Physical Exam Vitals and nursing note reviewed.  Constitutional:      Appearance: Normal appearance.  HENT:     Head: Normocephalic and atraumatic.  Eyes:     Conjunctiva/sclera: Conjunctivae normal.  Pulmonary:     Effort: Pulmonary effort is normal.  Musculoskeletal:        General: Normal range of motion.     Cervical back: Normal range of motion.  Skin:    General: Skin is warm and dry.  Neurological:     Mental Status: He is alert.  Psychiatric:        Mood and Affect: Mood normal.      UC Treatments / Results  Labs (all labs ordered are listed, but only abnormal results are displayed) Labs Reviewed  POCT URINALYSIS DIPSTICK, ED / UC - Abnormal; Notable for the following components:      Result Value   Bilirubin Urine SMALL (*)    Hgb urine dipstick TRACE (*)    All other components within normal limits  CYTOLOGY,  (ORAL, ANAL, URETHRAL) ANCILLARY ONLY    EKG   Radiology No results found.  Procedures Procedures (including critical care time)  Medications Ordered in UC Medications  cefTRIAXone (ROCEPHIN) injection 500 mg (500 mg Intramuscular Given 12/06/19 0945)  azithromycin (ZITHROMAX) tablet 1,000 mg (1,000 mg Oral Given 12/06/19 0944)    Initial Impression / Assessment and Plan / UC Course  I have reviewed the triage vital signs and the nursing notes.  Pertinent labs & imaging results that were available during my care of the patient were reviewed by me and considered in my medical decision making (see chart for details).     Penile discharge Treating prophylactically for gonorrhea and chlamydia today.  Swab sent for testing.  Urine without infection here today. Final Clinical Impressions(s) / UC Diagnoses   Final diagnoses:  Penile discharge     Discharge Instructions     Treated you for gonorrhea and chlamydia today. Swab sent for testing Follow up as needed for continued or worsening symptoms     ED Prescriptions    None     PDMP not reviewed this encounter.   Janace Aris, NP 12/06/19 [id number]

## 2019-12-07 LAB — MOLECULAR ANCILLARY ONLY
Chlamydia: NEGATIVE
Comment: NEGATIVE
Comment: NEGATIVE
Comment: NORMAL
Neisseria Gonorrhea: NEGATIVE
Trichomonas: NEGATIVE

## 2020-01-01 ENCOUNTER — Ambulatory Visit (HOSPITAL_COMMUNITY)
Admission: EM | Admit: 2020-01-01 | Discharge: 2020-01-01 | Disposition: A | Discharge status: Home / Self Care | Payer: BC Managed Care – PPO | Attending: Emergency Medicine | Admitting: Emergency Medicine

## 2020-01-01 ENCOUNTER — Other Ambulatory Visit: Payer: Self-pay

## 2020-01-01 ENCOUNTER — Encounter (HOSPITAL_COMMUNITY): Payer: Self-pay

## 2020-01-01 DIAGNOSIS — B349 Viral infection, unspecified: Secondary | ICD-10-CM | POA: Diagnosis not present

## 2020-01-01 DIAGNOSIS — Z87891 Personal history of nicotine dependence: Secondary | ICD-10-CM | POA: Diagnosis not present

## 2020-01-01 DIAGNOSIS — R5383 Other fatigue: Secondary | ICD-10-CM | POA: Diagnosis not present

## 2020-01-01 DIAGNOSIS — Z20822 Contact with and (suspected) exposure to covid-19: Secondary | ICD-10-CM | POA: Insufficient documentation

## 2020-01-01 MED ORDER — PREDNISONE 10 MG (21) PO TBPK: ORAL_TABLET | Freq: Every day | ORAL | 0 refills | Status: DC

## 2020-01-01 NOTE — Discharge Instructions (Signed)
Stay hydrated well  This could be something viral unsure will send off covid test to ensure this is not a cause  Check my chart in 3 days for results

## 2020-01-01 NOTE — ED Triage Notes (Signed)
Pt in with c/o sob, headache, and fatigue that has been going on for 1 week.  Has not been taking medication for sxs  Denies cough, runny nose, n/v

## 2020-01-01 NOTE — ED Provider Notes (Signed)
[facility]    CSN: [id number] Arrival date & time: 01/01/20  1653      History   Chief Complaint Chief Complaint  Patient presents with  . Headache  . Fatigue  . Shortness of Breath    HPI Danny Mccoy is a 34 y.o. male.   Pt here c/o body ache, sob, fatigue for 2 days. States that he does not have a fever, no cough, no n/v/d just doesn't feel good. Had covid in sept with infusion. Denies any of these sx. Has not taken anything pta.      History reviewed. No pertinent past medical history.  Patient Active Problem List   Diagnosis Date Noted  . CONSTIPATION 06/30/2007    History reviewed. No pertinent surgical history.     Home Medications    Prior to Admission medications   Medication Sig Start Date End Date Taking? Authorizing Provider  acetaminophen (TYLENOL) 325 MG tablet Take 2 tablets (650 mg total) by mouth every 6 (six) hours as needed. 08/17/19   Darr, Gerilyn Pilgrim, PA-C  amoxicillin (AMOXIL) 500 MG capsule Take 1 capsule (500 mg total) by mouth 2 (two) times daily. 10/02/19   Wallis Bamberg, PA-C  ibuprofen (ADVIL) 400 MG tablet Take 1 tablet (400 mg total) by mouth every 6 (six) hours as needed. 08/17/19   Darr, Gerilyn Pilgrim, PA-C  naproxen (NAPROSYN) 500 MG tablet Take 1 tablet (500 mg total) by mouth 2 (two) times daily with a meal. 10/02/19   Wallis Bamberg, PA-C  ondansetron (ZOFRAN ODT) 4 MG disintegrating tablet Take 1 tablet (4 mg total) by mouth every 8 (eight) hours as needed for nausea or vomiting. 10/15/19   Caccavale, Sophia, PA-C  predniSONE (STERAPRED UNI-PAK 21 TAB) 10 MG (21) TBPK tablet Take by mouth daily. Take 6 tabs by mouth daily  for 2 days, then 5 tabs for 2 days, then 4 tabs for 2 days, then 3 tabs for 2 days, 2 tabs for 2 days, then 1 tab by mouth daily for 2 days 01/01/20   Coralyn Mark, NP  fluticasone (FLONASE) 50 MCG/ACT nasal spray Place 1 spray into both nostrils daily. Patient not taking: Reported on 08/10/2018 04/12/17 08/10/18  Georgetta Haber, NP    Family History Family History  Problem Relation Age of Onset  . Healthy Mother   . Healthy Father     Social History Social History   Tobacco Use  . Smoking status: Former Smoker    Types: Cigarettes, Cigars    Quit date: 01/10/2019    Years since quitting: 0.9  . Smokeless tobacco: Never Used  . Tobacco comment: black and mild 2x/week  Vaping Use  . Vaping Use: Never used  Substance Use Topics  . Alcohol use: Yes    Comment: occ  . Drug use: No     Allergies   Patient has no known allergies.   Review of Systems Review of Systems  Constitutional: Positive for activity change.  HENT: Negative.   Respiratory: Positive for shortness of breath.   Gastrointestinal: Negative.   Neurological:       Body aches with intermit headache      Physical Exam Triage Vital Signs ED Triage Vitals  Enc Vitals Group     BP 01/01/20 1747 123/61     Pulse Rate 01/01/20 1747 70     Resp 01/01/20 1747 20     Temp 01/01/20 1747 98.2 F (36.8 C)     Temp Source 01/01/20 1747  Oral     SpO2 01/01/20 1747 100 %     Weight --      Height --      Head Circumference --      Peak Flow --      Pain Score 01/01/20 1746 5     Pain Loc --      Pain Edu? --      Excl. in GC? --    No data found.  Updated Vital Signs BP 123/61 (BP Location: Right Arm)   Pulse 70   Temp 98.2 F (36.8 C) (Oral)   Resp 20   SpO2 100%   Visual Acuity Right Eye Distance:   Left Eye Distance:   Bilateral Distance:    Right Eye Near:   Left Eye Near:    Bilateral Near:     Physical Exam HENT:     Head: Normocephalic.  Eyes:     Extraocular Movements: Extraocular movements intact.  Cardiovascular:     Rate and Rhythm: Normal rate.  Pulmonary:     Effort: Pulmonary effort is normal.     Breath sounds: Normal breath sounds.  Abdominal:     Palpations: Abdomen is soft.  Musculoskeletal:     Cervical back: Normal range of motion.  Skin:    General: Skin is warm.    Neurological:     Mental Status: He is alert.      UC Treatments / Results  Labs (all labs ordered are listed, but only abnormal results are displayed) Labs Reviewed - No data to display  EKG   Radiology No results found.  Procedures Procedures (including critical care time)  Medications Ordered in UC Medications - No data to display  Initial Impression / Assessment and Plan / UC Course  I have reviewed the triage vital signs and the nursing notes.  Pertinent labs & imaging results that were available during my care of the patient were reviewed by me and considered in my medical decision making (see chart for details).    Stay hydrated well  This could be something viral unsure will send off covid test to ensure this is not a cause  Check my chart in 3 days for results   Final Clinical Impressions(s) / UC Diagnoses   Final diagnoses:  Fatigue, unspecified type  Viral illness     Discharge Instructions     Stay hydrated well  This could be something viral unsure will send off covid test to ensure this is not a cause  Check my chart in 3 days for results     ED Prescriptions    Medication Sig Dispense Auth. Provider   predniSONE (STERAPRED UNI-PAK 21 TAB) 10 MG (21) TBPK tablet Take by mouth daily. Take 6 tabs by mouth daily  for 2 days, then 5 tabs for 2 days, then 4 tabs for 2 days, then 3 tabs for 2 days, 2 tabs for 2 days, then 1 tab by mouth daily for 2 days 42 tablet Coralyn Mark, NP     PDMP not reviewed this encounter.   Coralyn Mark, NP 01/01/20 1806

## 2020-01-02 LAB — SARS CORONAVIRUS 2 (TAT 6-24 HRS): SARS Coronavirus 2: NEGATIVE

## 2020-04-01 ENCOUNTER — Ambulatory Visit (HOSPITAL_COMMUNITY)
Admission: EM | Admit: 2020-04-01 | Discharge: 2020-04-01 | Disposition: A | Discharge status: Home / Self Care | Payer: BC Managed Care – PPO | Attending: Emergency Medicine | Admitting: Emergency Medicine

## 2020-04-01 ENCOUNTER — Encounter (HOSPITAL_COMMUNITY): Payer: Self-pay

## 2020-04-01 ENCOUNTER — Other Ambulatory Visit: Payer: Self-pay

## 2020-04-01 DIAGNOSIS — Z87891 Personal history of nicotine dependence: Secondary | ICD-10-CM | POA: Insufficient documentation

## 2020-04-01 DIAGNOSIS — Z791 Long term (current) use of non-steroidal anti-inflammatories (NSAID): Secondary | ICD-10-CM | POA: Insufficient documentation

## 2020-04-01 DIAGNOSIS — R300 Dysuria: Secondary | ICD-10-CM

## 2020-04-01 LAB — POCT URINALYSIS DIPSTICK, ED / UC
Bilirubin Urine: NEGATIVE
Glucose, UA: NEGATIVE mg/dL
Hgb urine dipstick: NEGATIVE
Leukocytes,Ua: NEGATIVE
Nitrite: NEGATIVE
Protein, ur: NEGATIVE mg/dL
Specific Gravity, Urine: >=1.03 (ref 1.005–1.030)
Urobilinogen, UA: 0.2 mg/dL (ref 0.0–1.0)
pH: 5.5 (ref 5.0–8.0)

## 2020-04-01 NOTE — ED Provider Notes (Signed)
[facility]    CSN: [id number] Arrival date & time: 04/01/20  1508      History   Chief Complaint Chief Complaint  Patient presents with  . Dysuria    HPI Danny Mccoy is a 35 y.o. male.   Patient presents with burning with urination and tingling sensation for 3 days after having oral sex. Associated with frequency of urination and clear discharge. Denies urgency, itching, new lesions, hematuria. Seen frequently over the last 2 years for similar complaints. Last UTI 11/2018, Last STI 08/2018,  History reviewed. No pertinent past medical history.  Patient Active Problem List   Diagnosis Date Noted  . CONSTIPATION 06/30/2007    History reviewed. No pertinent surgical history.     Home Medications    Prior to Admission medications   Medication Sig Start Date End Date Taking? Authorizing Provider  acetaminophen (TYLENOL) 325 MG tablet Take 2 tablets (650 mg total) by mouth every 6 (six) hours as needed. 08/17/19   Darr, Gerilyn Pilgrim, PA-C  amoxicillin (AMOXIL) 500 MG capsule Take 1 capsule (500 mg total) by mouth 2 (two) times daily. 10/02/19   Wallis Bamberg, PA-C  ibuprofen (ADVIL) 400 MG tablet Take 1 tablet (400 mg total) by mouth every 6 (six) hours as needed. 08/17/19   Darr, Gerilyn Pilgrim, PA-C  naproxen (NAPROSYN) 500 MG tablet Take 1 tablet (500 mg total) by mouth 2 (two) times daily with a meal. 10/02/19   Wallis Bamberg, PA-C  ondansetron (ZOFRAN ODT) 4 MG disintegrating tablet Take 1 tablet (4 mg total) by mouth every 8 (eight) hours as needed for nausea or vomiting. 10/15/19   Caccavale, Sophia, PA-C  predniSONE (STERAPRED UNI-PAK 21 TAB) 10 MG (21) TBPK tablet Take by mouth daily. Take 6 tabs by mouth daily  for 2 days, then 5 tabs for 2 days, then 4 tabs for 2 days, then 3 tabs for 2 days, 2 tabs for 2 days, then 1 tab by mouth daily for 2 days 01/01/20   Coralyn Mark, NP  fluticasone (FLONASE) 50 MCG/ACT nasal spray Place 1 spray into both nostrils daily. Patient not  taking: Reported on 08/10/2018 04/12/17 08/10/18  Georgetta Haber, NP    Family History Family History  Problem Relation Age of Onset  . Healthy Mother   . Healthy Father     Social History Social History   Tobacco Use  . Smoking status: Former Smoker    Types: Cigarettes, Cigars    Quit date: 01/10/2019    Years since quitting: 1.2  . Smokeless tobacco: Never Used  . Tobacco comment: black and mild 2x/week  Vaping Use  . Vaping Use: Never used  Substance Use Topics  . Alcohol use: Yes    Comment: occ  . Drug use: No     Allergies   Patient has no known allergies.   Review of Systems Review of Systems  Constitutional: Negative.   HENT: Negative.   Respiratory: Negative.   Gastrointestinal: Negative.   Genitourinary: Positive for dysuria, frequency, penile discharge and testicular pain. Negative for decreased urine volume, difficulty urinating, enuresis, flank pain, genital sores, hematuria, penile pain, penile swelling, scrotal swelling and urgency.  Skin: Negative.   Neurological: Negative.      Physical Exam Triage Vital Signs ED Triage Vitals  Enc Vitals Group     BP 04/01/20 1526 (!) 113/50     Pulse Rate 04/01/20 1526 60     Resp 04/01/20 1526 16     Temp 04/01/20  1526 98.1 F (36.7 C)     Temp Source 04/01/20 1526 Oral     SpO2 04/01/20 1526 98 %     Weight --      Height --      Head Circumference --      Peak Flow --      Pain Score 04/01/20 1524 0     Pain Loc --      Pain Edu? --      Excl. in GC? --    No data found.  Updated Vital Signs BP (!) 113/50 (BP Location: Right Arm)   Pulse 60   Temp 98.1 F (36.7 C) (Oral)   Resp 16   SpO2 98%   Visual Acuity Right Eye Distance:   Left Eye Distance:   Bilateral Distance:    Right Eye Near:   Left Eye Near:    Bilateral Near:     Physical Exam Constitutional:      Appearance: Normal appearance. He is normal weight.  HENT:     Head: Normocephalic.  Eyes:     Extraocular Movements:  Extraocular movements intact.  Pulmonary:     Effort: Pulmonary effort is normal.  Skin:    General: Skin is warm and dry.  Neurological:     Mental Status: He is alert and oriented to person, place, and time. Mental status is at baseline.  Psychiatric:        Mood and Affect: Mood normal.        Behavior: Behavior normal.        Thought Content: Thought content normal.        Judgment: Judgment normal.      UC Treatments / Results  Labs (all labs ordered are listed, but only abnormal results are displayed) Labs Reviewed  POCT URINALYSIS DIPSTICK, ED / UC - Abnormal; Notable for the following components:      Result Value   Ketones, ur TRACE (*)    All other components within normal limits  CYTOLOGY, (ORAL, ANAL, URETHRAL) ANCILLARY ONLY    EKG   Radiology No results found.  Procedures Procedures (including critical care time)  Medications Ordered in UC Medications - No data to display  Initial Impression / Assessment and Plan / UC Course  I have reviewed the triage vital signs and the nursing notes.  Pertinent labs & imaging results that were available during my care of the patient were reviewed by me and considered in my medical decision making (see chart for details).  Dysuria  1. Urinalysis- negative 2. Cytology swab for STI collected, advised abstinence from sex until lab result, will treat per protocol  3. Advised follow up with urology for persistent and reoccurring symptoms   Final Clinical Impressions(s) / UC Diagnoses   Final diagnoses:  Dysuria     Discharge Instructions     Lab results in 2-3 days, will be called if results positive for treatment  refrain from sex until results negative, if positive refrain for sex until treatment completed  Follow up with urology for persistent symptoms   ED Prescriptions    None     PDMP not reviewed this encounter.   Valinda Hoar, NP 04/01/20 1700

## 2020-04-01 NOTE — Discharge Instructions (Addendum)
Lab results in 2-3 days, will be called if results positive for treatment  refrain from sex until results negative, if positive refrain for sex until treatment completed  Follow up with urology for persistent symptoms

## 2020-04-01 NOTE — ED Triage Notes (Signed)
Pt reports burning when urinating x 3 days. He think he has an UTI.   Requested STD's testing.

## 2020-04-03 LAB — CYTOLOGY, (ORAL, ANAL, URETHRAL) ANCILLARY ONLY
Chlamydia: NEGATIVE
Comment: NEGATIVE
Comment: NEGATIVE
Comment: NORMAL
Neisseria Gonorrhea: NEGATIVE
Trichomonas: POSITIVE — AB

## 2020-04-04 ENCOUNTER — Telehealth (HOSPITAL_COMMUNITY): Payer: Self-pay | Admitting: Emergency Medicine

## 2020-04-04 MED ORDER — METRONIDAZOLE 500 MG PO TABS: 500.0000 mg | ORAL_TABLET | Freq: Two times a day (BID) | ORAL | 0 refills | Status: DC

## 2020-04-10 ENCOUNTER — Telehealth (HOSPITAL_COMMUNITY): Payer: Self-pay | Admitting: Emergency Medicine

## 2020-04-10 NOTE — Telephone Encounter (Signed)
Patient called after receiving letter in mail.   Verified identity using two identifiers.  Provided positive result.  Reviewed safe sex practices, notifying partners, and refraining from sexual activities for 7 days from time of treatment.  Patient verified understanding, all questions answered.  Verified pharmacy.

## 2020-05-06 ENCOUNTER — Ambulatory Visit (HOSPITAL_COMMUNITY)
Admission: EM | Admit: 2020-05-06 | Discharge: 2020-05-06 | Disposition: A | Discharge status: Home / Self Care | Payer: Self-pay | Attending: Urgent Care | Admitting: Urgent Care

## 2020-05-06 ENCOUNTER — Other Ambulatory Visit: Payer: Self-pay

## 2020-05-06 DIAGNOSIS — Z8619 Personal history of other infectious and parasitic diseases: Secondary | ICD-10-CM | POA: Insufficient documentation

## 2020-05-06 DIAGNOSIS — R1084 Generalized abdominal pain: Secondary | ICD-10-CM | POA: Insufficient documentation

## 2020-05-06 DIAGNOSIS — R197 Diarrhea, unspecified: Secondary | ICD-10-CM | POA: Insufficient documentation

## 2020-05-06 DIAGNOSIS — R369 Urethral discharge, unspecified: Secondary | ICD-10-CM | POA: Insufficient documentation

## 2020-05-06 DIAGNOSIS — K529 Noninfective gastroenteritis and colitis, unspecified: Secondary | ICD-10-CM | POA: Insufficient documentation

## 2020-05-06 MED ORDER — LOPERAMIDE HCL 2 MG PO CAPS: 2.0000 mg | ORAL_CAPSULE | Freq: Two times a day (BID) | ORAL | 0 refills | Status: DC | PRN

## 2020-05-06 MED ORDER — ONDANSETRON 8 MG PO TBDP: 8.0000 mg | ORAL_TABLET | Freq: Three times a day (TID) | ORAL | 0 refills | Status: DC | PRN

## 2020-05-06 NOTE — ED Provider Notes (Signed)
[facility]   MRN: [id number] DOB: [birth year]  Subjective:   Danny Mccoy is a 35 y.o. male presenting for 3 day history of acute onset general belly pain, diarrhea (4-5 episodes a day), shortness of breath. Reports he took Flagyl for trichomonas for 1 week, finished the course 2 weeks ago. Still has discharge from his penis, mild dysuria. Patient is still sexually active with the same people that he got the trichomonas infection. Does not know if they got treated. Does not use condoms for protection. Has not taken any medications for help with his symptoms.   No current facility-administered medications for this encounter.  Current Outpatient Medications:  .  acetaminophen (TYLENOL) 325 MG tablet, Take 2 tablets (650 mg total) by mouth every 6 (six) hours as needed., Disp: 30 tablet, Rfl: 0 .  amoxicillin (AMOXIL) 500 MG capsule, Take 1 capsule (500 mg total) by mouth 2 (two) times daily. (Patient not taking: Reported on 05/06/2020), Disp: 20 capsule, Rfl: 0 .  ibuprofen (ADVIL) 400 MG tablet, Take 1 tablet (400 mg total) by mouth every 6 (six) hours as needed., Disp: 30 tablet, Rfl: 0 .  metroNIDAZOLE (FLAGYL) 500 MG tablet, Take 1 tablet (500 mg total) by mouth 2 (two) times daily. (Patient not taking: Reported on 05/06/2020), Disp: 14 tablet, Rfl: 0 .  naproxen (NAPROSYN) 500 MG tablet, Take 1 tablet (500 mg total) by mouth 2 (two) times daily with a meal., Disp: 30 tablet, Rfl: 0 .  ondansetron (ZOFRAN ODT) 4 MG disintegrating tablet, Take 1 tablet (4 mg total) by mouth every 8 (eight) hours as needed for nausea or vomiting. (Patient not taking: Reported on 05/06/2020), Disp: 10 tablet, Rfl: 0 .  predniSONE (STERAPRED UNI-PAK 21 TAB) 10 MG (21) TBPK tablet, Take by mouth daily. Take 6 tabs by mouth daily  for 2 days, then 5 tabs for 2 days, then 4 tabs for 2 days, then 3 tabs for 2 days, 2 tabs for 2 days, then 1 tab by mouth daily for 2 days (Patient not taking: Reported  on 05/06/2020), Disp: 42 tablet, Rfl: 0   No Known Allergies  No past medical history on file.   No past surgical history on file.  Family History  Problem Relation Age of Onset  . Healthy Mother   . Healthy Father     Social History   Tobacco Use  . Smoking status: Former Smoker    Types: Cigarettes, Cigars    Quit date: 01/10/2019    Years since quitting: 1.3  . Smokeless tobacco: Never Used  . Tobacco comment: black and mild 2x/week  Vaping Use  . Vaping Use: Never used  Substance Use Topics  . Alcohol use: Yes    Comment: occ  . Drug use: No    ROS   Objective:   Vitals: BP 125/81 (BP Location: Right Arm)   Pulse (!) 55   Temp 98.2 F (36.8 C) (Oral)   Resp 18   SpO2 98%   Physical Exam Constitutional:      General: He is not in acute distress.    Appearance: Normal appearance. He is well-developed. He is not ill-appearing, toxic-appearing or diaphoretic.  HENT:     Head: Normocephalic and atraumatic.     Right Ear: External ear normal.     Left Ear: External ear normal.     Nose: Nose normal.     Mouth/Throat:     Mouth: Mucous membranes are moist.  Pharynx: Oropharynx is clear.  Eyes:     General: No scleral icterus.    Extraocular Movements: Extraocular movements intact.     Pupils: Pupils are equal, round, and reactive to light.  Cardiovascular:     Rate and Rhythm: Normal rate and regular rhythm.     Heart sounds: Normal heart sounds. No murmur heard. No friction rub. No gallop.   Pulmonary:     Effort: Pulmonary effort is normal. No respiratory distress.     Breath sounds: Normal breath sounds. No stridor. No wheezing, rhonchi or rales.  Abdominal:     General: There is no distension.     Palpations: Abdomen is soft. There is no mass.     Tenderness: There is no abdominal tenderness. There is no guarding or rebound.     Comments: Hyperactive bowel sounds.  Skin:    General: Skin is warm and dry.  Neurological:     Mental Status: He  is alert and oriented to person, place, and time.  Psychiatric:        Mood and Affect: Mood normal.        Behavior: Behavior normal.        Thought Content: Thought content normal.       Assessment and Plan :   PDMP not reviewed this encounter.  1. Colitis   2. Generalized abdominal pain   3. Diarrhea, unspecified type   4. History of trichomoniasis   5. Penile discharge     Will manage for suspected viral colitis/gastroenteritis with supportive care.  Recommended patient hydrate well, eat light meals and maintain electrolytes.  Will use Zofran and Imodium for nausea, vomiting and diarrhea.  Emphasized need to have his partners tested and treated.  We will hold off on empiric treatment today, STI test results pending.  Counseled patient on potential for adverse effects with medications prescribed/recommended today, ER and return-to-clinic precautions discussed, patient verbalized understanding.    Wallis Bamberg, PA-C 05/06/20 1520

## 2020-05-06 NOTE — Discharge Instructions (Addendum)
Please make sure that you have your sex partners get tested and treated for trichomonas.  Unfortunately be continue to have sex with them and they do not get treated you will continue to have this infection.  I will let you know about your test results as they come back and if you need treatment again for trichomoniasis or any other infection.  Avoid all forms of sexual intercourse (oral, vaginal, anal) for the next 7 days to avoid spreading/reinfecting. Return if symptoms worsen/do not resolve, you develop fever, abdominal pain, blood in your urine, or are re-exposed to an STI.

## 2020-05-06 NOTE — ED Triage Notes (Signed)
Pt sts abd cramping with some diarrhea; pt sts took meds for trichomonas but sts still having sx

## 2020-05-07 LAB — CYTOLOGY, (ORAL, ANAL, URETHRAL) ANCILLARY ONLY
Chlamydia: NEGATIVE
Comment: NEGATIVE
Comment: NEGATIVE
Comment: NORMAL
Neisseria Gonorrhea: NEGATIVE
Trichomonas: NEGATIVE

## 2020-05-08 ENCOUNTER — Ambulatory Visit (HOSPITAL_COMMUNITY)
Admission: EM | Admit: 2020-05-08 | Discharge: 2020-05-08 | Disposition: A | Discharge status: Home / Self Care | Payer: Self-pay

## 2020-05-08 ENCOUNTER — Encounter (HOSPITAL_COMMUNITY): Payer: Self-pay | Admitting: Emergency Medicine

## 2020-05-08 ENCOUNTER — Other Ambulatory Visit: Payer: Self-pay

## 2020-05-08 DIAGNOSIS — R109 Unspecified abdominal pain: Secondary | ICD-10-CM

## 2020-05-08 DIAGNOSIS — K5900 Constipation, unspecified: Secondary | ICD-10-CM

## 2020-05-08 NOTE — ED Provider Notes (Signed)
[facility]    CSN: [id number] Arrival date & time: 05/08/20  1348      History   Chief Complaint Chief Complaint  Patient presents with  . Abdominal Pain    Cramping  . Constipation    HPI Danny Mccoy is a 35 y.o. male.   Patient presenting today following up on his visit several days ago for abdominal pain, diarrhea, nausea vomiting.  He was given Imodium and Zofran at this time for as needed use.  He states that most of his symptoms have improved but he is now a little bit constipated from the Imodium and having some lower abdominal cramping.  The cramping really started after he got his appetite back last night and ate pizza and cookies.  No vomiting the past several days.  Notes he is not drinking as much fluids as he probably should be.  He denies fevers, chills, dysuria, hematuria, weakness, dizziness.     History reviewed. No pertinent past medical history.  Patient Active Problem List   Diagnosis Date Noted  . CONSTIPATION 06/30/2007    History reviewed. No pertinent surgical history.     Home Medications    Prior to Admission medications   Medication Sig Start Date End Date Taking? Authorizing Provider  loperamide (IMODIUM) 2 MG capsule Take 1 capsule (2 mg total) by mouth 2 (two) times daily as needed for diarrhea or loose stools. 05/06/20   Wallis Bamberg, PA-C  ondansetron (ZOFRAN-ODT) 8 MG disintegrating tablet Take 1 tablet (8 mg total) by mouth every 8 (eight) hours as needed for nausea or vomiting. 05/06/20   Wallis Bamberg, PA-C  fluticasone [facility]) 50 MCG/ACT nasal spray Place 1 spray into both nostrils daily. Patient not taking: Reported on 08/10/2018 04/12/17 08/10/18  Georgetta Haber, NP    Family History Family History  Problem Relation Age of Onset  . Healthy Mother   . Healthy Father     Social History Social History   Tobacco Use  . Smoking status: Former Smoker    Types: Cigarettes, Cigars    Quit date: 01/10/2019    Years  since quitting: 1.3  . Smokeless tobacco: Never Used  . Tobacco comment: black and mild 2x/week  Vaping Use  . Vaping Use: Never used  Substance Use Topics  . Alcohol use: Yes    Comment: occ  . Drug use: No     Allergies   Patient has no known allergies.   Review of Systems Review of Systems Per HPI  Physical Exam Triage Vital Signs ED Triage Vitals  Enc Vitals Group     BP 05/08/20 1416 114/73     Pulse Rate 05/08/20 1416 61     Resp 05/08/20 1416 18     Temp 05/08/20 1416 98.1 F (36.7 C)     Temp Source 05/08/20 1416 Oral     SpO2 05/08/20 1416 97 %     Weight --      Height --      Head Circumference --      Peak Flow --      Pain Score 05/08/20 1414 5     Pain Loc --      Pain Edu? --      Excl. in GC? --    No data found.  Updated Vital Signs BP 114/73 (BP Location: Left Arm)   Pulse 61   Temp 98.1 F (36.7 C) (Oral)   Resp 18   SpO2 97%  Visual Acuity Right Eye Distance:   Left Eye Distance:   Bilateral Distance:    Right Eye Near:   Left Eye Near:    Bilateral Near:     Physical Exam Vitals and nursing note reviewed.  Constitutional:      Appearance: Normal appearance.  HENT:     Head: Atraumatic.     Mouth/Throat:     Mouth: Mucous membranes are moist.     Pharynx: Oropharynx is clear.  Eyes:     Extraocular Movements: Extraocular movements intact.     Conjunctiva/sclera: Conjunctivae normal.  Cardiovascular:     Rate and Rhythm: Normal rate and regular rhythm.  Pulmonary:     Effort: Pulmonary effort is normal.     Breath sounds: Normal breath sounds. No wheezing or rales.  Abdominal:     General: Bowel sounds are normal. There is no distension.     Palpations: Abdomen is soft.     Tenderness: There is no abdominal tenderness. There is no right CVA tenderness, left CVA tenderness or guarding.  Musculoskeletal:        General: Normal range of motion.     Cervical back: Normal range of motion and neck supple.  Skin:     General: Skin is warm and dry.  Neurological:     General: No focal deficit present.     Mental Status: He is oriented to person, place, and time.  Psychiatric:        Mood and Affect: Mood normal.        Thought Content: Thought content normal.        Judgment: Judgment normal.      UC Treatments / Results  Labs (all labs ordered are listed, but only abnormal results are displayed) Labs Reviewed - No data to display  EKG   Radiology No results found.  Procedures Procedures (including critical care time)  Medications Ordered in UC Medications - No data to display  Initial Impression / Assessment and Plan / UC Course  I have reviewed the triage vital signs and the nursing notes.  Pertinent labs & imaging results that were available during my care of the patient were reviewed by me and considered in my medical decision making (see chart for details).     Exam and vitals very reassuring.  He appears to be improving from his likely viral GI illness.  Suspect the mild constipation is from the Imodium use.  Discussed MiraLAX, increasing fluids, bland foods.  Will extend work note 2 days to give him more time to rest and hydrate.  Return for acutely worsening symptoms in the meantime.  Final Clinical Impressions(s) / UC Diagnoses   Final diagnoses:  Constipation, unspecified constipation type  Abdominal cramping   Discharge Instructions   None    ED Prescriptions    None     PDMP not reviewed this encounter.   Particia Nearing, [place] 05/08/20 1520

## 2020-05-12 ENCOUNTER — Other Ambulatory Visit: Payer: Self-pay

## 2020-05-12 ENCOUNTER — Encounter (HOSPITAL_COMMUNITY): Payer: Self-pay | Admitting: Emergency Medicine

## 2020-05-12 ENCOUNTER — Emergency Department (HOSPITAL_COMMUNITY)
Admission: EM | Admit: 2020-05-12 | Discharge: 2020-05-12 | Disposition: A | Discharge status: Home / Self Care | Payer: Self-pay | Attending: Emergency Medicine | Admitting: Emergency Medicine

## 2020-05-12 DIAGNOSIS — Z87891 Personal history of nicotine dependence: Secondary | ICD-10-CM | POA: Insufficient documentation

## 2020-05-12 DIAGNOSIS — R599 Enlarged lymph nodes, unspecified: Secondary | ICD-10-CM | POA: Insufficient documentation

## 2020-05-12 DIAGNOSIS — J020 Streptococcal pharyngitis: Secondary | ICD-10-CM | POA: Insufficient documentation

## 2020-05-12 LAB — GROUP A STREP BY PCR: Group A Strep by PCR: DETECTED — AB

## 2020-05-12 MED ORDER — AMOXICILLIN 500 MG PO CAPS: 1000.0000 mg | ORAL_CAPSULE | Freq: Every day | ORAL | 0 refills | Status: AC

## 2020-05-12 MED ORDER — AMOXICILLIN 500 MG PO CAPS
500.0000 mg | ORAL_CAPSULE | Freq: Once | ORAL | Status: AC
  Administered 2020-05-12: 500 mg via ORAL
  Filled 2020-05-12: qty 1

## 2020-05-12 NOTE — ED Provider Notes (Signed)
Patient placed in Quick Look pathway, seen and evaluated   Chief Complaint: sore throat  HPI:   35 yo M who presents with sore throat and subjective fever that started last night.  Patient reports pain with swallowing.  Reports that he was treated for trichomonas last week and concerned this could be due to an STD.  No other known sick contacts, no cough.  Denies penile discharge or dysuria.  ROS: + sore throat, fever  - dysuria, penile discharge  Physical Exam:   Gen: No distress  Neuro: Awake and Alert  Skin: Warm    Focused Exam: Tonsils are edematous with white exudates present   Initiation of care has begun. The patient has been counseled on the process, plan, and necessity for staying for the completion/evaluation, and the remainder of the medical screening examination  MSE was initiated and I personally evaluated the patient and placed orders (if any) at  9:44 AM on May 12, 2020.  The patient appears stable so that the remainder of the MSE may be completed by another provider.   Dartha Lodge, PA-C 05/12/20 9528    Virgina Norfolk, DO 05/12/20 4132

## 2020-05-12 NOTE — Discharge Instructions (Addendum)
You can take Tylenol or Ibuprofen as directed for pain. You can alternate Tylenol and Ibuprofen every 4 hours. If you take Tylenol at 1pm, then you can take Ibuprofen at 5pm. Then you can take Tylenol again at 9pm.   Take antibiotics as directed. Please take all of your antibiotics until finished.  As we discussed, you have gonorrhea and Chlamydia testing pending.  This will take about 2 days to result.  If you are positive, they will notify you.  If you are negative, they do not always notify you.  You could only check online or in the MyChart app regarding your results.  Return emergency department for any worsening sore throat, difficulty breathing, vomiting or any other worsening concerning symptoms.

## 2020-05-12 NOTE — ED Provider Notes (Signed)
[facility] EMERGENCY DEPARTMENT Provider Note   CSN: [id number] Arrival date & time: 05/12/20  6269     History Chief Complaint  Patient presents with  . Sore Throat    Danny Mccoy is a 35 y.o. male who presents for evaluation of sore throat that began yesterday.  He states he had a mild fever of 100.1.  He states that it hurts more when he swallows but he is able to tolerate his secretions.  He took ibuprofen with little improvement.  He has not had any vomiting, difficulty breathing.  He reports that he was treated for trichomonas last week.  He did complete the treatment.  He does report that 4 days ago, he engaged in oral sex.  He has not had any penile discharge, dysuria.  The history is provided by the patient.       History reviewed. No pertinent past medical history.  Patient Active Problem List   Diagnosis Date Noted  . CONSTIPATION 06/30/2007    History reviewed. No pertinent surgical history.     Family History  Problem Relation Age of Onset  . Healthy Mother   . Healthy Father     Social History   Tobacco Use  . Smoking status: Former Smoker    Types: Cigarettes, Cigars    Quit date: 01/10/2019    Years since quitting: 1.3  . Smokeless tobacco: Never Used  . Tobacco comment: black and mild 2x/week  Vaping Use  . Vaping Use: Never used  Substance Use Topics  . Alcohol use: Yes    Comment: occ  . Drug use: No    Home Medications Prior to Admission medications   Medication Sig Start Date End Date Taking? Authorizing Provider  amoxicillin (AMOXIL) 500 MG capsule Take 2 capsules (1,000 mg total) by mouth daily for 10 days. 05/12/20 05/22/20 Yes Curatolo, Adam, DO  loperamide (IMODIUM) 2 MG capsule Take 1 capsule (2 mg total) by mouth 2 (two) times daily as needed for diarrhea or loose stools. 05/06/20   Wallis Bamberg, PA-C  ondansetron (ZOFRAN-ODT) 8 MG disintegrating tablet Take 1 tablet (8 mg total) by mouth every 8 (eight) hours as  needed for nausea or vomiting. 05/06/20   Wallis Bamberg, PA-C  fluticasone [facility]) 50 MCG/ACT nasal spray Place 1 spray into both nostrils daily. Patient not taking: Reported on 08/10/2018 04/12/17 08/10/18  Georgetta Haber, NP    Allergies    Patient has no known allergies.  Review of Systems   Review of Systems  Constitutional: Positive for fever.  HENT: Positive for sore throat. Negative for trouble swallowing.   Respiratory: Negative for shortness of breath.   Gastrointestinal: Negative for vomiting.  All other systems reviewed and are negative.   Physical Exam Updated Vital Signs BP 135/89 (BP Location: Left Arm)   Pulse 78   Temp 99.6 F (37.6 C) (Oral)   Resp 16   SpO2 99%   Physical Exam Vitals and nursing note reviewed.  Constitutional:      Appearance: He is well-developed.  HENT:     Head: Normocephalic and atraumatic.     Mouth/Throat:     Comments: Posterior oropharynx is erythematous, edematous with exudates bilaterally.  Uvula is midline.  Airways patent, phonation is intact. Eyes:     General: No scleral icterus.       Right eye: No discharge.        Left eye: No discharge.     Conjunctiva/sclera: Conjunctivae normal.  Pulmonary:     Effort: Pulmonary effort is normal.  Lymphadenopathy:     Cervical: Cervical adenopathy present.  Skin:    General: Skin is warm and dry.  Neurological:     Mental Status: He is alert.  Psychiatric:        Speech: Speech normal.        Behavior: Behavior normal.     ED Results / Procedures / Treatments   Labs (all labs ordered are listed, but only abnormal results are displayed) Labs Reviewed  GROUP A STREP BY PCR - Abnormal; Notable for the following components:      Result Value   Group A Strep by PCR DETECTED (*)    All other components within normal limits  GC/CHLAMYDIA PROBE AMP ([place]) NOT AT [facility]  GC/CHLAMYDIA PROBE AMP ([place]) NOT AT [facility]    EKG None  Radiology No results  found.  Procedures Procedures   Medications Ordered in ED Medications  amoxicillin (AMOXIL) capsule 500 mg (500 mg Oral Given 05/12/20 1126)    ED Course  I have reviewed the triage vital signs and the nursing notes.  Pertinent labs & imaging results that were available during my care of the patient were reviewed by me and considered in my medical decision making (see chart for details).    MDM Rules/Calculators/A&P                          35 year old male who presents for evaluation of sore throat that began yesterday.  Reports fever of 100.1 last night.  Does report that he recently engaged in oral sex.  On initial arrival, he is afebrile, nontoxic-appearing.  Vital signs are stable.  On exam, his throat is erythematous, edematous with exudates noted bilaterally.  Uvula is midline.  Airways patent, phonation is intact.  He also has some cervical lymphadenopathy.  Consider pharyngitis versus gonococcal infection of the throat.  History/physical exam not concerning for peritonsillar abscess, Ludwig angina.  Strep ordered at triage.  Strep is positive.  Discussed results with patient.  I offered IM shot of antibiotics versus p.o. antibiotics.  Patient wanted to do p.o. antibiotics.  I discussed with him that he has been tested for GC/chlamydia.  I discussed that given that we have a source for his sore throat, we could hold off on treatment for STDs until his testing comes back.  Patient is in agreement.  Patient with no known drug allergies.  He is hemodynamically stable, controlling his secretions with no signs of respiratory distress. At this time, patient exhibits no emergent life-threatening condition that require further evaluation in ED. Patient had ample opportunity for questions and discussion. All patient's questions were answered with full understanding. Strict return precautions discussed. Patient expresses understanding and agreement to plan.   Portions of this note were generated  with Scientist, clinical (histocompatibility and immunogenetics). Dictation errors may occur despite best attempts at proofreading.   Final Clinical Impression(s) / ED Diagnoses Final diagnoses:  Strep pharyngitis    Rx / DC Orders ED Discharge Orders         Ordered    amoxicillin (AMOXIL) 500 MG capsule  Daily        05/12/20 1123           Maxwell Caul, PA-C 05/12/20 1146    Curatolo, Madelaine Bhat, DO 05/12/20 1404

## 2020-05-12 NOTE — ED Triage Notes (Signed)
C/o sore throat, fever, and body aches since last night.  States he tested + for trich last week.

## 2020-05-13 LAB — GC/CHLAMYDIA PROBE AMP (~~LOC~~) NOT AT ARMC
Chlamydia: NEGATIVE
Comment: NEGATIVE
Comment: NORMAL
Neisseria Gonorrhea: NEGATIVE

## 2020-06-12 ENCOUNTER — Ambulatory Visit (HOSPITAL_COMMUNITY)
Admission: EM | Admit: 2020-06-12 | Discharge: 2020-06-12 | Disposition: A | Discharge status: Home / Self Care | Payer: Self-pay | Attending: Family Medicine | Admitting: Family Medicine

## 2020-06-12 ENCOUNTER — Encounter (HOSPITAL_COMMUNITY): Payer: Self-pay | Admitting: Emergency Medicine

## 2020-06-12 ENCOUNTER — Other Ambulatory Visit: Payer: Self-pay

## 2020-06-12 DIAGNOSIS — S161XXA Strain of muscle, fascia and tendon at neck level, initial encounter: Secondary | ICD-10-CM

## 2020-06-12 MED ORDER — IBUPROFEN 800 MG PO TABS: 800.0000 mg | ORAL_TABLET | Freq: Three times a day (TID) | ORAL | 0 refills | Status: DC

## 2020-06-12 MED ORDER — CYCLOBENZAPRINE HCL 10 MG PO TABS: ORAL_TABLET | ORAL | 0 refills | Status: DC
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
## 2020-07-11 DIAGNOSIS — Z87891 Personal history of nicotine dependence: Secondary | ICD-10-CM | POA: Diagnosis not present

## 2020-07-11 DIAGNOSIS — N342 Other urethritis: Secondary | ICD-10-CM | POA: Diagnosis not present

## 2020-07-11 DIAGNOSIS — R369 Urethral discharge, unspecified: Secondary | ICD-10-CM | POA: Diagnosis present

## 2020-07-12 ENCOUNTER — Other Ambulatory Visit: Payer: Self-pay

## 2020-07-12 ENCOUNTER — Emergency Department (HOSPITAL_COMMUNITY)
Admission: EM | Admit: 2020-07-12 | Discharge: 2020-07-12 | Disposition: A | Discharge status: Home / Self Care | Payer: BC Managed Care – PPO | Attending: Emergency Medicine | Admitting: Emergency Medicine

## 2020-07-12 ENCOUNTER — Encounter (HOSPITAL_COMMUNITY): Payer: Self-pay

## 2020-07-12 DIAGNOSIS — N342 Other urethritis: Secondary | ICD-10-CM

## 2020-07-12 LAB — GC/CHLAMYDIA PROBE AMP (~~LOC~~) NOT AT ARMC
Chlamydia: NEGATIVE
Comment: NEGATIVE
Comment: NORMAL
Neisseria Gonorrhea: NEGATIVE

## 2020-07-12 MED ORDER — DOXYCYCLINE HYCLATE 100 MG PO CAPS: 100.0000 mg | ORAL_CAPSULE | Freq: Two times a day (BID) | ORAL | 0 refills | Status: DC

## 2020-07-12 MED ORDER — AZITHROMYCIN 250 MG PO TABS
1000.0000 mg | ORAL_TABLET | Freq: Once | ORAL | Status: AC
  Administered 2020-07-12: 1000 mg via ORAL
  Filled 2020-07-12: qty 4

## 2020-07-12 MED ORDER — CEFTRIAXONE SODIUM 500 MG IJ SOLR
250.0000 mg | Freq: Once | INTRAMUSCULAR | Status: AC
  Administered 2020-07-12: 250 mg via INTRAMUSCULAR
  Filled 2020-07-12: qty 500

## 2020-07-12 NOTE — Discharge Instructions (Addendum)
Take the medication as prescribed for the full 7 days.   If you develop a fever, have worsening symptoms of discharge or any testicular pain, return to the ED for further evaluation.

## 2020-07-12 NOTE — ED Triage Notes (Signed)
Sex 3 days ago and now having discharge and burning.

## 2020-07-12 NOTE — ED Provider Notes (Signed)
[facility] EMERGENCY DEPARTMENT Provider Note   CSN: [id number] Arrival date & time: 07/11/20  2359     History Chief Complaint  Patient presents with  . Penile Discharge    Danny Mccoy is a 35 y.o. male.  Patient to ED with c/o penile discharge, 'stinging' when urinating, that started today. He reports unprotected sex 3 days ago. No abdominal pain. No fever, nausea, vomiting.   The history is provided by the patient. No language interpreter was used.  Penile Discharge Pertinent negatives include no abdominal pain.       History reviewed. No pertinent past medical history.  Patient Active Problem List   Diagnosis Date Noted  . CONSTIPATION 06/30/2007    History reviewed. No pertinent surgical history.     Family History  Problem Relation Age of Onset  . Healthy Mother   . Healthy Father     Social History   Tobacco Use  . Smoking status: Former Smoker    Types: Cigarettes, Cigars    Quit date: 01/10/2019    Years since quitting: 1.5  . Smokeless tobacco: Never Used  . Tobacco comment: black and mild 2x/week  Vaping Use  . Vaping Use: Never used  Substance Use Topics  . Alcohol use: Yes    Comment: occ  . Drug use: No    Home Medications Prior to Admission medications   Medication Sig Start Date End Date Taking? Authorizing Provider  cyclobenzaprine (FLEXERIL) 10 MG tablet Take 1 tablet by mouth 3 times daily as needed for muscle spasm. Warning: May cause drowsiness. 06/12/20   Mardella Layman, MD  ibuprofen (ADVIL) 800 MG tablet Take 1 tablet (800 mg total) by mouth 3 (three) times daily with meals. 06/12/20   Mardella Layman, MD  loperamide (IMODIUM) 2 MG capsule Take 1 capsule (2 mg total) by mouth 2 (two) times daily as needed for diarrhea or loose stools. 05/06/20   Wallis Bamberg, PA-C  ondansetron (ZOFRAN-ODT) 8 MG disintegrating tablet Take 1 tablet (8 mg total) by mouth every 8 (eight) hours as needed for nausea or vomiting. 05/06/20    Wallis Bamberg, PA-C  fluticasone [facility]) 50 MCG/ACT nasal spray Place 1 spray into both nostrils daily. Patient not taking: Reported on 08/10/2018 04/12/17 08/10/18  Georgetta Haber, NP    Allergies    Patient has no known allergies.  Review of Systems   Review of Systems  Constitutional: Negative for chills and fever.  Gastrointestinal: Negative for abdominal pain and nausea.  Genitourinary: Positive for dysuria and penile discharge. Negative for scrotal swelling and testicular pain.  Musculoskeletal: Negative for myalgias.    Physical Exam Updated Vital Signs BP 123/79 (BP Location: Right Arm)   Pulse 72   Temp 98.8 F (37.1 C)   Resp 17   Ht 6' (1.829 m)   Wt 90.7 kg   SpO2 97%   BMI 27.12 kg/m   Physical Exam Vitals and nursing note reviewed.  Constitutional:      Appearance: He is well-developed.  Pulmonary:     Effort: Pulmonary effort is normal.  Abdominal:     General: There is no distension.     Palpations: Abdomen is soft.     Tenderness: There is no abdominal tenderness.  Genitourinary:    Comments: No inguinal lymphadenopathy.  Musculoskeletal:        General: Normal range of motion.     Cervical back: Normal range of motion.  Skin:    General: Skin is  warm and dry.  Neurological:     Mental Status: He is alert and oriented to person, place, and time.     ED Results / Procedures / Treatments   Labs (all labs ordered are listed, but only abnormal results are displayed) Labs Reviewed - No data to display  EKG None  Radiology No results found.  Procedures Procedures   Medications Ordered in ED Medications - No data to display  ED Course  I have reviewed the triage vital signs and the nursing notes.  Pertinent labs & imaging results that were available during my care of the patient were reviewed by me and considered in my medical decision making (see chart for details).    MDM Rules/Calculators/A&P                          Patient to ED  with penile d/ch after unprotected intercourse 3 days ago. No fever.   GC ordered. Will treat with Rocephin, zithromax. Rx Doxy x 7 days. Patient instructed on obtaining results of test in 2 days on MyChart. Return precautions discussed.   Final Clinical Impression(s) / ED Diagnoses Final diagnoses:  None   1. Urethritis   Rx / DC Orders ED Discharge Orders    None       Danne Harbor 07/12/20 0022    Mesner, Barbara Cower, MD 07/12/20 [phone]

## 2020-08-29 ENCOUNTER — Other Ambulatory Visit: Payer: Self-pay

## 2020-08-29 ENCOUNTER — Emergency Department (HOSPITAL_COMMUNITY)
Admission: EM | Admit: 2020-08-29 | Discharge: 2020-08-29 | Disposition: A | Discharge status: Home / Self Care | Payer: BC Managed Care – PPO | Attending: Emergency Medicine | Admitting: Emergency Medicine

## 2020-08-29 DIAGNOSIS — Z87891 Personal history of nicotine dependence: Secondary | ICD-10-CM | POA: Insufficient documentation

## 2020-08-29 DIAGNOSIS — B349 Viral infection, unspecified: Secondary | ICD-10-CM

## 2020-08-29 DIAGNOSIS — Z20822 Contact with and (suspected) exposure to covid-19: Secondary | ICD-10-CM | POA: Diagnosis not present

## 2020-08-29 DIAGNOSIS — R0602 Shortness of breath: Secondary | ICD-10-CM | POA: Diagnosis present

## 2020-08-29 LAB — CBC WITH DIFFERENTIAL/PLATELET
Abs Immature Granulocytes: 0.01 10*3/uL (ref 0.00–0.07)
Basophils Absolute: 0 10*3/uL (ref 0.0–0.1)
Basophils Relative: 1 %
Eosinophils Absolute: 0.1 10*3/uL (ref 0.0–0.5)
Eosinophils Relative: 2 %
HCT: 44.2 % (ref 39.0–52.0)
Hemoglobin: 15.4 g/dL (ref 13.0–17.0)
Immature Granulocytes: 0 %
Lymphocytes Relative: 47 %
Lymphs Abs: 2.8 10*3/uL (ref 0.7–4.0)
MCH: 31.9 pg (ref 26.0–34.0)
MCHC: 34.8 g/dL (ref 30.0–36.0)
MCV: 91.5 fL (ref 80.0–100.0)
Monocytes Absolute: 0.6 10*3/uL (ref 0.1–1.0)
Monocytes Relative: 11 %
Neutro Abs: 2.3 10*3/uL (ref 1.7–7.7)
Neutrophils Relative %: 39 %
Platelets: 242 10*3/uL (ref 150–400)
RBC: 4.83 MIL/uL (ref 4.22–5.81)
RDW: 13.7 % (ref 11.5–15.5)
WBC: 5.9 10*3/uL (ref 4.0–10.5)
nRBC: 0 % (ref 0.0–0.2)

## 2020-08-29 LAB — RESP PANEL BY RT-PCR (FLU A&B, COVID) ARPGX2
Influenza A by PCR: NEGATIVE
Influenza B by PCR: NEGATIVE
SARS Coronavirus 2 by RT PCR: NEGATIVE

## 2020-08-29 LAB — COMPREHENSIVE METABOLIC PANEL
ALT: 15 U/L (ref 0–44)
AST: 14 U/L — ABNORMAL LOW (ref 15–41)
Albumin: 4.2 g/dL (ref 3.5–5.0)
Alkaline Phosphatase: 60 U/L (ref 38–126)
Anion gap: 7 (ref 5–15)
BUN: 13 mg/dL (ref 6–20)
CO2: 26 mmol/L (ref 22–32)
Calcium: 8.9 mg/dL (ref 8.9–10.3)
Chloride: 105 mmol/L (ref 98–111)
Creatinine, Ser: 1.2 mg/dL (ref 0.61–1.24)
GFR, Estimated: >60 mL/min (ref >60)
Glucose, Bld: 87 mg/dL (ref 70–99)
Potassium: 3.7 mmol/L (ref 3.5–5.1)
Sodium: 138 mmol/L (ref 135–145)
Total Bilirubin: 3 mg/dL — ABNORMAL HIGH (ref 0.3–1.2)
Total Protein: 6.9 g/dL (ref 6.5–8.1)

## 2020-08-29 LAB — CK: Total CK: 136 U/L (ref 49–397)

## 2020-08-29 LAB — TROPONIN I (HIGH SENSITIVITY): Troponin I (High Sensitivity): 4 ng/L (ref <18)

## 2020-08-29 MED ORDER — KETOROLAC TROMETHAMINE 15 MG/ML IJ SOLN: 15.0000 mg | Freq: Once | INTRAMUSCULAR | Status: DC

## 2020-08-29 MED ORDER — SODIUM CHLORIDE 0.9 % IV BOLUS: 1000.0000 mL | Freq: Once | INTRAVENOUS | Status: DC

## 2020-08-29 NOTE — ED Provider Notes (Signed)
MSE was initiated and I personally evaluated the patient and placed orders (if any) at  3:25 AM on August 29, 2020.  Patient here with diarrhea, SOB, body aches. No fever. Concerned he has COVID.   Today's Vitals   08/29/20 0249 08/29/20 0300  BP: 125/77   Pulse: (!) 59   Resp: 20   Temp: 98.6 F (37 C)   TempSrc: Oral   SpO2: 100%   PainSc:  5    There is no height or weight on file to calculate BMI.  Dr. Pilar Plate has seen the patient is triage secondary to abnormal EKG. Patient denies chest pain. Cardiology consulted who advises more likely due to cause other than ACS - ie dehydration, infection.   Labs pending.   The patient appears stable so that the remainder of the MSE may be completed by another provider.   Elpidio Anis, PA-C 08/29/20 0327    Sabas Sous, MD 08/29/20 [phone]

## 2020-08-29 NOTE — Discharge Instructions (Signed)
You were evaluated in the Emergency Department and after careful evaluation, we did not find any emergent condition requiring admission or further testing in the hospital.  Your exam/testing today was overall reassuring.  Symptoms seem to be due to a viral illness.  Your blood testing today was normal.  Your COVID test was negative.  Recommend plenty of fluids at home, Tylenol or Motrin for discomfort.  Please return to the Emergency Department if you experience any worsening of your condition.  Thank you for allowing [place] to be a part of your care.

## 2020-08-29 NOTE — ED Provider Notes (Signed)
[facility] Emergency Department Provider Note MRN:  [id number]  Arrival date & time: 08/29/20     Chief Complaint   Shortness of Breath and Generalized Body Aches   History of Present Illness   Danny Mccoy is a 35 y.o. year-old male with no pertinent past medical presenting to the ED with chief complaint of shortness of breath and body aches.  2 or 3 days of body aches, fatigue, malaise, feeling short of breath or very tired with any activity, had to leave work today.  Denies any chest pain, unsure if he is having fevers, no leg pain or swelling, no abdominal pain.  Nausea but no vomiting, also endorsing diarrhea for the past few days.  Review of Systems  A complete 10 system review of systems was obtained and all systems are negative except as noted in the HPI and PMH.   Patient's Health History   No past medical history on file.  No past surgical history on file.  Family History  Problem Relation Age of Onset   Healthy Mother    Healthy Father     Social History   Socioeconomic History   Marital status: Single    Spouse name: Not on file   Number of children: Not on file   Years of education: Not on file   Highest education level: Not on file  Occupational History   Not on file  Tobacco Use   Smoking status: Former    Types: Cigarettes, Cigars    Quit date: 01/10/2019    Years since quitting: 1.6   Smokeless tobacco: Never   Tobacco comments:    black and mild 2x/week  Vaping Use   Vaping Use: Never used  Substance and Sexual Activity   Alcohol use: Yes    Comment: occ   Drug use: No   Sexual activity: Yes    Birth control/protection: Condom  Other Topics Concern   Not on file  Social History Narrative   Not on file   Social Determinants of Health   Financial Resource Strain: Not on file  Food Insecurity: Not on file  Transportation Needs: Not on file  Physical Activity: Not on file  Stress: Not on file  Social  Connections: Not on file  Intimate Partner Violence: Not on file     Physical Exam   Vitals:   08/29/20 0249 08/29/20 0529  BP: 125/77 114/73  Pulse: (!) 59 (!) 55  Resp: 20 14  Temp: 98.6 F (37 C) 98.3 F (36.8 C)  SpO2: 100% 99%    CONSTITUTIONAL: Well-appearing, NAD NEURO:  Alert and oriented x 3, no focal deficits EYES:  eyes equal and reactive ENT/NECK:  no LAD, no JVD CARDIO: Regular rate, well-perfused, normal S1 and S2 PULM:  CTAB no wheezing or rhonchi GI/GU:  normal bowel sounds, non-distended, non-tender MSK/SPINE:  No gross deformities, no edema SKIN:  no rash, atraumatic PSYCH:  Appropriate speech and behavior  *Additional and/or pertinent findings included in MDM below  Diagnostic and Interventional Summary    EKG Interpretation  Date/Time:  Thursday August 29 2020 02:47:01 EDT Ventricular Rate:  58 PR Interval:  180 QRS Duration: 98 QT Interval:  404 QTC Calculation: 396 R Axis:   67 Text Interpretation: Sinus bradycardia T wave abnormality, consider inferolateral ischemia Abnormal ECG Confirmed by Gerlene Fee [phone]) on 08/29/2020 3:54:16 AM       Labs Reviewed  COMPREHENSIVE METABOLIC PANEL - Abnormal; Notable for the following components:  Result Value   AST 14 (*)    Total Bilirubin 3.0 (*)    All other components within normal limits  RESP PANEL BY RT-PCR (FLU A&B, COVID) ARPGX2  CBC WITH DIFFERENTIAL/PLATELET  CK  TROPONIN I (HIGH SENSITIVITY)    No orders to display    Medications  sodium chloride 0.9 % bolus 1,000 mL (has no administration in time range)  ketorolac (TORADOL) 15 MG/ML injection 15 mg (has no administration in time range)     Procedures  /  Critical Care Procedures  ED Course and Medical Decision Making  I have reviewed the triage vital signs, the nursing notes, and pertinent available records from the EMR.  Listed above are laboratory and imaging tests that I personally ordered, reviewed, and interpreted  and then considered in my medical decision making (see below for details).  Suspect viral illness, possibly COVID-19.  Patient is sitting comfortably in no acute distress, normal vital signs, clear lungs, benign abdomen.  Having no chest pain whatsoever.  EKG with some significant changes, some ST elevation noted laterally with some inferior T wave inversions.  EKG reviewed by Dr. Carmel Sacramento of cardiology, agrees that STEMI criteria is not met, especially given patient's clinical picture.  Seems more likely to be a J-point elevation in the setting of dehydration or electrolyte disturbance.  Will evaluate with labs, troponin.     Work-up is reassuring, normal labs, negative troponin.  Patient continues to look and feel well, continues to have no chest pain.  Appropriate for discharge.  Barth Kirks. [place]onia Small, [place] [email or web address]  Final Clinical Impressions(s) / ED Diagnoses     ICD-10-CM   1. Viral illness  B34.9       ED Discharge Orders     None        Discharge Instructions Discussed with and Provided to Patient:     Discharge Instructions      You were evaluated in the Emergency Department and after careful evaluation, we did not find any emergent condition requiring admission or further testing in the hospital.  Your exam/testing today was overall reassuring.  Symptoms seem to be due to a viral illness.  Your blood testing today was normal.  Your COVID test was negative.  Recommend plenty of fluids at home, Tylenol or Motrin for discomfort.  Please return to the Emergency Department if you experience any worsening of your condition.  Thank you for allowing [place] to be a part of your care.         Maudie Flakes, MD 08/29/20 [phone]

## 2020-08-29 NOTE — ED Triage Notes (Signed)
SOB x 1 week, diarrhea x 1 episode today and body aches x 2 days.    Vaccinated.

## 2020-09-02 ENCOUNTER — Ambulatory Visit (INDEPENDENT_AMBULATORY_CARE_PROVIDER_SITE_OTHER): Payer: BC Managed Care – PPO

## 2020-09-02 ENCOUNTER — Other Ambulatory Visit: Payer: Self-pay

## 2020-09-02 ENCOUNTER — Ambulatory Visit (HOSPITAL_COMMUNITY)
Admission: EM | Admit: 2020-09-02 | Discharge: 2020-09-02 | Disposition: A | Discharge status: Home / Self Care | Payer: BC Managed Care – PPO | Attending: Internal Medicine | Admitting: Internal Medicine

## 2020-09-02 ENCOUNTER — Encounter (HOSPITAL_COMMUNITY): Payer: Self-pay | Admitting: *Deleted

## 2020-09-02 DIAGNOSIS — R5383 Other fatigue: Secondary | ICD-10-CM

## 2020-09-02 DIAGNOSIS — Z20822 Contact with and (suspected) exposure to covid-19: Secondary | ICD-10-CM | POA: Diagnosis not present

## 2020-09-02 DIAGNOSIS — R0602 Shortness of breath: Secondary | ICD-10-CM

## 2020-09-02 DIAGNOSIS — Z113 Encounter for screening for infections with a predominantly sexual mode of transmission: Secondary | ICD-10-CM

## 2020-09-02 LAB — HIV ANTIBODY (ROUTINE TESTING W REFLEX): HIV Screen 4th Generation wRfx: NONREACTIVE

## 2020-09-02 LAB — COMPREHENSIVE METABOLIC PANEL
ALT: 16 U/L (ref 0–44)
AST: 19 U/L (ref 15–41)
Albumin: 4 g/dL (ref 3.5–5.0)
Alkaline Phosphatase: 67 U/L (ref 38–126)
Anion gap: 7 (ref 5–15)
BUN: 11 mg/dL (ref 6–20)
CO2: 25 mmol/L (ref 22–32)
Calcium: 9 mg/dL (ref 8.9–10.3)
Chloride: 106 mmol/L (ref 98–111)
Creatinine, Ser: 0.93 mg/dL (ref 0.61–1.24)
GFR, Estimated: >60 mL/min (ref >60)
Glucose, Bld: 127 mg/dL — ABNORMAL HIGH (ref 70–99)
Potassium: 3.9 mmol/L (ref 3.5–5.1)
Sodium: 138 mmol/L (ref 135–145)
Total Bilirubin: 1.4 mg/dL — ABNORMAL HIGH (ref 0.3–1.2)
Total Protein: 6.9 g/dL (ref 6.5–8.1)

## 2020-09-02 LAB — CBC
HCT: 44.2 % (ref 39.0–52.0)
Hemoglobin: 15.7 g/dL (ref 13.0–17.0)
MCH: 32.2 pg (ref 26.0–34.0)
MCHC: 35.5 g/dL (ref 30.0–36.0)
MCV: 90.6 fL (ref 80.0–100.0)
Platelets: 239 10*3/uL (ref 150–400)
RBC: 4.88 MIL/uL (ref 4.22–5.81)
RDW: 13.2 % (ref 11.5–15.5)
WBC: 6 10*3/uL (ref 4.0–10.5)
nRBC: 0 % (ref 0.0–0.2)

## 2020-09-02 LAB — SARS CORONAVIRUS 2 (TAT 6-24 HRS): SARS Coronavirus 2: NEGATIVE

## 2020-09-02 LAB — CK: Total CK: 95 U/L (ref 49–397)

## 2020-09-02 NOTE — Discharge Instructions (Addendum)
Please follow-up with [facility] cardiology tomorrow to set up an appointment for further evaluation and management.  Please go to the hospital if chest pain develops or if your shortness of breath worsens.  Your chest x-ray was negative.  Your COVID-19 viral swab is pending.  We will call if these are positive.  Your blood work and STD swab are also pending.  We will call if these are positive

## 2020-09-02 NOTE — ED Triage Notes (Signed)
Pt wants a recheck  of COVID  due to fatigue . Last checked on 7-21

## 2020-09-02 NOTE — ED Provider Notes (Signed)
[facility]    CSN: [id number] Arrival date & time: 09/02/20  1145      History   Chief Complaint Chief Complaint  Patient presents with   Fatigue   Exposure to STD    HPI Danny Mccoy is a 35 y.o. male.   Patient presents to the urgent care with approximately 1 week history of intermittent shortness of breath and fatigue.  Was evaluated on 08/29/2020 at the ED for same symptoms.  Patient states that he also had body aches at that time.  Was tested for COVID and flu that were both negative.  EKG was completed showing ST elevation.  Further work-up was completed at ED with negative troponin and CK along with consult with cardiologist on abnormal EKG.  Patient was discharged with suspected viral upper respiratory illness.  Patient currently denies any upper respiratory symptoms and states body aches have resolved.  Denies any known fevers at home.  Denies any nausea, vomiting, diarrhea, abdominal pain.  Has been drinking plenty of oral fluids and eating but does have decreased appetite.  Patient is also requesting STD testing to determine if this could be cause of symptoms.  Denies any known exposure to STD.  Denies any cardiac or medical history.   Exposure to STD   History reviewed. No pertinent past medical history.  Patient Active Problem List   Diagnosis Date Noted   CONSTIPATION 06/30/2007    History reviewed. No pertinent surgical history.     Home Medications    Prior to Admission medications   Medication Sig Start Date End Date Taking? Authorizing Provider  cyclobenzaprine (FLEXERIL) 10 MG tablet Take 1 tablet by mouth 3 times daily as needed for muscle spasm. Warning: May cause drowsiness. 06/12/20   Mardella Layman, MD  doxycycline (VIBRAMYCIN) 100 MG capsule Take 1 capsule (100 mg total) by mouth 2 (two) times daily. 07/12/20   Elpidio Anis, PA-C  ibuprofen (ADVIL) 800 MG tablet Take 1 tablet (800 mg total) by mouth 3 (three) times daily with meals.  06/12/20   Mardella Layman, MD  loperamide (IMODIUM) 2 MG capsule Take 1 capsule (2 mg total) by mouth 2 (two) times daily as needed for diarrhea or loose stools. 05/06/20   Wallis Bamberg, PA-C  ondansetron (ZOFRAN-ODT) 8 MG disintegrating tablet Take 1 tablet (8 mg total) by mouth every 8 (eight) hours as needed for nausea or vomiting. 05/06/20   Wallis Bamberg, PA-C  fluticasone [facility]) 50 MCG/ACT nasal spray Place 1 spray into both nostrils daily. Patient not taking: Reported on 08/10/2018 04/12/17 08/10/18  Georgetta Haber, NP    Family History Family History  Problem Relation Age of Onset   Healthy Mother    Healthy Father     Social History Social History   Tobacco Use   Smoking status: Former    Types: Cigarettes, Cigars    Quit date: 01/10/2019    Years since quitting: 1.6   Smokeless tobacco: Never   Tobacco comments:    black and mild 2x/week  Vaping Use   Vaping Use: Never used  Substance Use Topics   Alcohol use: Yes    Comment: occ   Drug use: No     Allergies   Patient has no known allergies.   Review of Systems Review of Systems Per HPI  Physical Exam Triage Vital Signs ED Triage Vitals  Enc Vitals Group     BP 09/02/20 1425 (!) 114/56     Pulse Rate 09/02/20 1425 65  Resp 09/02/20 1425 18     Temp 09/02/20 1425 98.6 F (37 C)     Temp src --      SpO2 09/02/20 1425 100 %     Weight --      Height --      Head Circumference --      Peak Flow --      Pain Score 09/02/20 1427 0     Pain Loc --      Pain Edu? --      Excl. in GC? --    No data found.  Updated Vital Signs BP (!) 114/56   Pulse 65   Temp 98.6 F (37 C)   Resp 18   SpO2 100%   Visual Acuity Right Eye Distance:   Left Eye Distance:   Bilateral Distance:    Right Eye Near:   Left Eye Near:    Bilateral Near:     Physical Exam Constitutional:      General: He is not in acute distress.    Appearance: He is not ill-appearing.  HENT:     Head: Normocephalic.     Right Ear:  Tympanic membrane and ear canal normal.     Left Ear: Tympanic membrane and ear canal normal.     Nose: Nose normal.     Mouth/Throat:     Mouth: Mucous membranes are moist.     Pharynx: No posterior oropharyngeal erythema.  Eyes:     Conjunctiva/sclera: Conjunctivae normal.  Cardiovascular:     Rate and Rhythm: Normal rate and regular rhythm.     Pulses: Normal pulses.     Heart sounds: Normal heart sounds.  Pulmonary:     Effort: Pulmonary effort is normal. No respiratory distress.     Breath sounds: Normal breath sounds. No wheezing, rhonchi or rales.  Abdominal:     General: Abdomen is flat. Bowel sounds are normal. There is no distension.     Palpations: Abdomen is soft.  Genitourinary:    Comments: Deferred with shared decision making.  Self swab performed. Skin:    General: Skin is warm and dry.  Neurological:     General: No focal deficit present.     Mental Status: He is alert and oriented to person, place, and time. Mental status is at baseline.  Psychiatric:        Mood and Affect: Mood normal.        Behavior: Behavior normal.        Thought Content: Thought content normal.        Judgment: Judgment normal.     UC Treatments / Results  Labs (all labs ordered are listed, but only abnormal results are displayed) Labs Reviewed  SARS CORONAVIRUS 2 (TAT 6-24 HRS)  CBC  HIV ANTIBODY (ROUTINE TESTING W REFLEX)  COMPREHENSIVE METABOLIC PANEL  CK  CYTOLOGY, (ORAL, ANAL, URETHRAL) ANCILLARY ONLY    EKG   Radiology DG Chest 2 View  Result Date: 09/02/2020 CLINICAL DATA:  Shortness of breath and fatigue for 1 week. The patient is COVID-19 positive. EXAM: CHEST - 2 VIEW COMPARISON:  PA and lateral chest 10/15/2019. FINDINGS: Lungs clear. Heart size normal. No pneumothorax or pleural fluid. No bony abnormality. IMPRESSION: Normal exam. Electronically Signed   By: Drusilla Kanner M.D.   On: 09/02/2020 15:57    Procedures Procedures (including critical care  time)  Medications Ordered in UC Medications - No data to display  Initial Impression / Assessment and Plan /  UC Course  I have reviewed the triage vital signs and the nursing notes.  Pertinent labs & imaging results that were available during my care of the patient were reviewed by me and considered in my medical decision making (see chart for details).     COVID-19 viral swab retest pending.  EKG completed showing "normal sinus rhythm with Incomplete right bundle branch block, Minimal voltage criteria for LVH, may be normal variant ( Cornell product ) Septal infarct , age undetermined, Abnormal ECG."  Patient does not currently have any chest pain or appear to be in any acute distress so do not think additional work-up in ER is necessary at this time.  EKG tracing is very similar to EKGs that were completed in ER a few days ago.  Chest x-ray also negative for any acute cardiopulmonary process.  Will refer patient to cardiology for further work-up and management.  Patient advised to go to the hospital if symptoms significantly worsen.  Low suspicion for viral respiratory illness still causing symptoms due to physical exam. STD swab and HIV blood work pending per patient request. Discussed strict return precautions. Patient verbalized understanding and is agreeable with plan.  Final Clinical Impressions(s) / UC Diagnoses   Final diagnoses:  Shortness of breath  Other fatigue  Screening for venereal disease  Encounter for laboratory testing for COVID-19 virus     Discharge Instructions      Please follow-up with [facility] cardiology tomorrow to set up an appointment for further evaluation and management.  Please go to the hospital if chest pain develops or if your shortness of breath worsens.  Your chest x-ray was negative.  Your COVID-19 viral swab is pending.  We will call if these are positive.  Your blood work and STD swab are also pending.  We will call if these are  positive     ED Prescriptions   None    PDMP not reviewed this encounter.   Lance Muss, FNP 09/02/20 [phone]

## 2020-09-03 LAB — CYTOLOGY, (ORAL, ANAL, URETHRAL) ANCILLARY ONLY
Chlamydia: NEGATIVE
Comment: NEGATIVE
Comment: NEGATIVE
Comment: NORMAL
Neisseria Gonorrhea: NEGATIVE
Trichomonas: NEGATIVE

## 2020-11-05 ENCOUNTER — Emergency Department (HOSPITAL_COMMUNITY): Payer: BC Managed Care – PPO

## 2020-11-05 ENCOUNTER — Observation Stay (HOSPITAL_COMMUNITY)
Admission: EM | Admit: 2020-11-05 | Discharge: 2020-11-07 | Disposition: A | Discharge status: Home / Self Care | Payer: BC Managed Care – PPO | Attending: Emergency Medicine | Admitting: Emergency Medicine

## 2020-11-05 ENCOUNTER — Other Ambulatory Visit: Payer: Self-pay

## 2020-11-05 DIAGNOSIS — Z20822 Contact with and (suspected) exposure to covid-19: Secondary | ICD-10-CM | POA: Diagnosis not present

## 2020-11-05 DIAGNOSIS — Z8616 Personal history of COVID-19: Secondary | ICD-10-CM | POA: Insufficient documentation

## 2020-11-05 DIAGNOSIS — R778 Other specified abnormalities of plasma proteins: Secondary | ICD-10-CM

## 2020-11-05 DIAGNOSIS — Z79899 Other long term (current) drug therapy: Secondary | ICD-10-CM | POA: Diagnosis not present

## 2020-11-05 DIAGNOSIS — M6282 Rhabdomyolysis: Secondary | ICD-10-CM | POA: Insufficient documentation

## 2020-11-05 DIAGNOSIS — D72829 Elevated white blood cell count, unspecified: Secondary | ICD-10-CM | POA: Insufficient documentation

## 2020-11-05 DIAGNOSIS — Z87891 Personal history of nicotine dependence: Secondary | ICD-10-CM | POA: Insufficient documentation

## 2020-11-05 DIAGNOSIS — R17 Unspecified jaundice: Secondary | ICD-10-CM | POA: Insufficient documentation

## 2020-11-05 DIAGNOSIS — R55 Syncope and collapse: Secondary | ICD-10-CM | POA: Diagnosis present

## 2020-11-05 DIAGNOSIS — N179 Acute kidney failure, unspecified: Secondary | ICD-10-CM | POA: Insufficient documentation

## 2020-11-05 LAB — URINALYSIS, ROUTINE W REFLEX MICROSCOPIC
Bacteria, UA: NONE SEEN
Bilirubin Urine: NEGATIVE
Glucose, UA: NEGATIVE mg/dL
Ketones, ur: NEGATIVE mg/dL
Leukocytes,Ua: NEGATIVE
Nitrite: NEGATIVE
Protein, ur: NEGATIVE mg/dL
Specific Gravity, Urine: 1.012 (ref 1.005–1.030)
pH: 5 (ref 5.0–8.0)

## 2020-11-05 NOTE — ED Triage Notes (Signed)
Pt states having a syncopal episode PTA at work, woke up with EMS and refused transport, drove self here. C/o head pain from fall. Prior to episode he believes he was having "an anxiety attack," decreased sleep, and working in a high stress environment. Has had a similar episode when he was younger. Currently c/o dizziness, weakness, and body aches.

## 2020-11-05 NOTE — ED Provider Notes (Signed)
Emergency Medicine Provider Triage Evaluation Note  Danny Mccoy , a 35 y.o. male  was evaluated in triage.  Pt complains of syncope.  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  He denies any prodromal symptoms prior to the episode.  He struck his head in the fall.  Reports that he has been having diffuse muscle cramps and aches since the fall.  He is concerned that he may have passed out due to stress.  His partner is pregnant and due with her second child in March.  He was unable to sleep all night last night due to increased stress.  He reports that he is also not been eating and drinking as well today.  He is only voided 2-3 times, which is less than his baseline.  He has a headache and states that he is feeling lightheaded.  He reports that he has been feeling generally unwell all day.  He also endorses shortness of breath and some aching pain in his chest.  No abdominal pain, fever, chills, vomiting, rash.  Denies illicit or recreational substance use.  He is concerned that he may have had a panic attack, which caused him to pass out.  States that he had a similar episode many years ago that he thought may be due to stress.  Review of Systems  Positive: Syncope, lightheadedness, headache, chest pain, shortness of breath, myalgias, arthralgias Negative: Fever, chills, abdominal pain, vomiting, rash  Physical Exam  BP 121/66 (BP Location: Right Arm)   Pulse 98   Temp 98.8 F (37.1 C)   Resp 20   Ht 6' (1.829 m)   Wt 90.3 kg   SpO2 98%   BMI 26.99 kg/m  Gen:   Awake, no distress   Resp:  Normal effort  MSK:   Moves extremities without difficulty  Other:  Mild grayish tent to the skin.  Alert and oriented x3.  Mild tenderness palpation to the posterior scalp.  No crepitus or step-offs.  Full active and passive range of motion of the cervical spine without midline tenderness.  Medical Decision Making  Medically screening exam initiated at  10:38 PM.  Appropriate orders placed.  Danny Mccoy was informed that the remainder of the evaluation will be completed by another provider, this initial triage assessment does not replace that evaluation, and the importance of remaining in the ED until their evaluation is complete.  Patient had a syncopal episode without prodromal symptoms earlier tonight.  Reports that he has been feeling unwell for most of the day and also adds that he has had increasing anxiety.  He has no midline of the cervical, thoracic, or lumbar spine.  However, given head trauma with syncope will order CT head as well as basic labs.  He will require further work-up and evaluation in the emergency department.   Barkley Boards, PA-C 11/05/20 2248    Koleen Distance, MD 11/05/20 [phone]

## 2020-11-06 ENCOUNTER — Observation Stay (HOSPITAL_BASED_OUTPATIENT_CLINIC_OR_DEPARTMENT_OTHER): Payer: BC Managed Care – PPO

## 2020-11-06 ENCOUNTER — Observation Stay (HOSPITAL_COMMUNITY): Payer: BC Managed Care – PPO

## 2020-11-06 ENCOUNTER — Emergency Department (HOSPITAL_COMMUNITY): Payer: BC Managed Care – PPO

## 2020-11-06 DIAGNOSIS — R55 Syncope and collapse: Secondary | ICD-10-CM | POA: Diagnosis not present

## 2020-11-06 LAB — COMPREHENSIVE METABOLIC PANEL
ALT: 34 U/L (ref 0–44)
AST: 39 U/L (ref 15–41)
Albumin: 4.7 g/dL (ref 3.5–5.0)
Alkaline Phosphatase: 62 U/L (ref 38–126)
Anion gap: 9 (ref 5–15)
BUN: 14 mg/dL (ref 6–20)
CO2: 26 mmol/L (ref 22–32)
Calcium: 9.5 mg/dL (ref 8.9–10.3)
Chloride: 102 mmol/L (ref 98–111)
Creatinine, Ser: 1.48 mg/dL — ABNORMAL HIGH (ref 0.61–1.24)
GFR, Estimated: >60 mL/min (ref >60)
Glucose, Bld: 82 mg/dL (ref 70–99)
Potassium: 3.9 mmol/L (ref 3.5–5.1)
Sodium: 137 mmol/L (ref 135–145)
Total Bilirubin: 1.8 mg/dL — ABNORMAL HIGH (ref 0.3–1.2)
Total Protein: 7.8 g/dL (ref 6.5–8.1)

## 2020-11-06 LAB — RESP PANEL BY RT-PCR (FLU A&B, COVID) ARPGX2
Influenza A by PCR: NEGATIVE
Influenza B by PCR: NEGATIVE
SARS Coronavirus 2 by RT PCR: NEGATIVE

## 2020-11-06 LAB — BASIC METABOLIC PANEL
Anion gap: 7 (ref 5–15)
BUN: 12 mg/dL (ref 6–20)
CO2: 23 mmol/L (ref 22–32)
Calcium: 8.3 mg/dL — ABNORMAL LOW (ref 8.9–10.3)
Chloride: 107 mmol/L (ref 98–111)
Creatinine, Ser: 1.17 mg/dL (ref 0.61–1.24)
GFR, Estimated: >60 mL/min (ref >60)
Glucose, Bld: 85 mg/dL (ref 70–99)
Potassium: 3.5 mmol/L (ref 3.5–5.1)
Sodium: 137 mmol/L (ref 135–145)

## 2020-11-06 LAB — CBC WITH DIFFERENTIAL/PLATELET
Abs Immature Granulocytes: 0.06 10*3/uL (ref 0.00–0.07)
Basophils Absolute: 0 10*3/uL (ref 0.0–0.1)
Basophils Relative: 0 %
Eosinophils Absolute: 0 10*3/uL (ref 0.0–0.5)
Eosinophils Relative: 0 %
HCT: 47.9 % (ref 39.0–52.0)
Hemoglobin: 16.2 g/dL (ref 13.0–17.0)
Immature Granulocytes: 0 %
Lymphocytes Relative: 11 %
Lymphs Abs: 1.6 10*3/uL (ref 0.7–4.0)
MCH: 30.9 pg (ref 26.0–34.0)
MCHC: 33.8 g/dL (ref 30.0–36.0)
MCV: 91.2 fL (ref 80.0–100.0)
Monocytes Absolute: 0.8 10*3/uL (ref 0.1–1.0)
Monocytes Relative: 5 %
Neutro Abs: 11.9 10*3/uL — ABNORMAL HIGH (ref 1.7–7.7)
Neutrophils Relative %: 84 %
Platelets: 282 10*3/uL (ref 150–400)
RBC: 5.25 MIL/uL (ref 4.22–5.81)
RDW: 13.2 % (ref 11.5–15.5)
WBC: 14.4 10*3/uL — ABNORMAL HIGH (ref 4.0–10.5)
nRBC: 0 % (ref 0.0–0.2)

## 2020-11-06 LAB — CBC
HCT: 43.8 % (ref 39.0–52.0)
Hemoglobin: 15 g/dL (ref 13.0–17.0)
MCH: 31.2 pg (ref 26.0–34.0)
MCHC: 34.2 g/dL (ref 30.0–36.0)
MCV: 91.1 fL (ref 80.0–100.0)
Platelets: 221 10*3/uL (ref 150–400)
RBC: 4.81 MIL/uL (ref 4.22–5.81)
RDW: 13.3 % (ref 11.5–15.5)
WBC: 12.4 10*3/uL — ABNORMAL HIGH (ref 4.0–10.5)
nRBC: 0 % (ref 0.0–0.2)

## 2020-11-06 LAB — TROPONIN I (HIGH SENSITIVITY)
Troponin I (High Sensitivity): 28 ng/L — ABNORMAL HIGH (ref <18)
Troponin I (High Sensitivity): 35 ng/L — ABNORMAL HIGH (ref <18)
Troponin I (High Sensitivity): 35 ng/L — ABNORMAL HIGH (ref <18)
Troponin I (High Sensitivity): 31 ng/L — ABNORMAL HIGH (ref <18)

## 2020-11-06 LAB — PHOSPHORUS: Phosphorus: 4.9 mg/dL — ABNORMAL HIGH (ref 2.5–4.6)

## 2020-11-06 LAB — CK
Total CK: 881 U/L — ABNORMAL HIGH (ref 49–397)
Total CK: 2018 U/L — ABNORMAL HIGH (ref 49–397)
Total CK: 1177 U/L — ABNORMAL HIGH (ref 49–397)

## 2020-11-06 LAB — ECHOCARDIOGRAM COMPLETE
Area-P 1/2: 4.39 cm2
Height: 72 in
S' Lateral: 3.6 cm
Weight: 3184 oz

## 2020-11-06 LAB — MAGNESIUM: Magnesium: 2.6 mg/dL — ABNORMAL HIGH (ref 1.7–2.4)

## 2020-11-06 MED ORDER — IOHEXOL 350 MG/ML SOLN
69.0000 mL | Freq: Once | INTRAVENOUS | Status: AC | PRN
  Administered 2020-11-06: 69 mL via INTRAVENOUS

## 2020-11-06 MED ORDER — ENOXAPARIN SODIUM 40 MG/0.4ML IJ SOSY
40.0000 mg | PREFILLED_SYRINGE | INTRAMUSCULAR | Status: DC
  Filled 2020-11-06 (×2): qty 0.4

## 2020-11-06 MED ORDER — ONDANSETRON HCL 4 MG/2ML IJ SOLN: 4.0000 mg | Freq: Four times a day (QID) | INTRAMUSCULAR | Status: DC | PRN

## 2020-11-06 MED ORDER — ACETAMINOPHEN 325 MG PO TABS
650.0000 mg | ORAL_TABLET | Freq: Four times a day (QID) | ORAL | Status: DC | PRN
  Filled 2020-11-06: qty 2

## 2020-11-06 MED ORDER — SODIUM CHLORIDE 0.9 % IV SOLN: INTRAVENOUS | Status: DC

## 2020-11-06 MED ORDER — POLYETHYLENE GLYCOL 3350 17 G PO PACK: 17.0000 g | PACK | Freq: Every day | ORAL | Status: DC | PRN

## 2020-11-06 MED ORDER — SODIUM CHLORIDE 0.9 % IV SOLN: Freq: Once | INTRAVENOUS | Status: AC

## 2020-11-06 MED ORDER — SODIUM CHLORIDE 0.9 % IV BOLUS
1000.0000 mL | Freq: Once | INTRAVENOUS | Status: AC
  Administered 2020-11-06: 1000 mL via INTRAVENOUS

## 2020-11-06 MED ORDER — MELATONIN 3 MG PO TABS: 3.0000 mg | ORAL_TABLET | Freq: Every evening | ORAL | Status: DC | PRN

## 2020-11-06 MED ORDER — LACTATED RINGERS IV SOLN: INTRAVENOUS | Status: DC

## 2020-11-06 NOTE — Progress Notes (Signed)
  INTERVAL PROGRESS NOTE  Srihari Shellhammer- 35 y.o. male  LOS: 0 __________________________________________________________________  SUBJECTIVE: Admitted 11/05/2020 with cc of  Chief Complaint  Patient presents with   Loss of Consciousness   Since admission, patient has remained clinically stable. He has no complaints, no reoccurrence of syncope, and has no pain. He has a lot of questions on why he is here which I attempted to explain. He denies any substance use or tobacco.   OBJECTIVE: Blood pressure (!) 103/58, pulse 69, temperature 98.8 F (37.1 C), resp. rate 18, height 6' (1.829 m), weight 90.3 kg, SpO2 98 %.  General: NAD, pleasant, able to participate in exam Cardiac: RRR, normal heart sounds, no murmurs. 2+ radial and PT pulses bilaterally Respiratory: CTAB, normal effort, No wheezes, rales or rhonchi Abdomen: soft, nontender, nondistended, no hepatic or splenomegaly Extremities: no edema. WWP. Skin: warm and dry, no rashes noted Neuro: alert and oriented, no focal deficits Psych: anxious affect and mood  ASSESSMENT/PLAN:  I have reviewed the full H&P by Dr. Margo Aye, and I agree with the assessment and plan as outlined therein. In addition: - I have increased IV fluids to maintenance rate in setting of increasing CK. - I have also added on a drug of abuse screen for contribution to findings.  - orthostatic vital signs am - CK 1700 and am - loop recorder on discharge, will contact EP   Active Problems:   Syncope  Leeroy Bock, DO [facility] 11/06/2020, 9:08 AM    [email or web address] Available by Epic secure chat 7AM-7PM. If 7PM-7AM, please contact night-coverage   No Charge

## 2020-11-06 NOTE — ED Notes (Signed)
Pt refusing to be stuck for additional labs. Fluids paused for morning labs to be pulled from IV line

## 2020-11-06 NOTE — ED Provider Notes (Signed)
[facility] EMERGENCY DEPARTMENT Provider Note   CSN: [id number] Arrival date & time: 11/05/20  2151     History Chief Complaint  Patient presents with   Loss of Consciousness    Danny Mccoy is a 35 y.o. male.  35 yo M with a chief complaints of general fatigue and malaise.  Patient was unable to sleep last night.  Feels like he has been very stressed out.  Was at work today and was feeling unwell.  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  He still feels somewhat unwell.  Has not really been eating and drinking for the past couple days.  Denies cough congestion or fever.  Has had some mild nausea and difficulty breathing and an aching pain in his chest.  Struck his head when he passed out.  The history is provided by the patient.  Loss of Consciousness Episode history:  Single Most recent episode:  Today Duration:  2 minutes Timing:  Rare Progression:  Resolved Chronicity:  New Witnessed: yes   Relieved by:  Certain positions Worsened by:  Nothing Ineffective treatments:  None tried Associated symptoms: headaches and nausea   Associated symptoms: no chest pain, no confusion, no fever, no palpitations, no shortness of breath and no vomiting       No past medical history on file.  Patient Active Problem List   Diagnosis Date Noted   Syncope 11/06/2020   CONSTIPATION 06/30/2007    No past surgical history on file.     Family History  Problem Relation Age of Onset   Healthy Mother    Healthy Father     Social History   Tobacco Use   Smoking status: Former    Types: Cigarettes, Cigars    Quit date: 01/10/2019    Years since quitting: 1.8   Smokeless tobacco: Never   Tobacco comments:    black and mild 2x/week  Vaping Use   Vaping Use: Never used  Substance Use Topics   Alcohol use: Yes    Comment: occ   Drug use: No    Home Medications Prior to Admission medications    Medication Sig Start Date End Date Taking? Authorizing Provider  cyclobenzaprine (FLEXERIL) 10 MG tablet Take 1 tablet by mouth 3 times daily as needed for muscle spasm. Warning: May cause drowsiness. 06/12/20   Mardella Layman, MD  doxycycline (VIBRAMYCIN) 100 MG capsule Take 1 capsule (100 mg total) by mouth 2 (two) times daily. 07/12/20   Elpidio Anis, PA-C  ibuprofen (ADVIL) 800 MG tablet Take 1 tablet (800 mg total) by mouth 3 (three) times daily with meals. 06/12/20   Mardella Layman, MD  loperamide (IMODIUM) 2 MG capsule Take 1 capsule (2 mg total) by mouth 2 (two) times daily as needed for diarrhea or loose stools. 05/06/20   Wallis Bamberg, PA-C  ondansetron (ZOFRAN-ODT) 8 MG disintegrating tablet Take 1 tablet (8 mg total) by mouth every 8 (eight) hours as needed for nausea or vomiting. 05/06/20   Wallis Bamberg, PA-C  fluticasone [facility]) 50 MCG/ACT nasal spray Place 1 spray into both nostrils daily. Patient not taking: Reported on 08/10/2018 04/12/17 08/10/18  Georgetta Haber, NP    Allergies    Patient has no known allergies.  Review of Systems   Review of Systems  Constitutional:  Positive for fatigue. Negative for chills and fever.  HENT:  Negative for congestion and facial swelling.  Eyes:  Negative for discharge and visual disturbance.  Respiratory:  Negative for shortness of breath.   Cardiovascular:  Positive for syncope. Negative for chest pain and palpitations.  Gastrointestinal:  Positive for nausea. Negative for abdominal pain, diarrhea and vomiting.  Musculoskeletal:  Negative for arthralgias and myalgias.  Skin:  Negative for color change and rash.  Neurological:  Positive for syncope and headaches. Negative for tremors.  Psychiatric/Behavioral:  Negative for confusion and dysphoric mood.    Physical Exam Updated Vital Signs BP 102/65   Pulse 72   Temp 98.8 F (37.1 C)   Resp (!) 24   Ht 6' (1.829 m)   Wt 90.3 kg   SpO2 100%   BMI 26.99 kg/m   Physical Exam Vitals and  nursing note reviewed.  Constitutional:      Appearance: He is well-developed.  HENT:     Head: Normocephalic and atraumatic.  Eyes:     Pupils: Pupils are equal, round, and reactive to light.  Neck:     Vascular: No JVD.  Cardiovascular:     Rate and Rhythm: Normal rate and regular rhythm.     Heart sounds: No murmur heard.   No friction rub. No gallop.  Pulmonary:     Effort: No respiratory distress.     Breath sounds: No wheezing.  Abdominal:     General: There is no distension.     Tenderness: There is no abdominal tenderness. There is no guarding or rebound.  Musculoskeletal:        General: Normal range of motion.     Cervical back: Normal range of motion and neck supple.  Skin:    Coloration: Skin is not pale.     Findings: No rash.  Neurological:     Mental Status: He is alert and oriented to person, place, and time.  Psychiatric:        Behavior: Behavior normal.    ED Results / Procedures / Treatments   Labs (all labs ordered are listed, but only abnormal results are displayed) Labs Reviewed  CBC WITH DIFFERENTIAL/PLATELET - Abnormal; Notable for the following components:      Result Value   WBC 14.4 (*)    Neutro Abs 11.9 (*)    All other components within normal limits  COMPREHENSIVE METABOLIC PANEL - Abnormal; Notable for the following components:   Creatinine, Ser 1.48 (*)    Total Bilirubin 1.8 (*)    All other components within normal limits  CK - Abnormal; Notable for the following components:   Total CK 881 (*)    All other components within normal limits  URINALYSIS, ROUTINE W REFLEX MICROSCOPIC - Abnormal; Notable for the following components:   Color, Urine STRAW (*)    APPearance TURBID (*)    Hgb urine dipstick SMALL (*)    All other components within normal limits  CBC - Abnormal; Notable for the following components:   WBC 12.4 (*)    All other components within normal limits  TROPONIN I (HIGH SENSITIVITY) - Abnormal; Notable for the  following components:   Troponin I (High Sensitivity) 28 (*)    All other components within normal limits  TROPONIN I (HIGH SENSITIVITY) - Abnormal; Notable for the following components:   Troponin I (High Sensitivity) 35 (*)    All other components within normal limits  RESP PANEL BY RT-PCR (FLU A&B, COVID) ARPGX2  CK  BASIC METABOLIC PANEL  MAGNESIUM  PHOSPHORUS  CBG MONITORING, ED  TROPONIN I (  HIGH SENSITIVITY)  TROPONIN I (HIGH SENSITIVITY)    EKG EKG Interpretation  Date/Time:  Tuesday November 05 2020 22:12:06 EDT Ventricular Rate:  92 PR Interval:  168 QRS Duration: 100 QT Interval:  350 QTC Calculation: 432 R Axis:   -34 Text Interpretation: Normal sinus rhythm Left axis deviation Abnormal ECG st changes inferior and laterally seen on prior No significant change since last tracing Confirmed by Melene Plan [phone]) on 11/06/2020 1:54:05 AM  Radiology CT HEAD WO CONTRAST ( )  Result Date: 11/05/2020 CLINICAL DATA:  Recurrent syncope, fall EXAM: CT HEAD WITHOUT CONTRAST TECHNIQUE: Contiguous axial images were obtained from the base of the skull through the vertex without intravenous contrast. COMPARISON:  09/10/2004 FINDINGS: Brain: No evidence of acute infarction, hemorrhage, hydrocephalus, extra-axial collection or mass lesion/mass effect. Vascular: No hyperdense vessel or unexpected calcification. Skull: Normal. Negative for fracture or focal lesion. Sinuses/Orbits: The visualized paranasal sinuses are essentially clear. The mastoid air cells are unopacified. Other: None. IMPRESSION: Normal head CT. Electronically Signed   By: Charline Bills M.D.   On: 11/05/2020 23:53   CT Angio Chest PE W and/or Wo Contrast  Result Date: 11/06/2020 CLINICAL DATA:  Syncope EXAM: CT ANGIOGRAPHY CHEST WITH CONTRAST TECHNIQUE: Multidetector CT imaging of the chest was performed using the standard protocol during bolus administration of intravenous contrast. Multiplanar CT image  reconstructions and MIPs were obtained to evaluate the vascular anatomy. CONTRAST:  53mL OMNIPAQUE IOHEXOL 350 MG/ML SOLN COMPARISON:  Chest radiograph dated 09/02/2020 FINDINGS: Cardiovascular: Satisfactory opacification of the bilateral pulmonary arteries to the lobar level. No evidence of pulmonary embolism. Study is not tailored for evaluation of the thoracic aorta. No evidence of thoracic aortic aneurysm. The heart is normal in size.  No pericardial effusion. Mediastinum/Nodes: No suspicious mediastinal lymphadenopathy. Visualized thyroid is unremarkable. Lungs/Pleura: Lungs are clear. No suspicious pulmonary nodules. No focal consolidation. No pleural effusion or pneumothorax. Upper Abdomen: Visualized upper abdomen is grossly unremarkable. Musculoskeletal: Visualized osseous structures are within normal limits. Review of the MIP images confirms the above findings. IMPRESSION: No evidence of pulmonary embolism. Negative CT chest. Electronically Signed   By: Charline Bills M.D.   On: 11/06/2020 03:43   DG CHEST PORT 1 VIEW  Result Date: 11/06/2020 CLINICAL DATA:  35 year old male with history of syncopal episode. EXAM: PORTABLE CHEST 1 VIEW COMPARISON:  Chest x-ray 09/02/2020. FINDINGS: Lung volumes are normal. No consolidative airspace disease. No pleural effusions. No pneumothorax. No pulmonary nodule or mass noted. Pulmonary vasculature and the cardiomediastinal silhouette are within normal limits. IMPRESSION: No radiographic evidence of acute cardiopulmonary disease. Electronically Signed   By: Trudie Reed M.D.   On: 11/06/2020 05:11    Procedures Procedures   Medications Ordered in ED Medications  enoxaparin (LOVENOX) injection 40 mg (has no administration in time range)  lactated ringers infusion (has no administration in time range)  ondansetron (ZOFRAN) injection 4 mg (has no administration in time range)  acetaminophen (TYLENOL) tablet 650 mg (has no administration in time range)   melatonin tablet 3 mg (has no administration in time range)  polyethylene glycol (MIRALAX / GLYCOLAX) packet 17 g (has no administration in time range)  0.9 %  sodium chloride infusion (0 mLs Intravenous Stopped 11/06/20 0320)  iohexol (OMNIPAQUE) 350 MG/ML injection 69 mL (69 mLs Intravenous Contrast Given 11/06/20 0320)  sodium chloride 0.9 % bolus 1,000 mL (0 mLs Intravenous Stopped 11/06/20 0528)    ED Course  I have reviewed the triage vital signs and the nursing notes.  Pertinent labs & imaging results that were available during my care of the patient were reviewed by me and considered in my medical decision making (see chart for details).    MDM Rules/Calculators/A&P                           35 yo M with a cc of fatigue.  Passed out today while working at his job.  Not exertional.  Not eating and drinking.  Most likely hypovolemic give fluids here.  Trop trending up.  AKI. Mild CK elevation.   CT angiogram of the chest is negative for pulmonary embolism.  Discussed with medicine for admission and serial troponins.  CRITICAL CARE Performed by: Rae Roam   Total critical care time: 35 minutes  Critical care time was exclusive of separately billable procedures and treating other patients.  Critical care was necessary to treat or prevent imminent or life-threatening deterioration.  Critical care was time spent personally by me on the following activities: development of treatment plan with patient and/or surrogate as well as nursing, discussions with consultants, evaluation of patient's response to treatment, examination of patient, obtaining history from patient or surrogate, ordering and performing treatments and interventions, ordering and review of laboratory studies, ordering and review of radiographic studies, pulse oximetry and re-evaluation of patient's condition.  The patients results and plan were reviewed and discussed.   Any x-rays performed were  independently reviewed by myself.   Differential diagnosis were considered with the presenting HPI.  Medications  enoxaparin (LOVENOX) injection 40 mg (has no administration in time range)  lactated ringers infusion (has no administration in time range)  ondansetron (ZOFRAN) injection 4 mg (has no administration in time range)  acetaminophen (TYLENOL) tablet 650 mg (has no administration in time range)  melatonin tablet 3 mg (has no administration in time range)  polyethylene glycol (MIRALAX / GLYCOLAX) packet 17 g (has no administration in time range)  0.9 %  sodium chloride infusion (0 mLs Intravenous Stopped 11/06/20 0320)  iohexol (OMNIPAQUE) 350 MG/ML injection 69 mL (69 mLs Intravenous Contrast Given 11/06/20 0320)  sodium chloride 0.9 % bolus 1,000 mL (0 mLs Intravenous Stopped 11/06/20 0528)    Vitals:   11/06/20 0330 11/06/20 0400 11/06/20 0430 11/06/20 0500  BP: 112/69 120/81 117/83 102/65  Pulse: 66 63 66 72  Resp: 14 15 12  (!) 24  Temp:      SpO2: 98%  98% 100%  Weight:      Height:        Final diagnoses:  AKI (acute kidney injury) (HCC)  Troponin level elevated    Admission/ observation were discussed with the admitting physician, patient and/or family and they are comfortable with the plan.    Final Clinical Impression(s) / ED Diagnoses Final diagnoses:  AKI (acute kidney injury) (HCC)  Troponin level elevated    Rx / DC Orders ED Discharge Orders     None        , DO 11/06/20 11/08/20

## 2020-11-06 NOTE — Progress Notes (Signed)
  Echocardiogram 2D Echocardiogram has been performed.  Danny Mccoy 11/06/2020, 5:33 PM

## 2020-11-06 NOTE — H&P (Signed)
History and Physical  Danny Mccoy DXI:[id number] DOB: January 08, 1986 DOA: 11/05/2020  Referring physician: Dr. Adela Lank, EDP. PCP: Patient, No Pcp Per (Inactive)  Outpatient Specialists: None. Patient coming from: Home via EMS, picked up a work.  Chief Complaint: Passing out  HPI: Danny Mccoy is a 35 y.o. male with medical history significant for syncope in childhood, COVID-19 viral infection (10/15/2019)who presents to [facility] ED via EMS after an episode of syncope at work.  He struck his head in the fall.  Associated with lightheadedness prior to his fall, which persisted in the ED, high level of stress, lack of sleep, poor oral intake.  While in the ED his troponin was elevated 28, repeat 35.  He denies any chest pain or dyspnea at the time of this visit.  Creatinine bumped 1.48 from 0.93 at baseline(09/02/2020).  He received 2 L of IV fluid bolus normal saline in the ED.  CT head was nonacute.  CTA chest was negative for PE.  EDP requested admission due to concern for AKI, dehydration, elevated troponin.  Patient admitted by hospitalist service.  ED Course: Temperature 98.8.  BP 120/81, pulse 63, respiration rate 15, O2 saturation 98% on room air.  Lab studies remarkable for WBC 14.4, neutrophil count 11.9, BUN 14, creatinine 1.48 from baseline of 0.93, troponin 28, repeat 35, CPK 881.  Total bilirubin 1.8.  Review of Systems: Review of systems as noted in the HPI. All other systems reviewed and are negative.  Past medical history: COVID-19 viral infection on 10/15/2019.  Past surgical history: None reported.  Social History:  reports that he quit smoking about 21 months ago. His smoking use included cigarettes and cigars. He has never used smokeless tobacco. He reports current alcohol use. He reports that he does not use drugs.   No Known Allergies  Family History  Problem Relation Age of Onset   Healthy Mother    Healthy Father   Father with history of diabetes.   Prior to Admission  medications   Medication Sig Start Date End Date Taking? Authorizing Provider  cyclobenzaprine (FLEXERIL) 10 MG tablet Take 1 tablet by mouth 3 times daily as needed for muscle spasm. Warning: May cause drowsiness. 06/12/20   Mardella Layman, MD  doxycycline (VIBRAMYCIN) 100 MG capsule Take 1 capsule (100 mg total) by mouth 2 (two) times daily. 07/12/20   Elpidio Anis, PA-C  ibuprofen (ADVIL) 800 MG tablet Take 1 tablet (800 mg total) by mouth 3 (three) times daily with meals. 06/12/20   Mardella Layman, MD  loperamide (IMODIUM) 2 MG capsule Take 1 capsule (2 mg total) by mouth 2 (two) times daily as needed for diarrhea or loose stools. 05/06/20   Wallis Bamberg, PA-C  ondansetron (ZOFRAN-ODT) 8 MG disintegrating tablet Take 1 tablet (8 mg total) by mouth every 8 (eight) hours as needed for nausea or vomiting. 05/06/20   Wallis Bamberg, PA-C  fluticasone [facility]) 50 MCG/ACT nasal spray Place 1 spray into both nostrils daily. Patient not taking: Reported on 08/10/2018 04/12/17 08/10/18  Georgetta Haber, NP    Physical Exam: BP 120/81   Pulse 63   Temp 98.8 F (37.1 C)   Resp 15   Ht 6' (1.829 m)   Wt 90.3 kg   SpO2 98%   BMI 26.99 kg/m   General: 35 y.o. year-old male well developed well nourished in no acute distress.  Alert and oriented x3. Cardiovascular: Regular rate and rhythm with no rubs or gallops.  No thyromegaly or JVD noted.  No lower extremity edema. 2/4 pulses in all 4 extremities. Respiratory: Clear to auscultation with no wheezes or rales. Good inspiratory effort. Abdomen: Soft nontender nondistended with normal bowel sounds x4 quadrants. Muskuloskeletal: No cyanosis, clubbing or edema noted bilaterally Neuro: CN II-XII intact, strength, sensation, reflexes Skin: No ulcerative lesions noted or rashes Psychiatry: Judgement and insight appear normal. Mood is appropriate for condition and setting          Labs on Admission:  Basic Metabolic Panel: Recent Labs  Lab 11/05/20 2248  NA 137   K 3.9  CL 102  CO2 26  GLUCOSE 82  BUN 14  CREATININE 1.48*  CALCIUM 9.5   Liver Function Tests: Recent Labs  Lab 11/05/20 2248  AST 39  ALT 34  ALKPHOS 62  BILITOT 1.8*  PROT 7.8  ALBUMIN 4.7   No results for input(s): LIPASE, AMYLASE in the last 168 hours. No results for input(s): AMMONIA in the last 168 hours. CBC: Recent Labs  Lab 11/05/20 2248  WBC 14.4*  NEUTROABS 11.9*  HGB 16.2  HCT 47.9  MCV 91.2  PLT 282   Cardiac Enzymes: Recent Labs  Lab 11/05/20 2248  CKTOTAL 881*    BNP (last 3 results) No results for input(s): BNP in the last 8760 hours.  ProBNP (last 3 results) No results for input(s): PROBNP in the last 8760 hours.  CBG: No results for input(s): GLUCAP in the last 168 hours.  Radiological Exams on Admission: CT HEAD WO CONTRAST ( )  Result Date: 11/05/2020 CLINICAL DATA:  Recurrent syncope, fall EXAM: CT HEAD WITHOUT CONTRAST TECHNIQUE: Contiguous axial images were obtained from the base of the skull through the vertex without intravenous contrast. COMPARISON:  09/10/2004 FINDINGS: Brain: No evidence of acute infarction, hemorrhage, hydrocephalus, extra-axial collection or mass lesion/mass effect. Vascular: No hyperdense vessel or unexpected calcification. Skull: Normal. Negative for fracture or focal lesion. Sinuses/Orbits: The visualized paranasal sinuses are essentially clear. The mastoid air cells are unopacified. Other: None. IMPRESSION: Normal head CT. Electronically Signed   By: Charline Bills M.D.   On: 11/05/2020 23:53   CT Angio Chest PE W and/or Wo Contrast  Result Date: 11/06/2020 CLINICAL DATA:  Syncope EXAM: CT ANGIOGRAPHY CHEST WITH CONTRAST TECHNIQUE: Multidetector CT imaging of the chest was performed using the standard protocol during bolus administration of intravenous contrast. Multiplanar CT image reconstructions and MIPs were obtained to evaluate the vascular anatomy. CONTRAST:  65mL OMNIPAQUE IOHEXOL 350 MG/ML SOLN  COMPARISON:  Chest radiograph dated 09/02/2020 FINDINGS: Cardiovascular: Satisfactory opacification of the bilateral pulmonary arteries to the lobar level. No evidence of pulmonary embolism. Study is not tailored for evaluation of the thoracic aorta. No evidence of thoracic aortic aneurysm. The heart is normal in size.  No pericardial effusion. Mediastinum/Nodes: No suspicious mediastinal lymphadenopathy. Visualized thyroid is unremarkable. Lungs/Pleura: Lungs are clear. No suspicious pulmonary nodules. No focal consolidation. No pleural effusion or pneumothorax. Upper Abdomen: Visualized upper abdomen is grossly unremarkable. Musculoskeletal: Visualized osseous structures are within normal limits. Review of the MIP images confirms the above findings. IMPRESSION: No evidence of pulmonary embolism. Negative CT chest. Electronically Signed   By: Charline Bills M.D.   On: 11/06/2020 03:43    EKG: I independently viewed the EKG done and my findings are as followed: Sinus rhythm rate of 92, nonspecific ST-T changes.  QTc 432.  Assessment/Plan Present on Admission:  Syncope  Active Problems:   Syncope  Syncope, unclear etiology Possibly secondary to hypovolemia Obtain orthostatic vital signs 2D echo  pending Hydrate, he received 2 L IV fluid boluses normal saline in the ED Continue LR at 100 cc/h x 2 days PT/OT assessment Fall precautions  Elevated troponin, suspect demand ischemia Initial troponin 25, up rising to 38 Cycle troponin x2 Repeat twelve-lead EKG in the a.m. Follow 2D echo  AKI, suspect prerenal in the setting of dehydration from poor oral intake Baseline creatinine appears to be 0.9 with GFR greater than 60 Presented with creatinine of 1.48 Continue IV fluid hydration and monitor urine output Avoid nephrotoxic agents, dehydration and hypotension. Repeat BMP in the morning.  Leukocytosis, suspect reactive in the setting of syncope No evidence of pyuria on UA Obtain a chest  x-ray Repeat CBC in the morning  Mild rhabdomyolysis CPK 881 Continue IV fluid hydration Repeat CPK in the morning.  Elevated T bilirubin, isolated Presented with T bilirubin 1.8 Monitor for now   DVT prophylaxis: Subcu Lovenox daily  Code Status: Full code  Family Communication: None at bedside  Disposition Plan: Admitted to telemetry cardiac.  Consults called: None  Admission status: Observation status.   Status is: Observation    Dispo: The patient is from: Home              Anticipated d/c is to: Home              Patient currently not medically stable for discharge.   Difficult to place patient, not applicable.       Darlin Drop MD [facility] Pager [phone]  If 7PM-7AM, please contact night-coverage [email or web address] Password [email or web address]  11/06/2020, 4:21 AM

## 2020-11-06 NOTE — Progress Notes (Signed)
OT Cancellation Note  Patient Details Name: Danny Mccoy MRN: [id number] DOB: [birth year]   Cancelled Treatment:    Reason Eval/Treat Not Completed: OT screened, no needs identified, will sign off.  Per PT, pt appears to be at baseline.  Eber Jones., OTR/L Acute Rehabilitation Services Pager [phone] Office [phone]   Jeani Hawking M 11/06/2020, 10:12 AM

## 2020-11-06 NOTE — Evaluation (Signed)
Physical Therapy Evaluation Patient Details Name: Danny Mccoy MRN: [id number] DOB: Feb 08, 1986 Today's Date: 11/06/2020  History of Present Illness  Pt is a 35 y/o male admitted secondary to syncope. Imaging negative. PMH includes COVID and syncope in childhood.  Clinical Impression  Pt admitted secondary to problem above with deficits below. Pt reporting pain in back and LEs and mild wooziness, but orthostatics negative (see vitals flowsheet). Supervision for safety with mobility tasks within ED room. Anticipate pt will progress well and will not require follow up PT. Will continue to follow acutely.        Recommendations for follow up therapy are one component of a multi-disciplinary discharge planning process, led by the attending physician.  Recommendations may be updated based on patient status, additional functional criteria and insurance authorization.  Follow Up Recommendations No PT follow up    Equipment Recommendations  None recommended by PT    Recommendations for Other Services       Precautions / Restrictions Precautions Precautions: None Restrictions Weight Bearing Restrictions: No      Mobility  Bed Mobility Overal bed mobility: Independent                  Transfers Overall transfer level: Needs assistance Equipment used: None Transfers: Sit to/from Stand Sit to Stand: Supervision         General transfer comment: Supervision for safety. Mild wooziness reported but BP WFL  Ambulation/Gait Ambulation/Gait assistance: Supervision Gait Distance (Feet): 10 Feet Assistive device: None Gait Pattern/deviations: Step-through pattern;Decreased stride length Gait velocity: Decreased   General Gait Details: Guarded gait, but no LOB noted. supervision for safety. Discussed walking program to help with pain management.  Stairs            Wheelchair Mobility    Modified Rankin (Stroke Patients Only)       Balance Overall balance  assessment: Mild deficits observed, not formally tested                                           Pertinent Vitals/Pain Pain Assessment: Faces Faces Pain Scale: Hurts little more Pain Location: back and legs Pain Descriptors / Indicators: Grimacing;Guarding Pain Intervention(s): Limited activity within patient's tolerance;Monitored during session;Repositioned    Home Living Family/patient expects to be discharged to:: Private residence Living Arrangements: Parent Available Help at Discharge: Family Type of Home: House Home Access: Level entry     Home Layout: One level Home Equipment: None      Prior Function Level of Independence: Independent               Hand Dominance        Extremity/Trunk Assessment   Upper Extremity Assessment Upper Extremity Assessment: Overall WFL for tasks assessed    Lower Extremity Assessment Lower Extremity Assessment: Overall WFL for tasks assessed    Cervical / Trunk Assessment Cervical / Trunk Assessment: Normal  Communication   Communication: No difficulties  Cognition Arousal/Alertness: Awake/alert Behavior During Therapy: Flat affect Overall Cognitive Status: No family/caregiver present to determine baseline cognitive functioning                                 General Comments: Pt very flat throughout but likely baseline      General Comments      Exercises  Assessment/Plan    PT Assessment Patient needs continued PT services  PT Problem List Decreased activity tolerance;Decreased mobility;Pain       PT Treatment Interventions Gait training;Functional mobility training;Therapeutic activities;Therapeutic exercise;Patient/family education    PT Goals (Current goals can be found in the Care Plan section)  Acute Rehab PT Goals Patient Stated Goal: to go home PT Goal Formulation: With patient Time For Goal Achievement: 11/20/20 Potential to Achieve Goals: Good     Frequency Min 3X/week   Barriers to discharge        Co-evaluation               AM-PAC PT "6 Clicks" Mobility  Outcome Measure Help needed turning from your back to your side while in a flat bed without using bedrails?: None Help needed moving from lying on your back to sitting on the side of a flat bed without using bedrails?: None Help needed moving to and from a bed to a chair (including a wheelchair)?: A Little Help needed standing up from a chair using your arms (e.g., wheelchair or bedside chair)?: A Little Help needed to walk in hospital room?: A Little Help needed climbing 3-5 steps with a railing? : A Little 6 Click Score: 20    End of Session   Activity Tolerance: Patient tolerated treatment well Patient left: in bed;with call bell/phone within reach (on stretcher in ED) Nurse Communication: Mobility status PT Visit Diagnosis: Other abnormalities of gait and mobility (R26.89);Pain Pain - part of body:  (back and BLE)    Time: 5732-2025 PT Time Calculation (min) (ACUTE ONLY): 18 min   Charges:   PT Evaluation $PT Eval Low Complexity: 1 Low          Cindee Salt, DPT  Acute Rehabilitation Services  Pager: [phone] Office: [phone]   Danny Mccoy 11/06/2020, 9:32 AM

## 2020-11-07 DIAGNOSIS — R55 Syncope and collapse: Secondary | ICD-10-CM | POA: Diagnosis not present

## 2020-11-07 DIAGNOSIS — N179 Acute kidney failure, unspecified: Secondary | ICD-10-CM

## 2020-11-07 LAB — CBC
HCT: 40 % (ref 39.0–52.0)
Hemoglobin: 13.8 g/dL (ref 13.0–17.0)
MCH: 31.2 pg (ref 26.0–34.0)
MCHC: 34.5 g/dL (ref 30.0–36.0)
MCV: 90.5 fL (ref 80.0–100.0)
Platelets: 191 10*3/uL (ref 150–400)
RBC: 4.42 MIL/uL (ref 4.22–5.81)
RDW: 13.2 % (ref 11.5–15.5)
WBC: 7.2 10*3/uL (ref 4.0–10.5)
nRBC: 0 % (ref 0.0–0.2)

## 2020-11-07 LAB — BASIC METABOLIC PANEL
Anion gap: 6 (ref 5–15)
BUN: 10 mg/dL (ref 6–20)
CO2: 25 mmol/L (ref 22–32)
Calcium: 8.2 mg/dL — ABNORMAL LOW (ref 8.9–10.3)
Chloride: 106 mmol/L (ref 98–111)
Creatinine, Ser: 1.09 mg/dL (ref 0.61–1.24)
GFR, Estimated: >60 mL/min (ref >60)
Glucose, Bld: 122 mg/dL — ABNORMAL HIGH (ref 70–99)
Potassium: 3.5 mmol/L (ref 3.5–5.1)
Sodium: 137 mmol/L (ref 135–145)

## 2020-11-07 LAB — CK: Total CK: 1541 U/L — ABNORMAL HIGH (ref 49–397)

## 2020-11-07 NOTE — Discharge Summary (Signed)
Physician Discharge Summary  Danny Mccoy QQP:[id number] DOB: May 30, 1985 DOA: 11/05/2020  PCP: Patient, No Pcp Per (Inactive)  Admit date: 11/05/2020 Discharge date: 11/07/2020  Admitted From: home Disposition:  home  Recommendations for Outpatient Follow-up:  Follow up with PCP within 1-2 weeks Follow up with cardiology to review loop recorder results  Discharge Condition:stable CODE STATUS:full Diet recommendation: regular  Brief/Interim Summary: Pt admitted after syncopal episode at work. Initial workup was concerning for signs of orthostatics positive, and mild rhabdomyolysis. ACS was ruled out with negative ECG and troponins mildly elevated likely 2/2 demand ischemia according to cardiology consult in ED. Patient was admitted for IV hydration and treatment of AKI. On day of discharge, his AKI had resolved and orthostatics were no longer positive. Patient was asymptomatic and was discharged with loop recorder to follow up with cardiology.   Discharge Diagnoses:  Active Problems:   Syncope   AKI (acute kidney injury) (HCC)  Allergies as of 11/07/2020   No Known Allergies      Medication List     STOP taking these medications    cyclobenzaprine 10 MG tablet Commonly known as: FLEXERIL   doxycycline 100 MG capsule Commonly known as: VIBRAMYCIN   ibuprofen 800 MG tablet Commonly known as: ADVIL   loperamide 2 MG capsule Commonly known as: IMODIUM   ondansetron 8 MG disintegrating tablet Commonly known as: ZOFRAN-ODT        No Known Allergies  Consultations: cardiology  Procedures/Studies: CT HEAD WO CONTRAST ( )  Result Date: 11/05/2020 CLINICAL DATA:  Recurrent syncope, fall EXAM: CT HEAD WITHOUT CONTRAST TECHNIQUE: Contiguous axial images were obtained from the base of the skull through the vertex without intravenous contrast. COMPARISON:  09/10/2004 FINDINGS: Brain: No evidence of acute infarction, hemorrhage, hydrocephalus, extra-axial collection or  mass lesion/mass effect. Vascular: No hyperdense vessel or unexpected calcification. Skull: Normal. Negative for fracture or focal lesion. Sinuses/Orbits: The visualized paranasal sinuses are essentially clear. The mastoid air cells are unopacified. Other: None. IMPRESSION: Normal head CT. Electronically Signed   By: Charline Bills M.D.   On: 11/05/2020 23:53   CT Angio Chest PE W and/or Wo Contrast  Result Date: 11/06/2020 CLINICAL DATA:  Syncope EXAM: CT ANGIOGRAPHY CHEST WITH CONTRAST TECHNIQUE: Multidetector CT imaging of the chest was performed using the standard protocol during bolus administration of intravenous contrast. Multiplanar CT image reconstructions and MIPs were obtained to evaluate the vascular anatomy. CONTRAST:  60mL OMNIPAQUE IOHEXOL 350 MG/ML SOLN COMPARISON:  Chest radiograph dated 09/02/2020 FINDINGS: Cardiovascular: Satisfactory opacification of the bilateral pulmonary arteries to the lobar level. No evidence of pulmonary embolism. Study is not tailored for evaluation of the thoracic aorta. No evidence of thoracic aortic aneurysm. The heart is normal in size.  No pericardial effusion. Mediastinum/Nodes: No suspicious mediastinal lymphadenopathy. Visualized thyroid is unremarkable. Lungs/Pleura: Lungs are clear. No suspicious pulmonary nodules. No focal consolidation. No pleural effusion or pneumothorax. Upper Abdomen: Visualized upper abdomen is grossly unremarkable. Musculoskeletal: Visualized osseous structures are within normal limits. Review of the MIP images confirms the above findings. IMPRESSION: No evidence of pulmonary embolism. Negative CT chest. Electronically Signed   By: Charline Bills M.D.   On: 11/06/2020 03:43   DG CHEST PORT 1 VIEW  Result Date: 11/06/2020 CLINICAL DATA:  35 year old male with history of syncopal episode. EXAM: PORTABLE CHEST 1 VIEW COMPARISON:  Chest x-ray 09/02/2020. FINDINGS: Lung volumes are normal. No consolidative airspace disease. No  pleural effusions. No pneumothorax. No pulmonary nodule or mass noted. Pulmonary vasculature and  the cardiomediastinal silhouette are within normal limits. IMPRESSION: No radiographic evidence of acute cardiopulmonary disease. Electronically Signed   By: Trudie Reed M.D.   On: 11/06/2020 05:11   ECHOCARDIOGRAM COMPLETE  Result Date: 11/06/2020    ECHOCARDIOGRAM REPORT   Patient Name:   Danny Mccoy Date of Exam: 11/06/2020 Medical Rec #:  [id number]       Height:       72.0 in Accession #:    [id number]      Weight:       199.0 lb Date of Birth:  1985/11/24       BSA:          2.126 m Patient Age:    35 years        BP:           138/87 mmHg Patient Gender: M               HR:           57 bpm. Exam Location:  Inpatient Procedure: 2D Echo, Cardiac Doppler and Color Doppler Indications:    Syncope  History:        Patient has no prior history of Echocardiogram examinations.                 Elevated troponin.  Sonographer:    Roosvelt Maser RDCS Referring Phys: [id number] CAROLE N HALL IMPRESSIONS  1. Left ventricular ejection fraction, by estimation, is 60 to 65%. The left ventricle has normal function. The left ventricle has no regional wall motion abnormalities. Left ventricular diastolic parameters were normal.  2. Right ventricular systolic function is normal. The right ventricular size is normal.  3. The mitral valve is abnormal. Mild mitral valve regurgitation. No evidence of mitral stenosis.  4. The aortic valve is tricuspid. Aortic valve regurgitation is not visualized. No aortic stenosis is present.  5. The inferior vena cava is normal in size with greater than 50% respiratory variability, suggesting right atrial pressure of 3 mmHg. FINDINGS  Left Ventricle: Left ventricular ejection fraction, by estimation, is 60 to 65%. The left ventricle has normal function. The left ventricle has no regional wall motion abnormalities. The left ventricular internal cavity size was normal in size. There is  no left  ventricular hypertrophy. Left ventricular diastolic parameters were normal. Right Ventricle: The right ventricular size is normal. No increase in right ventricular wall thickness. Right ventricular systolic function is normal. Left Atrium: Left atrial size was normal in size. Right Atrium: Right atrial size was normal in size. Pericardium: There is no evidence of pericardial effusion. Mitral Valve: The mitral valve is abnormal. There is mild thickening of the mitral valve leaflet(s). Mild mitral valve regurgitation. No evidence of mitral valve stenosis. Tricuspid Valve: The tricuspid valve is normal in structure. Tricuspid valve regurgitation is not demonstrated. No evidence of tricuspid stenosis. Aortic Valve: The aortic valve is tricuspid. Aortic valve regurgitation is not visualized. No aortic stenosis is present. Pulmonic Valve: The pulmonic valve was normal in structure. Pulmonic valve regurgitation is trivial. No evidence of pulmonic stenosis. Aorta: The aortic root is normal in size and structure. Venous: The inferior vena cava is normal in size with greater than 50% respiratory variability, suggesting right atrial pressure of 3 mmHg. IAS/Shunts: No atrial level shunt detected by color flow Doppler.  LEFT VENTRICLE PLAX 2D LVIDd:         5.20 cm  Diastology LVIDs:         3.60 cm  LV e' medial:    10.70 cm/s LV PW:         1.00 cm  LV E/e' medial:  9.4 LV IVS:        1.00 cm  LV e' lateral:   16.20 cm/s LVOT diam:     2.10 cm  LV E/e' lateral: 6.2 LVOT Area:     3.46 cm  RIGHT VENTRICLE RV Basal diam:  3.70 cm LEFT ATRIUM             Index       RIGHT ATRIUM           Index LA diam:        3.80 cm 1.79 cm/m  RA Area:     20.70 cm LA Vol (A2C):   74.4 ml 34.99 ml/m RA Volume:   68.70 ml  32.31 ml/m LA Vol (A4C):   63.2 ml 29.73 ml/m LA Biplane Vol: 68.8 ml 32.36 ml/m   AORTA Ao Root diam: 3.00 cm MITRAL VALVE MV Area (PHT): 4.39 cm     SHUNTS MV Decel Time: 173 msec     Systemic Diam: 2.10 cm MV E  velocity: 101.00 cm/s MV A velocity: 49.80 cm/s MV E/A ratio:  2.03 Charlton Haws MD Electronically signed by Charlton Haws MD Signature Date/Time: 11/06/2020/5:41:00 PM    Final     Subjective: Patient feels well today. Denies orthostatic symptoms, chest pain, repeat syncope, or shortness of breath. Ready to go home.   Discharge Exam: Vitals:   11/07/20 0436 11/07/20 0803  BP: 105/63   Pulse: 60   Resp: 17   Temp: 98.1 F (36.7 C)   SpO2: 97% 100%    General: Pt is alert, awake, not in acute distress Cardiovascular: RRR, S1/S2 +, no rubs, no gallops Respiratory: CTA bilaterally, no wheezing, no rhonchi Abdominal: Soft, NT, ND Skin: no rashes or lesions Extremities: no edema, no cyanosis  Labs: Basic Metabolic Panel: Recent Labs  Lab 11/05/20 2248 11/06/20 0500 11/07/20 0306  NA 137 137 137  K 3.9 3.5 3.5  CL 102 107 106  CO2 26 23 25   GLUCOSE 82 85 122*  BUN 14 12 10   CREATININE 1.48* 1.17 1.09  CALCIUM 9.5 8.3* 8.2*  MG  --  2.6*  --   PHOS  --  4.9*  --    CBC: Recent Labs  Lab 11/05/20 2248 11/06/20 0500 11/07/20 0306  WBC 14.4* 12.4* 7.2  NEUTROABS 11.9*  --   --   HGB 16.2 15.0 13.8  HCT 47.9 43.8 40.0  MCV 91.2 91.1 90.5  PLT 282 221 191    Microbiology Recent Results (from the past 240 hour(s))  Resp Panel by RT-PCR (Flu A&B, Covid) Nasopharyngeal Swab     Status: None   Collection Time: 11/06/20  3:01 AM   Specimen: Nasopharyngeal Swab; Nasopharyngeal(NP) swabs in vial transport medium  Result Value Ref Range Status   SARS Coronavirus 2 by RT PCR NEGATIVE NEGATIVE Final    Comment: (NOTE) SARS-CoV-2 target nucleic acids are NOT DETECTED.  The SARS-CoV-2 RNA is generally detectable in upper respiratory specimens during the acute phase of infection. The lowest concentration of SARS-CoV-2 viral copies this assay can detect is 138 copies/mL. A negative result does not preclude SARS-Cov-2 infection and should not be used as the sole basis for  treatment or other patient management decisions. A negative result may occur with  improper specimen collection/handling, submission of specimen other than nasopharyngeal swab, presence of  viral mutation(s) within the areas targeted by this assay, and inadequate number of viral copies(<138 copies/mL). A negative result must be combined with clinical observations, patient history, and epidemiological information. The expected result is Negative.  Fact Sheet for Patients:  BloggerCourse.com  Fact Sheet for Healthcare Providers:  SeriousBroker.it  This test is no t yet approved or cleared by the [place] FDA and  has been authorized for detection and/or diagnosis of SARS-CoV-2 by FDA under an Emergency Use Authorization (EUA). This EUA will remain  in effect (meaning this test can be used) for the duration of the COVID-19 declaration under Section 564(b)(1) of the Act, 21 U.S.C.section 360bbb-3(b)(1), unless the authorization is terminated  or revoked sooner.       Influenza A by PCR NEGATIVE NEGATIVE Final   Influenza B by PCR NEGATIVE NEGATIVE Final    Comment: (NOTE) The Xpert Xpress SARS-CoV-2/FLU/RSV plus assay is intended as an aid in the diagnosis of influenza from Nasopharyngeal swab specimens and should not be used as a sole basis for treatment. Nasal washings and aspirates are unacceptable for Xpert Xpress SARS-CoV-2/FLU/RSV testing.  Fact Sheet for Patients: BloggerCourse.com  Fact Sheet for Healthcare Providers: SeriousBroker.it  This test is not yet approved or cleared by the [place] FDA and has been authorized for detection and/or diagnosis of SARS-CoV-2 by FDA under an Emergency Use Authorization (EUA). This EUA will remain in effect (meaning this test can be used) for the duration of the COVID-19 declaration under Section 564(b)(1) of the Act, 21  U.S.C. section 360bbb-3(b)(1), unless the authorization is terminated or revoked.  Performed at [facility], [address], [place], [place] [address]     Time coordinating discharge: Over 30 minutes  Leeroy Bock, MD  [facility] 11/07/2020, 10:37 AM Pager   If 7PM-7AM, please contact night-coverage [email or web address] Password [email or web address]

## 2020-11-07 NOTE — Progress Notes (Signed)
Brief cardiology note: Initially consulted to evaluate for loop recorder. Reviewed by Dr. Lalla Brothers, not felt to meet criteria for loop implantation. Echo unremarkable, symptoms resolved with hydration.   Discussed with Dr. Dareen Piano. We will plan to have him seen with close follow up in outpatient cardiology clinic. Further evaluation if required will be discussed at that time.  Please call with questions.  Jodelle Red, MD, PhD, [facility] [place]  [facility]  [place] at [facility] [address], [address] [place], [place] [address] [phone]

## 2020-11-12 ENCOUNTER — Other Ambulatory Visit: Payer: Self-pay

## 2020-11-12 ENCOUNTER — Ambulatory Visit (INDEPENDENT_AMBULATORY_CARE_PROVIDER_SITE_OTHER): Payer: BC Managed Care – PPO | Admitting: Cardiology

## 2020-11-12 ENCOUNTER — Ambulatory Visit: Payer: BC Managed Care – PPO

## 2020-11-12 ENCOUNTER — Encounter (HOSPITAL_BASED_OUTPATIENT_CLINIC_OR_DEPARTMENT_OTHER): Payer: Self-pay | Admitting: *Deleted

## 2020-11-12 VITALS — BP 110/70 | HR 65 | Ht 72.0 in | Wt 204.9 lb

## 2020-11-12 DIAGNOSIS — R55 Syncope and collapse: Secondary | ICD-10-CM

## 2020-11-12 NOTE — Progress Notes (Signed)
Cardiology Office Note:    Date:  11/12/2020   ID:  Danny Mccoy, DOB 1985/12/12, MRN [id number]  PCP:  Patient, No Pcp Per (Inactive)   [facility] Providers Cardiologist:  None     Referring MD: No ref. provider found   History of Present Illness:    Danny Mccoy is a 35 y.o. male here for the evaluation of syncope at the request of Dr. Cristal Deer.  Was most recently seen in the ED 11/05/2020 following a syncopal episode at work. Initial workup was concerning for signs of orthostatics positive and mild rhabdomyolysis. ECG was negative, troponins were mildly elevated likely due to demand ischemia. He was discharged with a loop recorder, and referred to cardiology to review loop recorder results.  Today: He is accompanied by his wife. He reports that he had 2 other episodes of syncope/near-syncope similar to the episode on 9/27. While at work he felt lightheaded and had not slept the night before, but continued to push himself. He was drinking Mt. Dew and was likely dehydrated. Upon waking up he was in the ambulance.  During his youth he had a seizure following football practice, while sitting. Suddenly he was staring into space, and then became syncopal. When he regained consciousness he had bitten his tongue.  During the physical exam he thought he felt his heart racing, but his heart beat appeared normal with auscultation.  He denies any chest pain, or shortness of breath. No headaches, orthopnea, or PND. Also has no lower extremity edema.  His father and mother have diabetes. No family history of sudden cardiac death.  No past medical history on file.  No past surgical history on file.  Current Medications: No outpatient medications have been marked as taking for the 11/12/20 encounter (Office Visit) with Jake Bathe, MD.     Allergies:   Patient has no known allergies.   Social History   Socioeconomic History   Marital status: Single    Spouse  name: Not on file   Number of children: Not on file   Years of education: Not on file   Highest education level: Not on file  Occupational History   Not on file  Tobacco Use   Smoking status: Former    Types: Cigarettes, Cigars    Quit date: 01/10/2019    Years since quitting: 1.8   Smokeless tobacco: Never   Tobacco comments:    black and mild 2x/week  Vaping Use   Vaping Use: Never used  Substance and Sexual Activity   Alcohol use: Yes    Comment: occ   Drug use: No   Sexual activity: Yes    Birth control/protection: Condom  Other Topics Concern   Not on file  Social History Narrative   Not on file   Social Determinants of Health   Financial Resource Strain: Not on file  Food Insecurity: Not on file  Transportation Needs: Not on file  Physical Activity: Not on file  Stress: Not on file  Social Connections: Not on file     Family History: The patient's family history includes Healthy in his father and mother.  ROS:   Please see the history of present illness.    (+) Syncope (+) Lightheadedness All other systems reviewed and are negative.  EKGs/Labs/Other Studies Reviewed:    The following studies were reviewed today:  Echo 11/06/2020:  1. Left ventricular ejection fraction, by estimation, is 60 to 65%. The  left ventricle has normal function. The  left ventricle has no regional  wall motion abnormalities. Left ventricular diastolic parameters were  normal.   2. Right ventricular systolic function is normal. The right ventricular  size is normal.   3. The mitral valve is abnormal. Mild mitral valve regurgitation. No  evidence of mitral stenosis.   4. The aortic valve is tricuspid. Aortic valve regurgitation is not  visualized. No aortic stenosis is present.   5. The inferior vena cava is normal in size with greater than 50%  respiratory variability, suggesting right atrial pressure of 3 mmHg.   CTA Chest 11/06/2020: COMPARISON:  Chest radiograph dated  09/02/2020   FINDINGS: Cardiovascular: Satisfactory opacification of the bilateral pulmonary arteries to the lobar level. No evidence of pulmonary embolism.   Study is not tailored for evaluation of the thoracic aorta. No evidence of thoracic aortic aneurysm.   The heart is normal in size.  No pericardial effusion.   Mediastinum/Nodes: No suspicious mediastinal lymphadenopathy.   Visualized thyroid is unremarkable.   Lungs/Pleura: Lungs are clear.   No suspicious pulmonary nodules.   No focal consolidation.   No pleural effusion or pneumothorax.   Upper Abdomen: Visualized upper abdomen is grossly unremarkable.   Musculoskeletal: Visualized osseous structures are within normal limits.   Review of the MIP images confirms the above findings.   IMPRESSION: No evidence of pulmonary embolism.   Negative CT chest.  EKG:  EKG is personally reviewed and interpreted. 11/12/2020: Sinus rhythm. Rate 65 bpm. Nonspecific T wave changes.  Recent Labs: 11/05/2020: ALT 34 11/06/2020: Magnesium 2.6 11/07/2020: BUN 10; Creatinine, Ser 1.09; Hemoglobin 13.8; Platelets 191; Potassium 3.5; Sodium 137   Recent Lipid Panel No results found for: CHOL, TRIG, HDL, CHOLHDL, VLDL, LDLCALC, LDLDIRECT   Risk Assessment/Calculations:          Physical Exam:    VS:  BP 110/70   Pulse 65   Ht 6' (1.829 m)   Wt 204 lb 14.4 oz (92.9 kg)   BMI 27.79 kg/m     Wt Readings from Last 3 Encounters:  11/12/20 204 lb 14.4 oz (92.9 kg)  11/05/20 199 lb (90.3 kg)  07/12/20 199 lb 15.3 oz (90.7 kg)     GEN: Well nourished, well developed in no acute distress HEENT: Normal NECK: No JVD; No carotid bruits LYMPHATICS: No lymphadenopathy CARDIAC: RRR, no murmurs, rubs, gallops RESPIRATORY:  Clear to auscultation without rales, wheezing or rhonchi  ABDOMEN: Soft, non-tender, non-distended MUSCULOSKELETAL:  No edema; No deformity  SKIN: Warm and dry NEUROLOGIC:  Alert and oriented x  3 PSYCHIATRIC:  Normal affect   ASSESSMENT:    1. Syncope, unspecified syncope type    PLAN:    In order of problems listed above: Syncope Syncope occurred while at work.  Based upon lab values such as elevated CPK, elevated creatinine he clearly had acute kidney injury likely as a result of dehydration/decreased intravascular volume.  His EKG did not show any adverse changes, normal intervals, nonspecific T wave changes noted on ECG-normal wall thickness on echocardiogram, i.e. no phenotypic evidence of hypertrophic cardiomyopathy.  Echocardiogram demonstrated normal pump function normal left ventricular wall thickness.  He had had prior syncope earlier in his youth.  No high risk symptoms such as syncope in the midst of exercise.  No family history of sudden cardiac death at an early age.  We will go ahead and place a Zio patch monitor for 2 weeks.  Want to make sure he does not have any adverse arrhythmias.  Based  upon his story and objective data, dehydration/fatigue makes the most sense.  His orthostatics were negative here.  Continue to encourage aggressive hydration, salt liberalization.  I am comfortable with him returning to work.    Follow-up: PRN.  I will follow-up with results of study.  Medication Adjustments/Labs and Tests Ordered: Current medicines are reviewed at length with the patient today.  Concerns regarding medicines are outlined above.   Orders Placed This Encounter  Procedures   LONG TERM MONITOR (3-14 DAYS)   EKG 12-Lead     No orders of the defined types were placed in this encounter.   Patient Instructions  Medication Instructions:  The current medical regimen is effective;  continue present plan and medications.  *If you need a refill on your cardiac medications before your next appointment, please call your pharmacy*  Testing/Procedures: ZIO XT- Long Term Monitor Instructions  Your physician has requested you wear a ZIO patch monitor for 14 days.   This is a single patch monitor. [employer] supplies one patch monitor per enrollment. Additional stickers are not available. Please do not apply patch if you will be having a Nuclear Stress Test,  Echocardiogram, Cardiac CT, MRI, or Chest Xray during the period you would be wearing the  monitor. The patch cannot be worn during these tests. You cannot remove and re-apply the  ZIO XT patch monitor.  Your ZIO patch monitor will be mailed 3 day USPS to your address on file. It may take 3-5 days  to receive your monitor after you have been enrolled.  Once you have received your monitor, please review the enclosed instructions. Your monitor  has already been registered assigning a specific monitor serial # to you.  Billing and Patient Assistance Program Information  We have supplied [employer] with any of your insurance information on file for billing purposes. [employer] offers a sliding scale Patient Assistance Program for patients that do not have  insurance, or whose insurance does not completely cover the cost of the ZIO monitor.  You must apply for the Patient Assistance Program to qualify for this discounted rate.  To apply, please call [employer] at [phone], select option 4, select option 2, ask to apply for  Patient Assistance Program. Meredeth Ide will ask your household income, and how many people  are in your household. They will quote your out-of-pocket cost based on that information.  [employer] will also be able to set up a 61-month, interest-free payment plan if needed.  Applying the monitor   Shave hair from upper left chest.  Hold abrader disc by orange tab. Rub abrader in 40 strokes over the upper left chest as  indicated in your monitor instructions.  Clean area with 4 enclosed alcohol pads. Let dry.  Apply patch as indicated in monitor instructions. Patch will be placed under collarbone on left  side of chest with arrow pointing upward.  Rub patch adhesive wings for 2 minutes. Remove  white label marked "1". Remove the white  label marked "2". Rub patch adhesive wings for 2 additional minutes.  While looking in a mirror, press and release button in center of patch. A small green light will  flash 3-4 times. This will be your only indicator that the monitor has been turned on.  Do not shower for the first 24 hours. You may shower after the first 24 hours.  Press the button if you feel a symptom. You will hear a small click. Record Date, Time and  Symptom in the Patient Logbook.  When you are ready to remove the patch, follow instructions on the last 2 pages of Patient  Logbook. Stick patch monitor onto the last page of Patient Logbook.  Place Patient Logbook in the blue and white box. Use locking tab on box and tape box closed  securely. The blue and white box has prepaid postage on it. Please place it in the mailbox as  soon as possible. Your physician should have your test results approximately 7 days after the  monitor has been mailed back to [facility].  Call [facility] Customer Care at [phone] if you have questions regarding  your ZIO XT patch monitor. Call them immediately if you see an orange light blinking on your  monitor.  If your monitor falls off in less than 4 days, contact our Monitor department at [phone].  If your monitor becomes loose or falls off after 4 days call [employer] at [phone] for  suggestions on securing your monitor  Follow-Up: At [facility], you and your health needs are our priority.  As part of our continuing mission to provide you with exceptional heart care, we have created designated Provider Care Teams.  These Care Teams include your primary Cardiologist (physician) and Advanced Practice Providers (APPs -  Physician Assistants and Nurse Practitioners) who all work together to provide you with the care you need, when you need it.  We recommend signing up for the patient portal called "MyChart".  Sign up  information is provided on this After Visit Summary.  MyChart is used to connect with patients for Virtual Visits (Telemedicine).  Patients are able to view lab/test results, encounter notes, upcoming appointments, etc.  Non-urgent messages can be sent to your provider as well.   To learn more about what you can do with MyChart, go to [email or web address].    Your next appointment:   FOLLOW UP WILL BE BASED ON THE RESULTS OF THE ABOVE TESTING.  Thank you for choosing [facility]!!    PLEASE STAY WELL HYDRATED DAILY!   I,Mathew Stumpf,acting as a Neurosurgeon for [employer], MD.,have documented all relevant documentation on the behalf of Donato Schultz, MD,as directed by  Donato Schultz, MD while in the presence of Donato Schultz, MD.  I, Donato Schultz, MD, have reviewed all documentation for this visit. The documentation on 11/12/20 for the exam, diagnosis, procedures, and orders are all accurate and complete.   Signed, Donato Schultz, MD  11/12/2020 5:30 PM    [facility]

## 2020-11-12 NOTE — Assessment & Plan Note (Addendum)
Syncope occurred while at work.  Based upon lab values such as elevated CPK, elevated creatinine he clearly had acute kidney injury likely as a result of dehydration/decreased intravascular volume.  His EKG did not show any adverse changes, normal intervals, nonspecific T wave changes noted on ECG-normal wall thickness on echocardiogram, i.e. no phenotypic evidence of hypertrophic cardiomyopathy.  Echocardiogram demonstrated normal pump function normal left ventricular wall thickness.  He had had prior syncope earlier in his youth.  No high risk symptoms such as syncope in the midst of exercise.  No family history of sudden cardiac death at an early age.  We will go ahead and place a Zio patch monitor for 2 weeks.  Want to make sure he does not have any adverse arrhythmias.  Based upon his story and objective data, dehydration/fatigue makes the most sense.  His orthostatics were negative here.  Continue to encourage aggressive hydration, salt liberalization.  I am comfortable with him returning to work.

## 2020-11-12 NOTE — Progress Notes (Unsigned)
Enrolled patient for a 14 day Zio XT monitor to be mailed to patients home   Status lost in [place] system/ order will be canceled

## 2020-11-12 NOTE — Patient Instructions (Signed)
Medication Instructions:  The current medical regimen is effective;  continue present plan and medications.  *If you need a refill on your cardiac medications before your next appointment, please call your pharmacy*  Testing/Procedures: [place] Monitor Instructions  Your physician has requested you wear a ZIO patch monitor for 14 days.  This is a single patch monitor. [employer] supplies one patch monitor per enrollment. Additional stickers are not available. Please do not apply patch if you will be having a Nuclear Stress Test,  Echocardiogram, Cardiac CT, MRI, or Chest Xray during the period you would be wearing the  monitor. The patch cannot be worn during these tests. You cannot remove and re-apply the  ZIO XT patch monitor.  Your ZIO patch monitor will be mailed 3 day USPS to your address on file. It may take 3-5 days  to receive your monitor after you have been enrolled.  Once you have received your monitor, please review the enclosed instructions. Your monitor  has already been registered assigning a specific monitor serial # to you.  Billing and Patient Assistance Program Information  We have supplied [employer] with any of your insurance information on file for billing purposes. [employer] offers a sliding scale Patient Assistance Program for patients that do not have  insurance, or whose insurance does not completely cover the cost of the ZIO monitor.  You must apply for the Patient Assistance Program to qualify for this discounted rate.  To apply, please call [employer] at [phone], select option 4, select option 2, ask to apply for  Patient Assistance Program. Theodore Demark will ask your household income, and how many people  are in your household. They will quote your out-of-pocket cost based on that information.  [employer] will also be able to set up a 24-month interest-free payment plan if needed.  Applying the monitor   Shave hair from upper left chest.  Hold abrader disc  by orange tab. Rub abrader in 40 strokes over the upper left chest as  indicated in your monitor instructions.  Clean area with 4 enclosed alcohol pads. Let dry.  Apply patch as indicated in monitor instructions. Patch will be placed under collarbone on left  side of chest with arrow pointing upward.  Rub patch adhesive wings for 2 minutes. Remove white label marked "1". Remove the white  label marked "2". Rub patch adhesive wings for 2 additional minutes.  While looking in a mirror, press and release button in center of patch. A small green light will  flash 3-4 times. This will be your only indicator that the monitor has been turned on.  Do not shower for the first 24 hours. You may shower after the first 24 hours.  Press the button if you feel a symptom. You will hear a small click. Record Date, Time and  Symptom in the Patient Logbook.  When you are ready to remove the patch, follow instructions on the last 2 pages of Patient  Logbook. Stick patch monitor onto the last page of Patient Logbook.  Place Patient Logbook in the blue and white box. Use locking tab on box and tape box closed  securely. The blue and white box has prepaid postage on it. Please place it in the mailbox as  soon as possible. Your physician should have your test results approximately 7 days after the  monitor has been mailed back to [facility]  Call [phone] you have questions regarding  your ZIO XT patch  monitor. Call them immediately if you see an orange light blinking on your  monitor.  If your monitor falls off in less than 4 days, contact our Monitor department at [phone].  If your monitor becomes loose or falls off after 4 days call [employer] at [phone] for  suggestions on securing your monitor  Follow-Up: At [facility], you and your health needs are our priority.  As part of our continuing mission to provide you with exceptional heart care, we have  created designated Provider Care Teams.  These Care Teams include your primary Cardiologist (physician) and Advanced Practice Providers (APPs -  Physician Assistants and Nurse Practitioners) who all work together to provide you with the care you need, when you need it.  We recommend signing up for the patient portal called "MyChart".  Sign up information is provided on this After Visit Summary.  MyChart is used to connect with patients for Virtual Visits (Telemedicine).  Patients are able to view lab/test results, encounter notes, upcoming appointments, etc.  Non-urgent messages can be sent to your provider as well.   To learn more about what you can do with MyChart, go to [email or web address].    Your next appointment:   FOLLOW UP WILL BE BASED ON THE RESULTS OF THE ABOVE TESTING.  Thank you for choosing [facility]!!    PLEASE STAY WELL HYDRATED DAILY!

## 2020-12-27 ENCOUNTER — Ambulatory Visit (HOSPITAL_COMMUNITY)
Admission: EM | Admit: 2020-12-27 | Discharge: 2020-12-27 | Disposition: A | Discharge status: Home / Self Care | Payer: BC Managed Care – PPO | Attending: Nurse Practitioner | Admitting: Nurse Practitioner

## 2020-12-27 ENCOUNTER — Other Ambulatory Visit: Payer: Self-pay

## 2020-12-27 ENCOUNTER — Encounter (HOSPITAL_COMMUNITY): Payer: Self-pay

## 2020-12-27 DIAGNOSIS — Z113 Encounter for screening for infections with a predominantly sexual mode of transmission: Secondary | ICD-10-CM | POA: Diagnosis present

## 2020-12-27 DIAGNOSIS — N489 Disorder of penis, unspecified: Secondary | ICD-10-CM | POA: Diagnosis present

## 2020-12-27 MED ORDER — VALACYCLOVIR HCL 1 G PO TABS: 1000.0000 mg | ORAL_TABLET | Freq: Three times a day (TID) | ORAL | 0 refills | Status: DC

## 2020-12-27 NOTE — ED Triage Notes (Signed)
Pt states while shaving he cut part of the bump.

## 2020-12-27 NOTE — ED Triage Notes (Signed)
Pt presents with c/o a bump on his genital area X 1 year. States it has grown and states during intercourse it feel uncomfortable.

## 2020-12-27 NOTE — Discharge Instructions (Addendum)
You have been seen today for sores on your penis  Start taking the valtrex today for possible herpes. This medication has been sent to your pharmacy.  If your test is negative for herpes then you can stop the medication  You will only be notified of positive results  You can log onto MyChart in a couple of days to review all of your test results  Do not have sex until results have been received and your symptoms have resolved

## 2020-12-27 NOTE — ED Provider Notes (Signed)
[place]    CSN: [id number] Arrival date & time: 12/27/20  [id number]      History   Chief Complaint Chief Complaint  Patient presents with   Genital Problem    HPI Danny Mccoy is a 35 y.o. male.   Subjective:  Danny Mccoy is a 35 y.o. male who complains of penile lesions. He first noticed them about 1 year ago. Initially they haven't been causing any pain, itching or discomfort but now it is irritating to him particularly during sexual intercourse. He reports that he accidentally cut one of the lesions while shaving on 11/14. The lesion bled some but stopped with pressure. He denies any pain, swelling or erythema at the site of the lesion. Patient is sexually active with one male partner at the moment. He does not routinely use condoms. He is concerned for possible STDs. He has a history of gonorrhea in the past. Patient denies dysuria, penile discharge, problems with urination, hematuria, back pain, abdominal pain, flank pain, fevers or body aches.    The following portions of the patient's history were reviewed and updated as appropriate: allergies, current medications, past family history, past medical history, past social history, past surgical history, and problem list.        History reviewed. No pertinent past medical history.  Patient Active Problem List   Diagnosis Date Noted   AKI (acute kidney injury) ([place])    Syncope 11/06/2020   CONSTIPATION 06/30/2007    History reviewed. No pertinent surgical history.     Home Medications    Prior to Admission medications   Medication Sig Start Date End Date Taking? Authorizing Provider  valACYclovir (VALTREX) 1000 MG tablet Take 1 tablet (1,000 mg total) by mouth 3 (three) times daily. 12/27/20  Yes Enrique Sack, FNP    Family History Family History  Problem Relation Age of Onset   Healthy Mother    Healthy Father     Social History Social History   Tobacco Use    Smoking status: Former    Types: Cigarettes, Cigars    Quit date: 01/10/2019    Years since quitting: 1.9   Smokeless tobacco: Never   Tobacco comments:    black and mild 2x/week  Vaping Use   Vaping Use: Never used  Substance Use Topics   Alcohol use: Yes    Comment: occ   Drug use: No     Allergies   Patient has no known allergies.   Review of Systems Review of Systems  Constitutional:  Negative for fever.  Gastrointestinal:  Negative for abdominal pain, diarrhea, nausea and vomiting.  Genitourinary:  Positive for genital sores. Negative for difficulty urinating, dysuria, flank pain, frequency, hematuria, penile discharge, penile pain, penile swelling, scrotal swelling and testicular pain.  Musculoskeletal:  Negative for back pain.    Physical Exam Triage Vital Signs ED Triage Vitals  Enc Vitals Group     BP 12/27/20 1104 121/70     Pulse Rate 12/27/20 1102 74     Resp 12/27/20 1102 19     Temp 12/27/20 1102 97.9 F (36.6 C)     Temp Source 12/27/20 1102 Oral     SpO2 12/27/20 1102 99 %     Weight --      Height --      Head Circumference --      Peak Flow --      Pain Score 12/27/20 1101 0     Pain Loc --  Pain Edu? --      Excl. in GC? --    No data found.  Updated Vital Signs BP 121/70   Pulse 74   Temp 97.9 F (36.6 C) (Oral)   Resp 19   SpO2 99%   Visual Acuity Right Eye Distance:   Left Eye Distance:   Bilateral Distance:    Right Eye Near:   Left Eye Near:    Bilateral Near:     Physical Exam Vitals and nursing note reviewed. Exam conducted with a chaperone present.  Constitutional:      Appearance: Normal appearance.  HENT:     Head: Normocephalic.  Cardiovascular:     Rate and Rhythm: Normal rate.  Pulmonary:     Effort: Pulmonary effort is normal.  Genitourinary:    Penis: Circumcised. Lesions present. No erythema, tenderness, discharge or swelling.      Testes: Normal.  Musculoskeletal:        General: Normal range of  motion.     Cervical back: Normal range of motion and neck supple.  Skin:    General: Skin is warm and dry.  Neurological:     General: No focal deficit present.     Mental Status: He is alert and oriented to person, place, and time.     UC Treatments / Results  Labs (all labs ordered are listed, but only abnormal results are displayed) Labs Reviewed  HSV 2 ANTIBODY, IGG  HSV 1 ANTIBODY, IGG  RPR  CYTOLOGY, (ORAL, ANAL, URETHRAL) ANCILLARY ONLY    EKG   Radiology No results found.  Procedures Procedures (including critical care time)  Medications Ordered in UC Medications - No data to display  Initial Impression / Assessment and Plan / UC Course  I have reviewed the triage vital signs and the nursing notes.  Pertinent labs & imaging results that were available during my care of the patient were reviewed by me and considered in my medical decision making (see chart for details).     35 year old sexually active male presenting with painless lesions to the glans penis, frenulum and penis. Lesions have been present for over a year but more recently started to cause some discomfort after he accidentally cut one of them with a razor while shaving. No sign of infection at the lacerated lesion. It seems to be healing well and does not require any intervention at this time. Testing for HSV, RPR and cytology pending. Valtrex prescribed empirically. Will await for the results of testing to determine if any further intervention is needed. Advised patient to abstain from sexual intercourse/contact until labs have resulted and lesions have healed.   Today's evaluation has revealed no signs of a dangerous process. Discussed diagnosis with patient and/or guardian. Patient and/or guardian aware of their diagnosis, possible red flag symptoms to watch out for and need for close follow up. Patient and/or guardian understands verbal and written discharge instructions. Patient and/or guardian  comfortable with plan and disposition.  Patient and/or guardian has a clear mental status at this time, good insight into illness (after discussion and teaching) and has clear judgment to make decisions regarding their care  This care was provided during an unprecedented National Emergency due to the Novel Coronavirus (COVID-19) pandemic. COVID-19 infections and transmission risks place heavy strains on healthcare resources.  As this pandemic evolves, our facility, providers, and staff strive to respond fluidly, to remain operational, and to provide care relative to available resources and information. Outcomes are unpredictable  and treatments are without well-defined guidelines. Further, the impact of COVID-19 on all aspects of urgent care, including the impact to patients seeking care for reasons other than COVID-19, is unavoidable during this national emergency. At this time of the global pandemic, management of patients has significantly changed, even for non-COVID positive patients given high local and regional COVID volumes at this time requiring high healthcare system and resource utilization. The standard of care for management of both COVID suspected and non-COVID suspected patients continues to change rapidly at the local, regional, national, and global levels. This patient was worked up and treated to the best available but ever changing evidence and resources available at this current time.   Documentation was completed with the aid of voice recognition software. Transcription may contain typographical errors. Final Clinical Impressions(s) / UC Diagnoses   Final diagnoses:  Penile lesion  Screen for STD (sexually transmitted disease)     Discharge Instructions      You have been seen today for sores on your penis  Start taking the valtrex today for possible herpes. This medication has been sent to your pharmacy.  If your test is negative for herpes then you can stop the medication  You  will only be notified of positive results  You can log onto MyChart in a couple of days to review all of your test results  Do not have sex until results have been received and your symptoms have resolved      ED Prescriptions     Medication Sig Dispense Auth. Provider   valACYclovir (VALTREX) 1000 MG tablet Take 1 tablet (1,000 mg total) by mouth 3 (three) times daily. 21 tablet Enrique Sack, FNP      PDMP not reviewed this encounter.   Enrique Sack, [place] 12/27/20 1228

## 2020-12-28 LAB — HSV 2 ANTIBODY, IGG: HSV 2 Glycoprotein G Ab, IgG: <0.91 index (ref 0.00–0.90)

## 2020-12-28 LAB — RPR: RPR Ser Ql: NONREACTIVE

## 2020-12-28 LAB — HSV 1 ANTIBODY, IGG: HSV 1 Glycoprotein G Ab, IgG: 24.6 index — ABNORMAL HIGH (ref 0.00–0.90)

## 2020-12-30 ENCOUNTER — Telehealth (HOSPITAL_COMMUNITY): Payer: Self-pay

## 2020-12-30 LAB — CYTOLOGY, (ORAL, ANAL, URETHRAL) ANCILLARY ONLY
Chlamydia: NEGATIVE
Comment: NEGATIVE
Comment: NORMAL
Neisseria Gonorrhea: NEGATIVE

## 2021-02-25 ENCOUNTER — Encounter (HOSPITAL_COMMUNITY): Payer: Self-pay | Admitting: Emergency Medicine

## 2021-02-25 ENCOUNTER — Emergency Department (HOSPITAL_COMMUNITY)
Admission: EM | Admit: 2021-02-25 | Discharge: 2021-02-25 | Disposition: A | Discharge status: Home / Self Care | Payer: BC Managed Care – PPO | Attending: Emergency Medicine | Admitting: Emergency Medicine

## 2021-02-25 ENCOUNTER — Other Ambulatory Visit: Payer: Self-pay

## 2021-02-25 DIAGNOSIS — Z113 Encounter for screening for infections with a predominantly sexual mode of transmission: Secondary | ICD-10-CM | POA: Insufficient documentation

## 2021-02-25 DIAGNOSIS — Z202 Contact with and (suspected) exposure to infections with a predominantly sexual mode of transmission: Secondary | ICD-10-CM

## 2021-02-25 DIAGNOSIS — R369 Urethral discharge, unspecified: Secondary | ICD-10-CM | POA: Insufficient documentation

## 2021-02-25 DIAGNOSIS — R300 Dysuria: Secondary | ICD-10-CM | POA: Insufficient documentation

## 2021-02-25 LAB — URINALYSIS, ROUTINE W REFLEX MICROSCOPIC
Bilirubin Urine: NEGATIVE
Glucose, UA: NEGATIVE mg/dL
Hgb urine dipstick: NEGATIVE
Ketones, ur: NEGATIVE mg/dL
Leukocytes,Ua: NEGATIVE
Nitrite: NEGATIVE
Protein, ur: NEGATIVE mg/dL
Specific Gravity, Urine: 1.025 (ref 1.005–1.030)
pH: 6.5 (ref 5.0–8.0)

## 2021-02-25 MED ORDER — CEFTRIAXONE SODIUM 500 MG IJ SOLR
500.0000 mg | Freq: Once | INTRAMUSCULAR | Status: AC
  Administered 2021-02-25: 500 mg via INTRAMUSCULAR
  Filled 2021-02-25: qty 500

## 2021-02-25 MED ORDER — LIDOCAINE HCL (PF) 1 % IJ SOLN
1.0000 mL | Freq: Once | INTRAMUSCULAR | Status: AC
Start: 2021-02-25 — End: 2021-02-25
  Administered 2021-02-25: 1 mL
  Filled 2021-02-25: qty 5

## 2021-02-25 MED ORDER — DOXYCYCLINE HYCLATE 100 MG PO TABS
100.0000 mg | ORAL_TABLET | Freq: Two times a day (BID) | ORAL | 0 refills | Status: DC
Start: 2021-02-25 — End: 2021-03-14

## 2021-02-25 NOTE — ED Triage Notes (Signed)
C/o tingling with urination and penile discharge x 2 weeks.

## 2021-02-25 NOTE — ED Provider Triage Note (Signed)
Emergency Medicine Provider Triage Evaluation Note  Danny Mccoy , a 36 y.o. male  was evaluated in triage.  Pt complains of dysuria and penile discharge for 2 weeks.  Patient requesting STD testing.  Review of Systems  Positive: Dysuria, penile discharge Negative: Abdominal pain, flank pain  Physical Exam  BP 103/70 (BP Location: Right Arm)    Pulse 82    Temp 98.4 F (36.9 C) (Oral)    Resp 16    SpO2 97%  Gen:   Awake, no distress   Resp:  Normal effort  MSK:   Moves extremities without difficulty  Other:    Medical Decision Making  Medically screening exam initiated at 6:01 PM.  Appropriate orders placed.  Danny Mccoy was informed that the remainder of the evaluation will be completed by another provider, this initial triage assessment does not replace that evaluation, and the importance of remaining in the ED until their evaluation is complete.     Su Monks, PA-C 02/25/21 1802

## 2021-02-25 NOTE — ED Provider Notes (Signed)
[facility] EMERGENCY DEPARTMENT Provider Note   CSN: [id number] Arrival date & time: 02/25/21  1545     History  Chief Complaint  Patient presents with   SEXUALLY TRANSMITTED DISEASE    Danny Mccoy is a 36 y.o. male with a past medical history significant for previous gonorrhea and infection who presents with 2 weeks of penile discharge, dysuria.  Patient reports sexually active with male partner with similar symptoms.  Patient reports his male partner was recently treated but she will not tell him for what.  Patient denies any rash or other lesions.  Patient reports he tried to take 4 tablets of doxycycline without any relief.  Patient declines testing for HIV, syphilis at this time.  Denies any rectal pain, denies any throat pain.  Denies fever, chills.  HPI     Home Medications Prior to Admission medications   Medication Sig Start Date End Date Taking? Authorizing Provider  doxycycline (VIBRA-TABS) 100 MG tablet Take 1 tablet (100 mg total) by mouth 2 (two) times daily. 02/25/21  Yes Mizuki Hoel H, PA-C  valACYclovir (VALTREX) 1000 MG tablet Take 1 tablet (1,000 mg total) by mouth 3 (three) times daily. 12/27/20   Lurline Idol, FNP      Allergies    Patient has no known allergies.    Review of Systems   Review of Systems  Genitourinary:  Positive for dysuria and penile discharge.  All other systems reviewed and are negative.  Physical Exam Updated Vital Signs BP 130/75 (BP Location: Right Leg)    Pulse 71    Temp 98.7 F (37.1 C) (Oral)    Resp 16    SpO2 99%  Physical Exam Vitals and nursing note reviewed.  Constitutional:      General: He is not in acute distress.    Appearance: Normal appearance.  HENT:     Head: Normocephalic and atraumatic.  Eyes:     General:        Right eye: No discharge.        Left eye: No discharge.  Cardiovascular:     Rate and Rhythm: Normal rate and regular rhythm.  Pulmonary:      Effort: Pulmonary effort is normal. No respiratory distress.  Genitourinary:    Penis: Normal.      Testes: Normal.     Comments: No active discharge at this time, penis within normal limits, testicles within normal limits.  No evidence of hernia.  No evidence of epididymitis. Musculoskeletal:        General: No deformity.  Skin:    General: Skin is warm and dry.  Neurological:     Mental Status: He is alert and oriented to person, place, and time.  Psychiatric:        Mood and Affect: Mood normal.        Behavior: Behavior normal.    ED Results / Procedures / Treatments   Labs (all labs ordered are listed, but only abnormal results are displayed) Labs Reviewed  URINALYSIS, ROUTINE W REFLEX MICROSCOPIC  GC/CHLAMYDIA PROBE AMP ([place]) NOT AT [facility]    EKG None  Radiology No results found.  Procedures Procedures    Medications Ordered in ED Medications  cefTRIAXone (ROCEPHIN) injection 500 mg (has no administration in time range)  lidocaine (PF) (XYLOCAINE) 1 % injection 1 mL (has no administration in time range)    ED Course/ Medical Decision Making/ A&P  Medical Decision Making  Overall well-appearing patient with history of STI presents with 2 weeks of penile discharge, dysuria.  My differential diagnosis includes UTI, urethritis, STI including gonorrhea, chlamydia.  Given high prevalence of COVID infection discussed testing for HIV, syphilis, patient declines at this time.  My general exam did not reveal any evidence of discharge, patient reports he has had active discharge earlier today, and prior to my coming into the room.  Does endorse some active dysuria.  I personally ordered and reviewed urinalysis which shows no abnormalities at this time.  His GC chlamydia probe is pending.  After discussion with patient he agrees to presumptive treatment for GC, chlamydia.  Encourage follow-up with community health and wellness.  Counseled on  safe sex practice.  Encouraged to discontinue sexual activity until treatment is complete, symptoms are resolved.  Encouraged follow-up if symptoms do not resolve despite treatment.  Encouraged education of all current sexual partners of his test results when they come back.  We will administer ceftriaxone, discharged with doxycycline.  Patient discharged in stable condition at this time, return precautions given. Final Clinical Impression(s) / ED Diagnoses Final diagnoses:  Penile discharge  STD exposure    Rx / DC Orders ED Discharge Orders          Ordered    doxycycline (VIBRA-TABS) 100 MG tablet  2 times daily        02/25/21 2037              Elmarie Devlin, Edyth Gunnels 02/25/21 2042    Charlynne Pander, MD 02/25/21 2328

## 2021-02-25 NOTE — Discharge Instructions (Addendum)
Please take the entire course of antibiotics I am prescribing. Please inform all current sexual partners of your symptoms, and check your diagnosis on your patient portal in the next 24-48 hours. If your symptoms are persistent despite treatment please return for further evaluation.

## 2021-02-25 NOTE — ED Notes (Signed)
Discharge instructions,prescriptions, and follow up care reviewed and explained. Pt verbalized understanding.  ?

## 2021-03-14 ENCOUNTER — Other Ambulatory Visit: Payer: Self-pay

## 2021-03-14 ENCOUNTER — Encounter (HOSPITAL_COMMUNITY): Payer: Self-pay

## 2021-03-14 ENCOUNTER — Ambulatory Visit (HOSPITAL_COMMUNITY)
Admission: EM | Admit: 2021-03-14 | Discharge: 2021-03-14 | Disposition: A | Discharge status: Home / Self Care | Payer: Self-pay | Attending: Emergency Medicine | Admitting: Emergency Medicine

## 2021-03-14 DIAGNOSIS — Z202 Contact with and (suspected) exposure to infections with a predominantly sexual mode of transmission: Secondary | ICD-10-CM

## 2021-03-14 HISTORY — DX: Herpesviral infection, unspecified: B00.9

## 2021-03-14 MED ORDER — VALACYCLOVIR HCL 1 G PO TABS
1000.0000 mg | ORAL_TABLET | Freq: Every day | ORAL | 3 refills | Status: DC
Start: 2021-03-14 — End: 2021-03-14

## 2021-03-14 NOTE — ED Triage Notes (Signed)
Pt presents for medication refill on his valtrex.  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

## 2021-03-20 ENCOUNTER — Encounter (HOSPITAL_COMMUNITY): Payer: Self-pay

## 2021-03-20 ENCOUNTER — Ambulatory Visit (HOSPITAL_COMMUNITY)
Admission: EM | Admit: 2021-03-20 | Discharge: 2021-03-20 | Disposition: A | Discharge status: Home / Self Care | Payer: Self-pay | Attending: Family Medicine | Admitting: Family Medicine

## 2021-03-20 DIAGNOSIS — H579 Unspecified disorder of eye and adnexa: Secondary | ICD-10-CM

## 2021-03-20 DIAGNOSIS — H1032 Unspecified acute conjunctivitis, left eye: Secondary | ICD-10-CM

## 2021-03-20 MED ORDER — GENTAMICIN SULFATE 0.3 % OP SOLN: 2.0000 [drp] | Freq: Three times a day (TID) | OPHTHALMIC | 0 refills | Status: AC

## 2021-03-20 NOTE — ED Provider Notes (Addendum)
[facility]    CSN: [id number] Arrival date & time: 03/20/21  1715      History   Chief Complaint Chief Complaint  Patient presents with   Foreign Body in Eye    HPI Danny Mccoy is a 36 y.o. male.    Foreign Body in Eye  Here for an irritation in his left eye for the last 2 to 3 days. For some time, possibly a couple of years, he has had a brown spot to the the lateral side of his iris in his left eye.  It has increased in size slightly.  Now in the last few days he has had some pink color to that eye and it is felt irritated and he has had a foreign body sensation.  He has not experienced having any dust or anything fly up in his eye.  He did try to go see an eye doctor and was told he had to have a referral.  It sounds like he had tried to make an appointment with the retinal specialist.  He also does not have a primary doctor  Past Medical History:  Diagnosis Date   HSV-1 infection     Patient Active Problem List   Diagnosis Date Noted   AKI (acute kidney injury) ([facility])    Syncope 11/06/2020   CONSTIPATION 06/30/2007    History reviewed. No pertinent surgical history.     Home Medications    Prior to Admission medications   Medication Sig Start Date End Date Taking? Authorizing Provider  gentamicin (GARAMYCIN) 0.3 % ophthalmic solution Place 2 drops into the left eye 3 (three) times daily for 5 days. 03/20/21 03/25/21 Yes Jourdyn Hasler, Janace Aris, MD    Family History Family History  Problem Relation Age of Onset   Healthy Mother    Healthy Father     Social History Social History   Tobacco Use   Smoking status: Former    Types: Cigarettes, Cigars    Quit date: 01/10/2019    Years since quitting: 2.1   Smokeless tobacco: Never   Tobacco comments:    black and mild 2x/week  Vaping Use   Vaping Use: Never used  Substance Use Topics   Alcohol use: Yes    Comment: occ   Drug use: No     Allergies   Patient has no known  allergies.   Review of Systems Review of Systems   Physical Exam Triage Vital Signs ED Triage Vitals  Enc Vitals Group     BP 03/20/21 1855 120/77     Pulse Rate 03/20/21 1855 72     Resp 03/20/21 1855 18     Temp 03/20/21 1855 98.5 F (36.9 C)     Temp Source 03/20/21 1855 Oral     SpO2 03/20/21 1855 97 %     Weight --      Height --      Head Circumference --      Peak Flow --      Pain Score 03/20/21 1859 3     Pain Loc --      Pain Edu? --      Excl. in GC? --    No data found.  Updated Vital Signs BP 120/77 (BP Location: Right Arm)    Pulse 72    Temp 98.5 F (36.9 C) (Oral)    Resp 18    SpO2 97%   Visual Acuity Right Eye Distance:   Left Eye Distance:  Bilateral Distance:    Right Eye Near:   Left Eye Near:    Bilateral Near:     Physical Exam Vitals reviewed.  Constitutional:      General: He is not in acute distress.    Appearance: He is not toxic-appearing.  Eyes:     Extraocular Movements: Extraocular movements intact.     Pupils: Pupils are equal, round, and reactive to light.     Comments: He has a 1-2 mm macule on the lateral conjunctiva of the left eye, near the iris. Slight injection of the eye noted.   Neurological:     General: No focal deficit present.     Mental Status: He is alert and oriented to person, place, and time.  Psychiatric:        Behavior: Behavior normal.     UC Treatments / Results  Labs (all labs ordered are listed, but only abnormal results are displayed) Labs Reviewed - No data to display  EKG   Radiology No results found.  Procedures Procedures (including critical care time)  Medications Ordered in UC Medications - No data to display  Initial Impression / Assessment and Plan / UC Course  I have reviewed the triage vital signs and the nursing notes.  Pertinent labs & imaging results that were available during my care of the patient were reviewed by me and considered in my medical decision making (see  chart for details).     Will treat for conjunctivitis; discussed we do not place referrals here, as we do not have anyone to work the referral and send info. Suggestion made for an ophthalmologist, but also request made for assistance in finding a pcp Final Clinical Impressions(s) / UC Diagnoses   Final diagnoses:  Acute conjunctivitis of left eye, unspecified acute conjunctivitis type  Eye lesion     Discharge Instructions      Use gentamicin eyedrops in your left eye 2 drops 3 times daily for 5 days Someone will call you in the next 2 business days to help you make an appointment with a primary doctor       ED Prescriptions     Medication Sig Dispense Auth. Provider   gentamicin (GARAMYCIN) 0.3 % ophthalmic solution Place 2 drops into the left eye 3 (three) times daily for 5 days. 5 mL Zenia Resides, MD      PDMP not reviewed this encounter.   Zenia Resides, MD 03/20/21 Norberta Keens    Zenia Resides, MD 03/20/21 Ernestina Columbia

## 2021-03-20 NOTE — ED Triage Notes (Signed)
Pt believes something may be in in his left eye; pt states it has been irritated for the past few days.

## 2021-03-20 NOTE — Discharge Instructions (Addendum)
Use gentamicin eyedrops in your left eye 2 drops 3 times daily for 5 days Someone will call you in the next 2 business days to help you make an appointment with a primary doctor

## 2021-04-06 ENCOUNTER — Emergency Department (HOSPITAL_COMMUNITY)
Admission: EM | Admit: 2021-04-06 | Discharge: 2021-04-07 | Disposition: A | Discharge status: Home / Self Care | Payer: Self-pay | Attending: Emergency Medicine | Admitting: Emergency Medicine

## 2021-04-06 ENCOUNTER — Encounter (HOSPITAL_COMMUNITY): Payer: Self-pay | Admitting: Emergency Medicine

## 2021-04-06 ENCOUNTER — Emergency Department (HOSPITAL_COMMUNITY): Payer: Self-pay

## 2021-04-06 DIAGNOSIS — H11433 Conjunctival hyperemia, bilateral: Secondary | ICD-10-CM | POA: Insufficient documentation

## 2021-04-06 DIAGNOSIS — R7989 Other specified abnormal findings of blood chemistry: Secondary | ICD-10-CM | POA: Insufficient documentation

## 2021-04-06 DIAGNOSIS — R9431 Abnormal electrocardiogram [ECG] [EKG]: Secondary | ICD-10-CM | POA: Insufficient documentation

## 2021-04-06 DIAGNOSIS — Z20822 Contact with and (suspected) exposure to covid-19: Secondary | ICD-10-CM | POA: Insufficient documentation

## 2021-04-06 DIAGNOSIS — R5383 Other fatigue: Secondary | ICD-10-CM | POA: Insufficient documentation

## 2021-04-06 LAB — BASIC METABOLIC PANEL
Anion gap: 7 (ref 5–15)
BUN: 13 mg/dL (ref 6–20)
CO2: 28 mmol/L (ref 22–32)
Calcium: 9 mg/dL (ref 8.9–10.3)
Chloride: 104 mmol/L (ref 98–111)
Creatinine, Ser: 1.3 mg/dL — ABNORMAL HIGH (ref 0.61–1.24)
GFR, Estimated: >60 mL/min (ref >60)
Glucose, Bld: 96 mg/dL (ref 70–99)
Potassium: 4 mmol/L (ref 3.5–5.1)
Sodium: 139 mmol/L (ref 135–145)

## 2021-04-06 LAB — TROPONIN I (HIGH SENSITIVITY): Troponin I (High Sensitivity): 15 ng/L (ref <18)

## 2021-04-06 LAB — CBC
HCT: 44.6 % (ref 39.0–52.0)
Hemoglobin: 15.7 g/dL (ref 13.0–17.0)
MCH: 31.8 pg (ref 26.0–34.0)
MCHC: 35.2 g/dL (ref 30.0–36.0)
MCV: 90.5 fL (ref 80.0–100.0)
Platelets: 260 10*3/uL (ref 150–400)
RBC: 4.93 MIL/uL (ref 4.22–5.81)
RDW: 13.1 % (ref 11.5–15.5)
WBC: 8.2 10*3/uL (ref 4.0–10.5)
nRBC: 0 % (ref 0.0–0.2)

## 2021-04-06 MED ORDER — FLUORESCEIN SODIUM 1 MG OP STRP
1.0000 | ORAL_STRIP | Freq: Once | OPHTHALMIC | Status: AC
  Administered 2021-04-07: 1 via OPHTHALMIC
  Filled 2021-04-06: qty 1

## 2021-04-06 MED ORDER — ALBUTEROL SULFATE HFA 108 (90 BASE) MCG/ACT IN AERS
2.0000 | INHALATION_SPRAY | RESPIRATORY_TRACT | Status: DC | PRN
  Filled 2021-04-06: qty 6.7

## 2021-04-06 NOTE — ED Triage Notes (Signed)
Pt reported to ED with c/o SOB and eye redness. Pt states he was playing basketball and went to the gym today and has had shortness of breath since. Also states that eye redness has been present for one month, was seen at urgent care and diagnosed with conjunctivitis and given eye drops with no relief in symptoms, is concerned because employer asks him repeatedly "if he smokes".

## 2021-04-07 LAB — TSH: TSH: 2.13 u[IU]/mL (ref 0.350–4.500)

## 2021-04-07 LAB — RESP PANEL BY RT-PCR (FLU A&B, COVID) ARPGX2
Influenza A by PCR: NEGATIVE
Influenza B by PCR: NEGATIVE
SARS Coronavirus 2 by RT PCR: NEGATIVE

## 2021-04-07 LAB — TROPONIN I (HIGH SENSITIVITY): Troponin I (High Sensitivity): 13 ng/L (ref <18)

## 2021-04-07 MED ORDER — CETIRIZINE HCL 10 MG PO TABS: 10.0000 mg | ORAL_TABLET | Freq: Every day | ORAL | 0 refills | Status: DC

## 2021-04-07 NOTE — Discharge Instructions (Addendum)
You were seen in the ER today for eye redness, fatigue, and trouble breathing.   Your chest x-ray was normal.  Your blood work was overall reassuring with the exception that your creatinine which looks at your kidney function was mildly elevated, please be sure to stay well-hydrated, please have this rechecked by primary care provider.  Your EKG is abnormal, we would like you to follow-up with the cardiologist you have seen previously, their phone numbers in your discharge instructions.  We have prescribed you Zyrtec to take daily to try to help with your eye redness in case this is allergy related.  Please follow up with the ophthalmologist provided in your discharge instructions for recheck/further evaluation.   You may use your inhaler 1-2 puffs every 4-6 hours as needed for trouble breathing.   We would like you to also follow up with a primary care provider in order to establish care and to follow up on your symptoms.  Please call our community health and wellness center or call the phone number circled in your discharge instructions to help facilitate establishing care somewhere.  Return to the emergency department for any new or worsening symptoms including but not limited to vision changes, loss of vision, significant eye pain, new or worsening pain, increased work of breathing, chest pain, passing out, or any other concerns.

## 2021-04-07 NOTE — ED Provider Notes (Signed)
[facility] Provider Note   CSN: [id number] Arrival date & time: 04/06/21  2005     History  Chief Complaint  Patient presents with   Eye Problem    Danny Mccoy is a 36 y.o. male who presents to the ED with complaints of eye erythema x 1 month and fatigue for the past few months.   Patient reports daily eye redness bilaterally, no alleviating/aggravating factors. Seen @ UC, given abx drops, took without relief or change. Reports no pain with this or vision changes. Does not wear contact lenses or glasses. Denies smoking. Denies new environments/exposures. Denies eye itching, eye drainage, eye pain, or photophobia.   Patient reports fatigue for the past several months, states he gets winded at times- noted this again after playing basketball earlier today. States he is not having chest pain with the dyspnea. Sometimes his hands tingle- none at present. No alleviating/aggravating factors to these sxs. He does not have a primary care provider. He denies fever, chest pain, abdominal pain, vomiting, diarrhea, syncope, dizziness, rashes, recent foreign travel, leg pain/swelling, hemoptysis, recent surgery/trauma, recent long travel, hormone use, personal hx of cancer, or hx of DVT/PE.    HPI     Home Medications Prior to Admission medications   Not on File      Allergies    Patient has no known allergies.    Review of Systems   Review of Systems  Constitutional:  Positive for fatigue. Negative for chills and fever.  HENT:  Negative for congestion and ear pain.   Eyes:  Positive for redness. Negative for photophobia, pain, discharge, itching and visual disturbance.  Respiratory:  Positive for shortness of breath. Negative for cough.   Cardiovascular:  Negative for chest pain.  Gastrointestinal:  Negative for vomiting.  Neurological:        Positive for intermittent hand paresthesias.   All other systems reviewed and are  negative.  Physical Exam Updated Vital Signs BP 119/66 (BP Location: Right Arm)    Pulse 90    Temp 98.7 F (37.1 C) (Oral)    Resp 20    SpO2 99%  Physical Exam Vitals and nursing note reviewed.  Constitutional:      General: He is not in acute distress.    Appearance: He is well-developed. He is not toxic-appearing.  HENT:     Head: Normocephalic and atraumatic.     Right Ear: Ear canal normal. Tympanic membrane is not perforated, erythematous, retracted or bulging.     Left Ear: Ear canal normal. Tympanic membrane is not perforated, erythematous, retracted or bulging.     Ears:     Comments: No mastoid erythema/swellng/tenderness.     Nose:     Right Sinus: No maxillary sinus tenderness or frontal sinus tenderness.     Left Sinus: No maxillary sinus tenderness or frontal sinus tenderness.     Mouth/Throat:     Pharynx: Oropharynx is clear. Uvula midline. No oropharyngeal exudate or posterior oropharyngeal erythema.     Comments: Posterior oropharynx is symmetric appearing. Patient tolerating own secretions without difficulty. No trismus. No drooling. No hot potato voice. No swelling beneath the tongue, submandibular compartment is soft.  Eyes:     General: Vision grossly intact. Gaze aligned appropriately. Visual field deficit present.        Right eye: No discharge.        Left eye: No discharge.     Conjunctiva/sclera:     Right eye:  Right conjunctiva is injected (mild). No exudate.    Left eye: Left conjunctiva is injected (mild). No exudate.    Comments: No periorbital erythema/edema.  Fluorescein stain without uptake.  Brown discoloration that is 1-2 mm to the left side of the eye noted.   Cardiovascular:     Rate and Rhythm: Normal rate and regular rhythm.  Pulmonary:     Effort: Pulmonary effort is normal. No respiratory distress.     Breath sounds: Normal breath sounds. No wheezing, rhonchi or rales.  Abdominal:     General: There is no distension.     Palpations:  Abdomen is soft.     Tenderness: There is no abdominal tenderness.  Musculoskeletal:     Cervical back: Neck supple. No rigidity.     Right lower leg: No tenderness. No edema.     Left lower leg: No tenderness. No edema.  Lymphadenopathy:     Cervical: No cervical adenopathy.  Skin:    General: Skin is warm and dry.     Findings: No rash.  Neurological:     Mental Status: He is alert.     Comments: Alert, clear speech, sensation & strength grossly intact x 4. Cn III-XII grossly intact. Ambulatory  Psychiatric:        Behavior: Behavior normal.    ED Results / Procedures / Treatments   Labs (all labs ordered are listed, but only abnormal results are displayed) Labs Reviewed  BASIC METABOLIC PANEL - Abnormal; Notable for the following components:      Result Value   Creatinine, Ser 1.30 (*)    All other components within normal limits  RESP PANEL BY RT-PCR (FLU A&B, COVID) ARPGX2  CBC  TSH  TROPONIN I (HIGH SENSITIVITY)  TROPONIN I (HIGH SENSITIVITY)    EKG EKG Interpretation  Date/Time:  Sunday April 06 2021 20:22:56 EST Ventricular Rate:  88 PR Interval:  180 QRS Duration: 98 QT Interval:  362 QTC Calculation: 438 R Axis:   18 Text Interpretation: Normal sinus rhythm ST & T changes diffuse No significant change since Confirmed by Dory Horn) on 04/06/2021 11:17:23 PM  Radiology DG Chest 2 View  Result Date: 04/06/2021 CLINICAL DATA:  Dyspnea EXAM: CHEST - 2 VIEW COMPARISON:  None. FINDINGS: The heart size and mediastinal contours are within normal limits. Both lungs are clear. The visualized skeletal structures are unremarkable. IMPRESSION: No active cardiopulmonary disease. Electronically Signed   By: Fidela Salisbury M.D.   On: 04/06/2021 20:55    Procedures Procedures    Medications Ordered in ED Medications  albuterol (VENTOLIN HFA) 108 (90 Base) MCG/ACT inhaler 2 puff (has no administration in time range)  fluorescein ophthalmic strip 1 strip  (has no administration in time range)    ED Course/ Medical Decision Making/ A&P                           Medical Decision Making Amount and/or Complexity of Data Reviewed Labs: ordered. Radiology: ordered.  Risk OTC drugs. Prescription drug management.   Patient presents to the ED with complaints of eye erythema x 1 month and fatigue for the past few months, this involves an extensive number of treatment options, and is a complaint that carries with it a high risk of complications and morbidity. Nontoxic, vitals unremarkable. .    Additional history obtained:  Chart/& nursing note review.  UC visit 03/20/2021- given gentamicin drops.   EKG: Normal sinus rhythm ST &  T changes diffuse No significant change since  Lab Tests:  I reviewed & interpreted labs including:  CBC: Unremarkable.  BMP: elevated creatinine, mild increase from most recent on record, has been elevated in the past- PCP follow up.  Troponins: Flat  Covid/flu: negative TSH: WNL  Imaging Studies ordered:  I ordered and viewed the following imaging, agree with radiologist impression:  No active cardiopulmonary disease.  ED Course:   - Eye erythema- mild conjunctival injection noted. There is no fluorescein uptake on exam, no indication of corneal abrasion/ulceration or HSV. Patient is afebrile and without proptosis, entrapment, or consensual photophobia, no periorbital swelling- doubt periorbital or orbital cellulitis. Bilateral sxs, no fixed pupil, not painful- doubt acute angle closure glaucoma.. No significant visual acuity deficit. No recent eye trauma. No drainage and has already tried abx drops therefore feel bacterial conjunctivitis is unlikely. Will trial zyrtec for possible allergic process and have patient follow up with ophthalmology.   - Fatigue w/ intermittent dyspnea & hand paresthesias- reassuring work-up, EKG is abnormal but similar to prior- trops flat- doubt ACS will have patient follow up with  cardiology whom he has seen in the past. Low risk wells, perc negative- doubt PE. CXR w/o pneumonia, pneumothorax, or findings of fluid overload. No critical electrolyte or thyroid derangement. COVID/flu negative. Trial inhaler. Will need PCP follow up. Overall do not suspect emergent pathology at this time.    I discussed results, treatment plan, need for follow-up, and return precautions with the patient. Provided opportunity for questions, patient confirmed understanding and is in agreement with plan.   Findings and plan of care discussed with supervising physician Dr. Randal Buba who is in agreement.    Portions of this note were generated with [employer]. Dictation errors may occur despite best attempts at proofreading.   Final Clinical Impression(s) / ED Diagnoses Final diagnoses:  Conjunctival hyperemia of both eyes  Fatigue, unspecified type  Abnormal EKG  Elevated serum creatinine    Rx / DC Orders ED Discharge Orders          Ordered    cetirizine (ZYRTEC ALLERGY) 10 MG tablet  Daily        04/07/21 0133              Amaryllis Dyke, PA-C 04/07/21 0226    Palumbo, April, MD 04/07/21 [phone]

## 2021-04-22 ENCOUNTER — Encounter (HOSPITAL_COMMUNITY): Payer: Self-pay | Admitting: Emergency Medicine

## 2021-04-22 ENCOUNTER — Ambulatory Visit (HOSPITAL_COMMUNITY)
Admission: EM | Admit: 2021-04-22 | Discharge: 2021-04-22 | Disposition: A | Discharge status: Home / Self Care | Payer: Self-pay | Attending: Nurse Practitioner | Admitting: Nurse Practitioner

## 2021-04-22 ENCOUNTER — Other Ambulatory Visit: Payer: Self-pay

## 2021-04-22 DIAGNOSIS — K047 Periapical abscess without sinus: Secondary | ICD-10-CM

## 2021-04-22 DIAGNOSIS — K0889 Other specified disorders of teeth and supporting structures: Secondary | ICD-10-CM

## 2021-04-22 MED ORDER — IBUPROFEN 800 MG PO TABS: 800.0000 mg | ORAL_TABLET | Freq: Three times a day (TID) | ORAL | 0 refills | Status: AC | PRN

## 2021-04-22 MED ORDER — AMOXICILLIN 500 MG PO CAPS: 500.0000 mg | ORAL_CAPSULE | Freq: Three times a day (TID) | ORAL | 0 refills | Status: AC

## 2021-04-22 NOTE — ED Provider Notes (Signed)
?[facility] ? ? ? ?CSN: [id number] ?Arrival date & time: 04/22/21  [id number] ? ? ?  ? ?History   ?Chief Complaint ?Chief Complaint  ?Patient presents with  ? Dental Pain  ? ? ?HPI ?Danny Mccoy is a 36 y.o. male.  ? ?The patient is a [age] year old male who presents for dental pain.  Symptoms started approximately 3 days ago.  Pain is located in the right lower jaw.  Pain is worsened with eating, chewing, and talking.  He is also noticed swelling in the right lower jaw since onset of symptoms.  He states he feels like his throat is sore and that he has a earache but feels it is related to the dental pain.  He denies any fever, chills, recent trauma or injury to his teeth.  He currently does not have a dentist.  States he took ibuprofen with diphenhydramine but it did not help his symptoms. ? ? ?Dental Pain ?Location:  Lower ?Lower teeth location:  31/RL 2nd molar ?Quality:  Aching ?Timing:  Constant ?Progression:  Worsening ?Context: not dental fracture, not recent dental surgery and not trauma   ?Relieved by:  Nothing ?Ineffective treatments:  NSAIDs ?Associated symptoms: facial pain, facial swelling, gum swelling, headaches and neck pain   ? ?Past Medical History:  ?Diagnosis Date  ? HSV-1 infection   ? ? ?Patient Active Problem List  ? Diagnosis Date Noted  ? AKI (acute kidney injury) (HCC)   ? Syncope 11/06/2020  ? CONSTIPATION 06/30/2007  ? ? ?History reviewed. No pertinent surgical history. ? ? ? ? ?Home Medications   ? ?Prior to Admission medications   ?Medication Sig Start Date End Date Taking? Authorizing Provider  ?amoxicillin (AMOXIL) 500 MG capsule Take 1 capsule (500 mg total) by mouth 3 (three) times daily for 7 days. 04/22/21 04/29/21 Yes Ponciano Shealy-Warren, Sadie Haberhristie J, NP  ?ibuprofen (ADVIL) 800 MG tablet Take 1 tablet (800 mg total) by mouth every 8 (eight) hours as needed for up to 10 days for moderate pain. 04/22/21 05/02/21 Yes Moody Robben-Warren, Sadie Haberhristie J, NP  ?cetirizine (ZYRTEC ALLERGY) 10 MG  tablet Take 1 tablet (10 mg total) by mouth daily. 04/07/21   Petrucelli, Pleas KochSamantha R, PA-C  ? ? ?Family History ?Family History  ?Problem Relation Age of Onset  ? Healthy Mother   ? Healthy Father   ? ? ?Social History ?Social History  ? ?Tobacco Use  ? Smoking status: Former  ?  Types: Cigarettes, Cigars  ?  Quit date: 01/10/2019  ?  Years since quitting: 2.2  ? Smokeless tobacco: Never  ? Tobacco comments:  ?  black and mild 2x/week  ?Vaping Use  ? Vaping Use: Never used  ?Substance Use Topics  ? Alcohol use: Yes  ?  Comment: occ  ? Drug use: No  ? ? ? ?Allergies   ?Patient has no known allergies. ? ? ?Review of Systems ?Review of Systems  ?Constitutional:  Positive for appetite change.  ?HENT:  Positive for dental problem, ear pain, facial swelling and sore throat.   ?Eyes: Negative.   ?Respiratory: Negative.    ?Cardiovascular: Negative.   ?Gastrointestinal: Negative.   ?Musculoskeletal:  Positive for neck pain.  ?Skin: Negative.   ?Neurological:  Positive for headaches.  ?Psychiatric/Behavioral: Negative.    ? ? ?Physical Exam ?Triage Vital Signs ?ED Triage Vitals  ?Enc Vitals Group  ?   BP 04/22/21 0821 103/78  ?   Pulse Rate 04/22/21 0821 86  ?   Resp 04/22/21 [id number]  18  ?   Temp 04/22/21 0821 99 ?F (37.2 ?C)  ?   Temp Source 04/22/21 0821 Oral  ?   SpO2 04/22/21 0821 96 %  ?   Weight 04/22/21 0822 204 lb 12.9 oz (92.9 kg)  ?   Height 04/22/21 0822 6' (1.829 m)  ?   Head Circumference --   ?   Peak Flow --   ?   Pain Score 04/22/21 0822 8  ?   Pain Loc --   ?   Pain Edu? --   ?   Excl. in GC? --   ? ?No data found. ? ?Updated Vital Signs ?BP 103/78 (BP Location: Right Arm)   Pulse 86   Temp 99 ?F (37.2 ?C) (Oral)   Resp 18   Ht 6' (1.829 m)   Wt 204 lb 12.9 oz (92.9 kg)   SpO2 96%   BMI 27.78 kg/m?  ? ?Visual Acuity ?Right Eye Distance:   ?Left Eye Distance:   ?Bilateral Distance:   ? ?Right Eye Near:   ?Left Eye Near:    ?Bilateral Near:    ? ?Physical Exam ?Vitals reviewed.  ?Constitutional:   ?    General: He is not in acute distress. ?   Appearance: Normal appearance.  ?HENT:  ?   Head: Normocephalic and atraumatic.  ?   Right Ear: Tympanic membrane, ear canal and external ear normal.  ?   Left Ear: Tympanic membrane, ear canal and external ear normal.  ?   Nose: Nose normal.  ?   Mouth/Throat:  ?   Mouth: Mucous membranes are moist.  ?   Dentition: Dental tenderness, gingival swelling and dental abscesses present.  ?   Pharynx: Oropharynx is clear. No pharyngeal swelling or posterior oropharyngeal erythema.  ?   Tonsils: No tonsillar exudate.  ?   Comments: #31 on the right lower jaw with tenderness and swelling around the gum.  Swelling noted to the lower jaw/mandible with tenderness. ?Eyes:  ?   Extraocular Movements: Extraocular movements intact.  ?   Conjunctiva/sclera: Conjunctivae normal.  ?   Pupils: Pupils are equal, round, and reactive to light.  ?Cardiovascular:  ?   Rate and Rhythm: Normal rate and regular rhythm.  ?   Pulses: Normal pulses.  ?   Heart sounds: Normal heart sounds.  ?Pulmonary:  ?   Effort: Pulmonary effort is normal.  ?   Breath sounds: Normal breath sounds.  ?Abdominal:  ?   General: Bowel sounds are normal.  ?   Palpations: Abdomen is soft.  ?   Tenderness: There is no abdominal tenderness.  ?Musculoskeletal:  ?   Cervical back: Normal range of motion and neck supple.  ?Lymphadenopathy:  ?   Cervical: No cervical adenopathy.  ?Neurological:  ?   Mental Status: He is alert.  ? ? ? ?UC Treatments / Results  ?Labs ?(all labs ordered are listed, but only abnormal results are displayed) ?Labs Reviewed - No data to display ? ?EKG ? ? ?Radiology ?No results found. ? ?Procedures ?Procedures (including critical care time) ? ?Medications Ordered in UC ?Medications - No data to display ? ?Initial Impression / Assessment and Plan / UC Course  ?I have reviewed the triage vital signs and the nursing notes. ? ?Pertinent labs & imaging results that were available during my care of the patient  were reviewed by me and considered in my medical decision making (see chart for details). ? ?Patient presents with dental pain and  swelling in the right lower jaw for approximately 3 days.  He does have gingival swelling in the lower molar that is consistent with a possible abscess.  He does not have any systemic symptoms at this time such as fever, chills or abdominal pain.  He does complain of some sore throat and headache with his symptoms.  Patient will be prescribed amoxicillin for the possible abscess, and ibuprofen for pain.  Also recommended using Tylenol for breakthrough pain 1 to 2 hours after taking the ibuprofen if needed.  Patient also encouraged to use supportive care such as warm salt water gargles 3-4 times daily and warm compresses to the affected area.  He currently does not have a dentist in the area, will provide a list of dental resources.  Patient instructed to follow-up if symptoms do not improve. ?Final Clinical Impressions(s) / UC Diagnoses  ? ?Final diagnoses:  ?Dental infection  ?Pain, dental  ? ? ? ?Discharge Instructions   ? ?  ?Take medication as prescribed. ?Recommend using warm salt water gargles 3-4 times a day to help with pain. ?Apply warm compresses to the affected area as needed for pain and discomfort. ?I am providing you with a list of dental resources, follow-up within the next 7 to 10 days. ? ? ? ? ?ED Prescriptions   ? ? Medication Sig Dispense Auth. Provider  ? amoxicillin (AMOXIL) 500 MG capsule Take 1 capsule (500 mg total) by mouth 3 (three) times daily for 7 days. 21 capsule Katrina Daddona-Warren, Sadie Haber, NP  ? ibuprofen (ADVIL) 800 MG tablet Take 1 tablet (800 mg total) by mouth every 8 (eight) hours as needed for up to 10 days for moderate pain. 30 tablet Arthur Aydelotte-Warren, Sadie Haber, NP  ? ?  ? ?PDMP not reviewed this encounter. ?  ?Abran Cantor, NP ?04/22/21 0847 ? ?

## 2021-04-22 NOTE — ED Triage Notes (Signed)
Pt reports dental pain x 3 days. States took pain medication with no relief.  ?

## 2021-04-22 NOTE — Discharge Instructions (Addendum)
Take medication as prescribed.  May take Tylenol 500 mg 1 tablet approximately 2 hours after ibuprofen for any breakthrough pain. ?Recommend using warm salt water gargles 3-4 times a day to help with pain. ?Apply warm compresses to the affected area as needed for pain and discomfort. ?I am providing you with a list of dental resources, follow-up within the next 7 to 10 days. ?Follow-up if symptoms do not improve. ?

## 2021-04-25 ENCOUNTER — Ambulatory Visit (HOSPITAL_COMMUNITY)
Admission: EM | Admit: 2021-04-25 | Discharge: 2021-04-25 | Disposition: A | Discharge status: Home / Self Care | Payer: Self-pay | Attending: Family Medicine | Admitting: Family Medicine

## 2021-04-25 ENCOUNTER — Encounter (HOSPITAL_COMMUNITY): Payer: Self-pay

## 2021-04-25 ENCOUNTER — Other Ambulatory Visit: Payer: Self-pay

## 2021-04-25 DIAGNOSIS — R197 Diarrhea, unspecified: Secondary | ICD-10-CM | POA: Insufficient documentation

## 2021-04-25 DIAGNOSIS — R0981 Nasal congestion: Secondary | ICD-10-CM | POA: Insufficient documentation

## 2021-04-25 DIAGNOSIS — R509 Fever, unspecified: Secondary | ICD-10-CM | POA: Insufficient documentation

## 2021-04-25 DIAGNOSIS — U071 COVID-19: Secondary | ICD-10-CM | POA: Insufficient documentation

## 2021-04-25 DIAGNOSIS — B349 Viral infection, unspecified: Secondary | ICD-10-CM

## 2021-04-25 DIAGNOSIS — J029 Acute pharyngitis, unspecified: Secondary | ICD-10-CM | POA: Insufficient documentation

## 2021-04-25 DIAGNOSIS — R0602 Shortness of breath: Secondary | ICD-10-CM | POA: Insufficient documentation

## 2021-04-25 LAB — SARS CORONAVIRUS 2 (TAT 6-24 HRS): SARS Coronavirus 2: POSITIVE — AB

## 2021-04-25 MED ORDER — ACETAMINOPHEN 325 MG PO TABS
ORAL_TABLET | ORAL | Status: AC
  Filled 2021-04-25: qty 3

## 2021-04-25 MED ORDER — ACETAMINOPHEN 325 MG PO TABS
975.0000 mg | ORAL_TABLET | Freq: Once | ORAL | Status: AC
  Administered 2021-04-25: 975 mg via ORAL

## 2021-04-25 MED ORDER — CYCLOBENZAPRINE HCL 5 MG PO TABS: 5.0000 mg | ORAL_TABLET | Freq: Three times a day (TID) | ORAL | 0 refills | Status: DC | PRN

## 2021-04-25 NOTE — ED Triage Notes (Signed)
Pt presents with complaints of fever, cough, body aches, and sore throat x 3 days. Reports feeling shortness of breath.  ?

## 2021-04-25 NOTE — Discharge Instructions (Signed)
In theYour symptoms today are most likely being caused by a virus and should steadily improve in time it can take up to 7 to 10 days before you truly start to see a turnaround however things will get better ? ?You are currently taking Augmentin which is an antibiotic that we will cover for infection lungs, upper airways, nasal canal, sinuses, ears and throat ? ?You are being tested for COVID, your labs are pending, you will be notified of any positive results, if you are positive and antiviral medication will be sent to the pharmacy for use ? ?You may take muscle relaxers every 8 hours to help with your body aches and discomfort ?   ?Please continue use of Tylenol and/or Ibuprofen as needed for fever reduction and pain relief. ? ?You are offered an inhaler today to help with your shortness of breath which you declined ? ?Your work note is the very last page of your paperwork, it has been written for you to return on Monday, I am legally not able to write you a note for a longer timeframe because this urgent care is a acute care setting ? ?You may attempt any of the following below in addition ?  ?For cough: honey 1/2 to 1 teaspoon (you can dilute the honey in water or another fluid).  You can also use guaifenesin and dextromethorphan for cough. You can use a humidifier for chest congestion and cough.  If you don't have a humidifier, you can sit in the bathroom with the hot shower running.    ?  ?For sore throat: try warm salt water gargles, cepacol lozenges, throat spray, warm tea or water with lemon/honey, popsicles or ice, or OTC cold relief medicine for throat discomfort. ?  ?For congestion: take a daily anti-histamine like Zyrtec, Claritin, and a oral decongestant, such as pseudoephedrine.  You can also use Flonase 1-2 sprays in each nostril daily. ?  ?It is important to stay hydrated: drink plenty of fluids (water, gatorade/powerade/pedialyte, juices, or teas) to keep your throat moisturized and help further  relieve irritation/discomfort.  ? ?

## 2021-04-25 NOTE — ED Provider Notes (Addendum)
?[place] ? ? ? ?CSN: [id number] ?Arrival date & time: 04/25/21  0807 ? ? ?  ? ?History   ?Chief Complaint ?Chief Complaint  ?Patient presents with  ? Fever  ? ? ?HPI ?Faizan Demontrae Befort is a 36 y.o. male.  ? ?Presents with fever, nasal congestion, rhinorrhea, sore throat, diarrhea, nonproductive cough and intermittent shortness of breath for 4 days. Last episode of diarrhea 1 hour ago.  Tolerating food and liquids.  No known sick contacts.  Endorses that he was seen in the urgent care 4 days ago prior to symptoms beginning.  Currently taking Augmentin for dental infection. ? ?Past Medical History:  ?Diagnosis Date  ? HSV-1 infection   ? ? ?Patient Active Problem List  ? Diagnosis Date Noted  ? AKI (acute kidney injury) ([place])   ? Syncope 11/06/2020  ? CONSTIPATION 06/30/2007  ? ? ?History reviewed. No pertinent surgical history. ? ? ? ? ?Home Medications   ? ?Prior to Admission medications   ?Medication Sig Start Date End Date Taking? Authorizing Provider  ?amoxicillin (AMOXIL) 500 MG capsule Take 1 capsule (500 mg total) by mouth 3 (three) times daily for 7 days. 04/22/21 04/29/21  Leath-Warren, Alda Lea, NP  ?cetirizine (ZYRTEC ALLERGY) 10 MG tablet Take 1 tablet (10 mg total) by mouth daily. 04/07/21   Petrucelli, Samantha R, PA-C  ?ibuprofen (ADVIL) 800 MG tablet Take 1 tablet (800 mg total) by mouth every 8 (eight) hours as needed for up to 10 days for moderate pain. 04/22/21 05/02/21  Leath-Warren, Alda Lea, NP  ? ? ?Family History ?Family History  ?Problem Relation Age of Onset  ? Healthy Mother   ? Healthy Father   ? ? ?Social History ?Social History  ? ?Tobacco Use  ? Smoking status: Former  ?  Types: Cigarettes, Cigars  ?  Quit date: 01/10/2019  ?  Years since quitting: 2.2  ? Smokeless tobacco: Never  ? Tobacco comments:  ?  black and mild 2x/week  ?Vaping Use  ? Vaping Use: Never used  ?Substance Use Topics  ? Alcohol use: Yes  ?  Comment: occ  ? Drug use: No  ? ? ? ?Allergies   ?Patient has  no known allergies. ? ? ?Review of Systems ?Review of Systems  ?Constitutional:  Positive for fever. Negative for activity change, appetite change, chills, diaphoresis, fatigue and unexpected weight change.  ?HENT:  Positive for congestion, rhinorrhea and sore throat. Negative for dental problem, drooling, ear discharge, ear pain, facial swelling, hearing loss, mouth sores, nosebleeds, postnasal drip, sinus pressure, sinus pain, sneezing, tinnitus, trouble swallowing and voice change.   ?Eyes: Negative.   ?Respiratory:  Positive for cough and shortness of breath. Negative for apnea, choking, chest tightness, wheezing and stridor.   ?Cardiovascular: Negative.   ?Gastrointestinal:  Positive for diarrhea. Negative for abdominal distention, abdominal pain, anal bleeding, blood in stool, constipation, nausea, rectal pain and vomiting.  ?Skin: Negative.   ?Neurological: Negative.   ? ? ?Physical Exam ?Triage Vital Signs ?ED Triage Vitals  ?Enc Vitals Group  ?   BP 04/25/21 0819 112/71  ?   Pulse Rate 04/25/21 0818 100  ?   Resp 04/25/21 0818 (!) 22  ?   Temp 04/25/21 0818 (!) 103 ?F (39.4 ?C)  ?   Temp src --   ?   SpO2 04/25/21 0818 100 %  ?   Weight --   ?   Height --   ?   Head Circumference --   ?  Peak Flow --   ?   Pain Score 04/25/21 0817 10  ?   Pain Loc --   ?   Pain Edu? --   ?   Excl. in [place]? --   ? ?No data found. ? ?Updated Vital Signs ?BP 112/71   Pulse 100   Temp (!) 103 ?F (39.4 ?C)   Resp (!) 22   SpO2 100%  ? ?Visual Acuity ?Right Eye Distance:   ?Left Eye Distance:   ?Bilateral Distance:   ? ?Right Eye Near:   ?Left Eye Near:    ?Bilateral Near:    ? ?Physical Exam ?Constitutional:   ?   Appearance: Normal appearance.  ?HENT:  ?   Head: Normocephalic.  ?   Right Ear: Tympanic membrane, ear canal and external ear normal.  ?   Left Ear: Ear canal and external ear normal.  ?   Nose: Congestion and rhinorrhea present.  ?   Mouth/Throat:  ?   Mouth: Mucous membranes are moist.  ?   Pharynx: Posterior  oropharyngeal erythema present.  ?Eyes:  ?   Extraocular Movements: Extraocular movements intact.  ?Cardiovascular:  ?   Rate and Rhythm: Normal rate and regular rhythm.  ?   Pulses: Normal pulses.  ?   Heart sounds: Normal heart sounds.  ?Pulmonary:  ?   Effort: Pulmonary effort is normal.  ?   Breath sounds: Normal breath sounds.  ?Musculoskeletal:  ?   Cervical back: Normal range of motion and neck supple.  ?Skin: ?   General: Skin is warm and dry.  ?Neurological:  ?   Mental Status: He is alert and oriented to person, place, and time. Mental status is at baseline.  ?Psychiatric:     ?   Mood and Affect: Mood normal.     ?   Behavior: Behavior normal.  ? ? ? ?UC Treatments / Results  ?Labs ?(all labs ordered are listed, but only abnormal results are displayed) ?Labs Reviewed - No data to display ? ?EKG ? ? ?Radiology ?No results found. ? ?Procedures ?Procedures (including critical care time) ? ?Medications Ordered in UC ?Medications  ?acetaminophen (TYLENOL) tablet 975 mg (975 mg Oral Given 04/25/21 0825)  ? ? ?Initial Impression / Assessment and Plan / UC Course  ?I have reviewed the triage vital signs and the nursing notes. ? ?Pertinent labs & imaging results that were available during my care of the patient were reviewed by me and considered in my medical decision making (see chart for details). ? ?Illness ? ?Vital signs are stable with a fever of 103 noted in triage, Tylenol given in office, patient is in no signs of distress, stable for outpatient treatment, etiology of symptoms are most likely viral as they started after recent urgent care visit, patient is currently taking Augmentin which will cover for upper respiratory bacteria, discussed this with patient and no changes will be made to antibiotic coverage, COVID test is pending, if positive patient may receive antiviral treatment as he endorses that he is" miserable". patient prescribed Flexeril as body aches are most concerning symptom today, offered  albuterol inhaler for shortness of breath which he declined recommended over-the-counter medications for further management of symptoms, urgent care follow-up as needed, work note given for 3 days, patient requesting to be out of work until next Wednesday, discussed capabilities of urgent care and how this can not be completed, verbalized understanding ?Final Clinical Impressions(s) / UC Diagnoses  ? ?Final diagnoses:  ?None  ? ?  Discharge Instructions   ?None ?  ? ?ED Prescriptions   ?None ?  ? ?PDMP not reviewed this encounter. ?  ?Hans Eden, NP ?04/25/21 [id number] ? ?  ?Hans Eden, NP ?04/25/21 [id number] ? ?

## 2021-04-28 ENCOUNTER — Telehealth (HOSPITAL_COMMUNITY): Payer: Self-pay | Admitting: Emergency Medicine

## 2021-04-28 NOTE — Telephone Encounter (Signed)
Pt called requesting additional work note due to Positive covid results.  ?

## 2021-06-10 ENCOUNTER — Ambulatory Visit (HOSPITAL_COMMUNITY)
Admission: EM | Admit: 2021-06-10 | Discharge: 2021-06-10 | Disposition: A | Discharge status: Home / Self Care | Payer: Self-pay | Attending: Family Medicine | Admitting: Family Medicine

## 2021-06-10 ENCOUNTER — Encounter (HOSPITAL_COMMUNITY): Payer: Self-pay

## 2021-06-10 DIAGNOSIS — R369 Urethral discharge, unspecified: Secondary | ICD-10-CM | POA: Insufficient documentation

## 2021-06-10 DIAGNOSIS — Z711 Person with feared health complaint in whom no diagnosis is made: Secondary | ICD-10-CM | POA: Insufficient documentation

## 2021-06-10 MED ORDER — DOXYCYCLINE HYCLATE 100 MG PO CAPS: 100.0000 mg | ORAL_CAPSULE | Freq: Two times a day (BID) | ORAL | 0 refills | Status: DC

## 2021-06-10 MED ORDER — CEFTRIAXONE SODIUM 500 MG IJ SOLR
INTRAMUSCULAR | Status: AC
  Filled 2021-06-10: qty 500

## 2021-06-10 MED ORDER — LIDOCAINE HCL (PF) 1 % IJ SOLN
INTRAMUSCULAR | Status: AC
  Filled 2021-06-10: qty 2

## 2021-06-10 MED ORDER — CEFTRIAXONE SODIUM 500 MG IJ SOLR
500.0000 mg | Freq: Once | INTRAMUSCULAR | Status: AC
  Administered 2021-06-10: 500 mg via INTRAMUSCULAR

## 2021-06-10 NOTE — ED Provider Notes (Signed)
?[facility] ? ? ?[id number] ?06/10/21 Arrival Time: 1326 ? ?ASSESSMENT & PLAN: ? ?1. Penile discharge   ?2. Concern about STD in male without diagnosis   ? ? ? ? ?Discharge Instructions   ? ?  ?You have been given the following today for treatment of suspected gonorrhea and/or chlamydia: ? ?cefTRIAXone (ROCEPHIN) injection 500 mg ? ?Please pick up your prescription for doxycycline 100 mg and begin taking twice daily for the next seven (7) days. ? ?Even though we have treated you today, we have sent testing for sexually transmitted infections. We will notify you of any positive results once they are received. If required, we will prescribe any medications you might need. ? ?Please refrain from all sexual activity for at least the next seven days. ? ? ? ? ?Pending: ?Labs Reviewed  ?CYTOLOGY, (ORAL, ANAL, URETHRAL) ANCILLARY ONLY  ? ? ?Will notify of any positive results. Instructed to refrain from sexual activity for at least seven days. ? ?Reviewed expectations re: course of current medical issues. Questions answered. ?Outlined signs and symptoms indicating need for more acute intervention. ?Patient verbalized understanding. ?After Visit Summary given. ? ? ?SUBJECTIVE: ? ?Danny Mccoy is a 36 y.o. male who presents with complaint of penile discharge. Onset gradual. First noticed  few d ago . No blood. No specific aggravating or alleviating factors reported. Denies: urinary frequency and gross hematuria. Ques slight burn with urination. Afebrile. No abdominal or pelvic pain. No n/v. No rashes or lesions. Reports that he is sexually active with single male partner. ?OTC treatment: none. ? ?OBJECTIVE: ? ?Vitals:  ? 06/10/21 1432  ?BP: 127/78  ?Pulse: 78  ?Resp: 18  ?Temp: 98.6 ?F (37 ?C)  ?TempSrc: Oral  ?SpO2: 98%  ?  ? ?General appearance: alert, cooperative, appears stated age and no distress ?Throat: lips, mucosa, and tongue normal; teeth and gums normal ?Lungs: unlabored respirations; speaks  full sentences without difficulty ?Back: no CVA tenderness; FROM at waist ?Abdomen: soft, non-tender ?GU: deferred ?Skin: warm and dry ?Psychological: alert and cooperative; normal mood and affect. ? ? ? ?Labs Reviewed  ?CYTOLOGY, (ORAL, ANAL, URETHRAL) ANCILLARY ONLY  ? ? ?No Known Allergies ? ?Past Medical History:  ?Diagnosis Date  ? HSV-1 infection   ? ?Family History  ?Problem Relation Age of Onset  ? Healthy Mother   ? Healthy Father   ? ?Social History  ? ?Socioeconomic History  ? Marital status: Single  ?  Spouse name: Not on file  ? Number of children: Not on file  ? Years of education: Not on file  ? Highest education level: Not on file  ?Occupational History  ? Not on file  ?Tobacco Use  ? Smoking status: Former  ?  Types: Cigarettes, Cigars  ?  Quit date: 01/10/2019  ?  Years since quitting: 2.4  ? Smokeless tobacco: Never  ? Tobacco comments:  ?  black and mild 2x/week  ?Vaping Use  ? Vaping Use: Never used  ?Substance and Sexual Activity  ? Alcohol use: Yes  ?  Comment: occ  ? Drug use: No  ? Sexual activity: Yes  ?  Birth control/protection: Condom  ?Other Topics Concern  ? Not on file  ?Social History Narrative  ? Not on file  ? ?Social Determinants of Health  ? ?Financial Resource Strain: Not on file  ?Food Insecurity: Not on file  ?Transportation Needs: Not on file  ?Physical Activity: Not on file  ?Stress: Not on file  ?Social Connections: Not  on file  ?Intimate Partner Violence: Not on file  ? ? ? ? ? ? ? ?  ?Mardella Layman, MD ?06/10/21 1456 ? ?

## 2021-06-10 NOTE — Discharge Instructions (Signed)

## 2021-06-10 NOTE — ED Triage Notes (Signed)
1wk h/o penile discharge and burning w/urination. No lesions. Pt reports sxs started approx 2-3 days after receiving oral sex from a woman. ?

## 2021-06-11 LAB — CYTOLOGY, (ORAL, ANAL, URETHRAL) ANCILLARY ONLY
Chlamydia: NEGATIVE
Comment: NEGATIVE
Comment: NEGATIVE
Comment: NORMAL
Neisseria Gonorrhea: NEGATIVE
Trichomonas: NEGATIVE

## 2021-08-06 ENCOUNTER — Encounter (HOSPITAL_COMMUNITY): Payer: Self-pay | Admitting: Emergency Medicine

## 2021-08-06 ENCOUNTER — Ambulatory Visit (HOSPITAL_COMMUNITY)
Admission: EM | Admit: 2021-08-06 | Discharge: 2021-08-06 | Disposition: A | Discharge status: Home / Self Care | Payer: Self-pay | Attending: Physician Assistant | Admitting: Physician Assistant

## 2021-08-06 DIAGNOSIS — R369 Urethral discharge, unspecified: Secondary | ICD-10-CM | POA: Insufficient documentation

## 2021-08-06 DIAGNOSIS — Z113 Encounter for screening for infections with a predominantly sexual mode of transmission: Secondary | ICD-10-CM | POA: Insufficient documentation

## 2021-08-06 DIAGNOSIS — J029 Acute pharyngitis, unspecified: Secondary | ICD-10-CM | POA: Insufficient documentation

## 2021-08-06 LAB — HIV ANTIBODY (ROUTINE TESTING W REFLEX): HIV Screen 4th Generation wRfx: NONREACTIVE

## 2021-08-06 MED ORDER — CEFTRIAXONE SODIUM 500 MG IJ SOLR
INTRAMUSCULAR | Status: AC
  Filled 2021-08-06: qty 500

## 2021-08-06 MED ORDER — CEFTRIAXONE SODIUM 500 MG IJ SOLR
INTRAMUSCULAR | Status: AC
Start: 2021-08-06 — End: ?
  Filled 2021-08-06: qty 500

## 2021-08-06 MED ORDER — CEFTRIAXONE SODIUM 500 MG IJ SOLR
500.0000 mg | Freq: Once | INTRAMUSCULAR | Status: AC
  Administered 2021-08-06: 500 mg via INTRAMUSCULAR

## 2021-08-06 MED ORDER — DOXYCYCLINE HYCLATE 100 MG PO CAPS: 100.0000 mg | ORAL_CAPSULE | Freq: Two times a day (BID) | ORAL | 0 refills | Status: DC

## 2021-08-06 NOTE — Discharge Instructions (Addendum)
Advised to take the doxycycline 100 mg 1 twice a day until completed. Advised to abstain from any activities until antibiotic course is completed. Advised to continue ibuprofen 4 to 600 mg every 8 hours with food to help decrease the throat pain. Advised to follow-up with PCP or return to urgent care if symptoms fail to improve.

## 2021-08-06 NOTE — ED Triage Notes (Signed)
Pt reports clear penile discharge and sore throat x 4 days. States he believes his sore throat is relating to giving oral sex.

## 2021-08-06 NOTE — ED Provider Notes (Signed)
[facility]    CSN: [id number] Arrival date & time: 08/06/21  2229      History   Chief Complaint Chief Complaint  Patient presents with   Penile Discharge   Sore Throat    HPI Danny Mccoy is a 36 y.o. male.   36 year old male presents with penile discharge and sore throat.  Patient relates that he had unprotected intercourse and oral sex 3 days ago.  Patient relates that after having sex with his partner the next day he started having sore throat, painful swallowing, penile discharge, which is intermittently discolored.  Patient relates he is not having dysuria, no fever or chills.  Patient denies any respiratory symptoms.  Patient is concerned about having an STI of the mouth and of the genitals.  Patient relates that ibuprofen tends to work in relieving the pain of the sore throat. Patient relates he does not want HIV or RPR testing.  Patient also declines to have a throat swab performed.   Penile Discharge  Sore Throat    Past Medical History:  Diagnosis Date   HSV-1 infection     Patient Active Problem List   Diagnosis Date Noted   AKI (acute kidney injury) ([facility])    Syncope 11/06/2020   CONSTIPATION 06/30/2007    History reviewed. No pertinent surgical history.     Home Medications    Prior to Admission medications   Medication Sig Start Date End Date Taking? Authorizing Provider  doxycycline (VIBRAMYCIN) 100 MG capsule Take 1 capsule (100 mg total) by mouth 2 (two) times daily. 08/06/21  Yes Ellsworth Lennox, PA-C  cetirizine (ZYRTEC ALLERGY) 10 MG tablet Take 1 tablet (10 mg total) by mouth daily. 04/07/21   Petrucelli, Samantha R, PA-C  cyclobenzaprine (FLEXERIL) 5 MG tablet Take 1 tablet (5 mg total) by mouth 3 (three) times daily as needed for muscle spasms. 04/25/21   Valinda Hoar, NP    Family History Family History  Problem Relation Age of Onset   Healthy Mother    Healthy Father     Social History Social History    Tobacco Use   Smoking status: Former    Types: Cigarettes, Cigars    Quit date: 01/10/2019    Years since quitting: 2.5   Smokeless tobacco: Never   Tobacco comments:    black and mild 2x/week  Vaping Use   Vaping Use: Never used  Substance Use Topics   Alcohol use: Yes    Comment: occ   Drug use: No     Allergies   Patient has no known allergies.   Review of Systems Review of Systems  HENT:  Positive for sore throat.   Genitourinary:  Positive for penile discharge.     Physical Exam Triage Vital Signs ED Triage Vitals  Enc Vitals Group     BP 08/06/21 0918 111/88     Pulse Rate 08/06/21 0918 91     Resp 08/06/21 0918 18     Temp 08/06/21 0918 97.9 F (36.6 C)     Temp Source 08/06/21 0918 Oral     SpO2 08/06/21 0918 96 %     Weight --      Height --      Head Circumference --      Peak Flow --      Pain Score 08/06/21 0917 0     Pain Loc --      Pain Edu? --      Excl. in [place]? --  No data found.  Updated Vital Signs BP 111/88 (BP Location: Left Arm)   Pulse 91   Temp 97.9 F (36.6 C) (Oral)   Resp 18   SpO2 96%   Visual Acuity Right Eye Distance:   Left Eye Distance:   Bilateral Distance:    Right Eye Near:   Left Eye Near:    Bilateral Near:     Physical Exam Constitutional:      Appearance: He is well-developed.  HENT:     Mouth/Throat:     Mouth: Mucous membranes are moist.     Pharynx: Oropharynx is clear. No pharyngeal swelling or posterior oropharyngeal erythema.  Abdominal:     General: Abdomen is flat. Bowel sounds are normal.     Palpations: Abdomen is soft.     Tenderness: There is no abdominal tenderness.  Genitourinary:    Comments: Genitals: There are no penile lesions present, no discharge noted.  Normal appearance of the penile shaft and the scrotum. Neurological:     Mental Status: He is alert.      UC Treatments / Results  Labs (all labs ordered are listed, but only abnormal results are displayed) Labs  Reviewed  CYTOLOGY, (ORAL, ANAL, URETHRAL) ANCILLARY ONLY    EKG   Radiology No results found.  Procedures Procedures (including critical care time)  Medications Ordered in UC Medications  cefTRIAXone (ROCEPHIN) injection 500 mg (has no administration in time range)    Initial Impression / Assessment and Plan / UC Course  I have reviewed the triage vital signs and the nursing notes.  Pertinent labs & imaging results that were available during my care of the patient were reviewed by me and considered in my medical decision making (see chart for details).    Plan: 1.  Patient will be given Rocephin 500 mg IM.  This will treat presumed gonorrhea infection. 2.  Patient be given doxycycline 100 mg twice daily for 7 days to treat presumed chlamydia. 3.  Patient advised to follow-up with PCP or return if symptoms fail to improve. Final Clinical Impressions(s) / UC Diagnoses   Final diagnoses:  Pharyngitis, unspecified etiology  Penile discharge  Routine screening for STI (sexually transmitted infection)     Discharge Instructions      Advised to take the doxycycline 100 mg 1 twice a day until completed. Advised to abstain from any activities until antibiotic course is completed. Advised to continue ibuprofen 4 to 600 mg every 8 hours with food to help decrease the throat pain. Advised to follow-up with PCP or return to urgent care if symptoms fail to improve.    ED Prescriptions     Medication Sig Dispense Auth. Provider   doxycycline (VIBRAMYCIN) 100 MG capsule Take 1 capsule (100 mg total) by mouth 2 (two) times daily. 20 capsule Ellsworth Lennox, PA-C      PDMP not reviewed this encounter.   Ellsworth Lennox, PA-C 08/06/21 [phone]

## 2021-08-07 LAB — CYTOLOGY, (ORAL, ANAL, URETHRAL) ANCILLARY ONLY
Chlamydia: NEGATIVE
Comment: NEGATIVE
Comment: NEGATIVE
Comment: NORMAL
Neisseria Gonorrhea: NEGATIVE
Trichomonas: NEGATIVE

## 2021-08-07 LAB — RPR: RPR Ser Ql: NONREACTIVE

## 2021-08-24 IMAGING — DX DG CHEST 2V
2 series · 2 of 2 positions shown · non-contrast
Comparison: PA and lateral chest 10/15/2019.

CLINICAL DATA: Shortness of breath and fatigue for 1 week. The
patient is C6H68-4C positive.

EXAM:
CHEST - 2 VIEW

[chest pa]
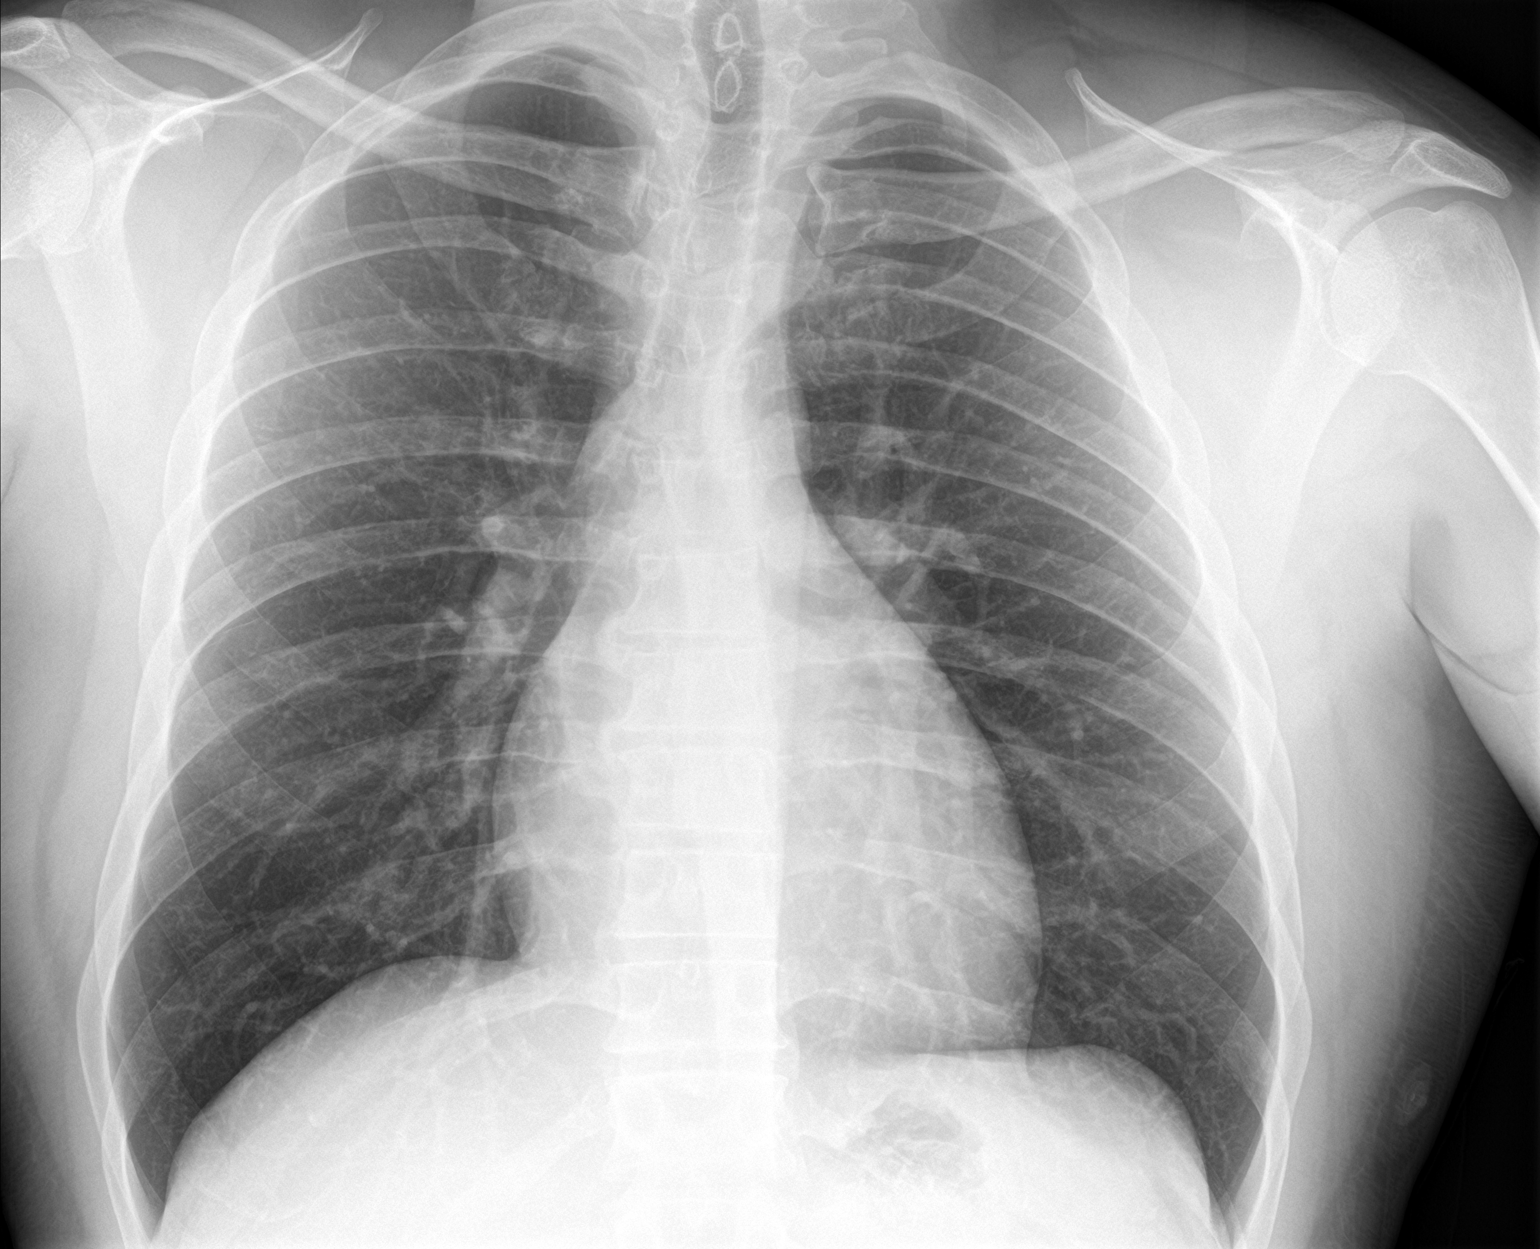

[chest lat]
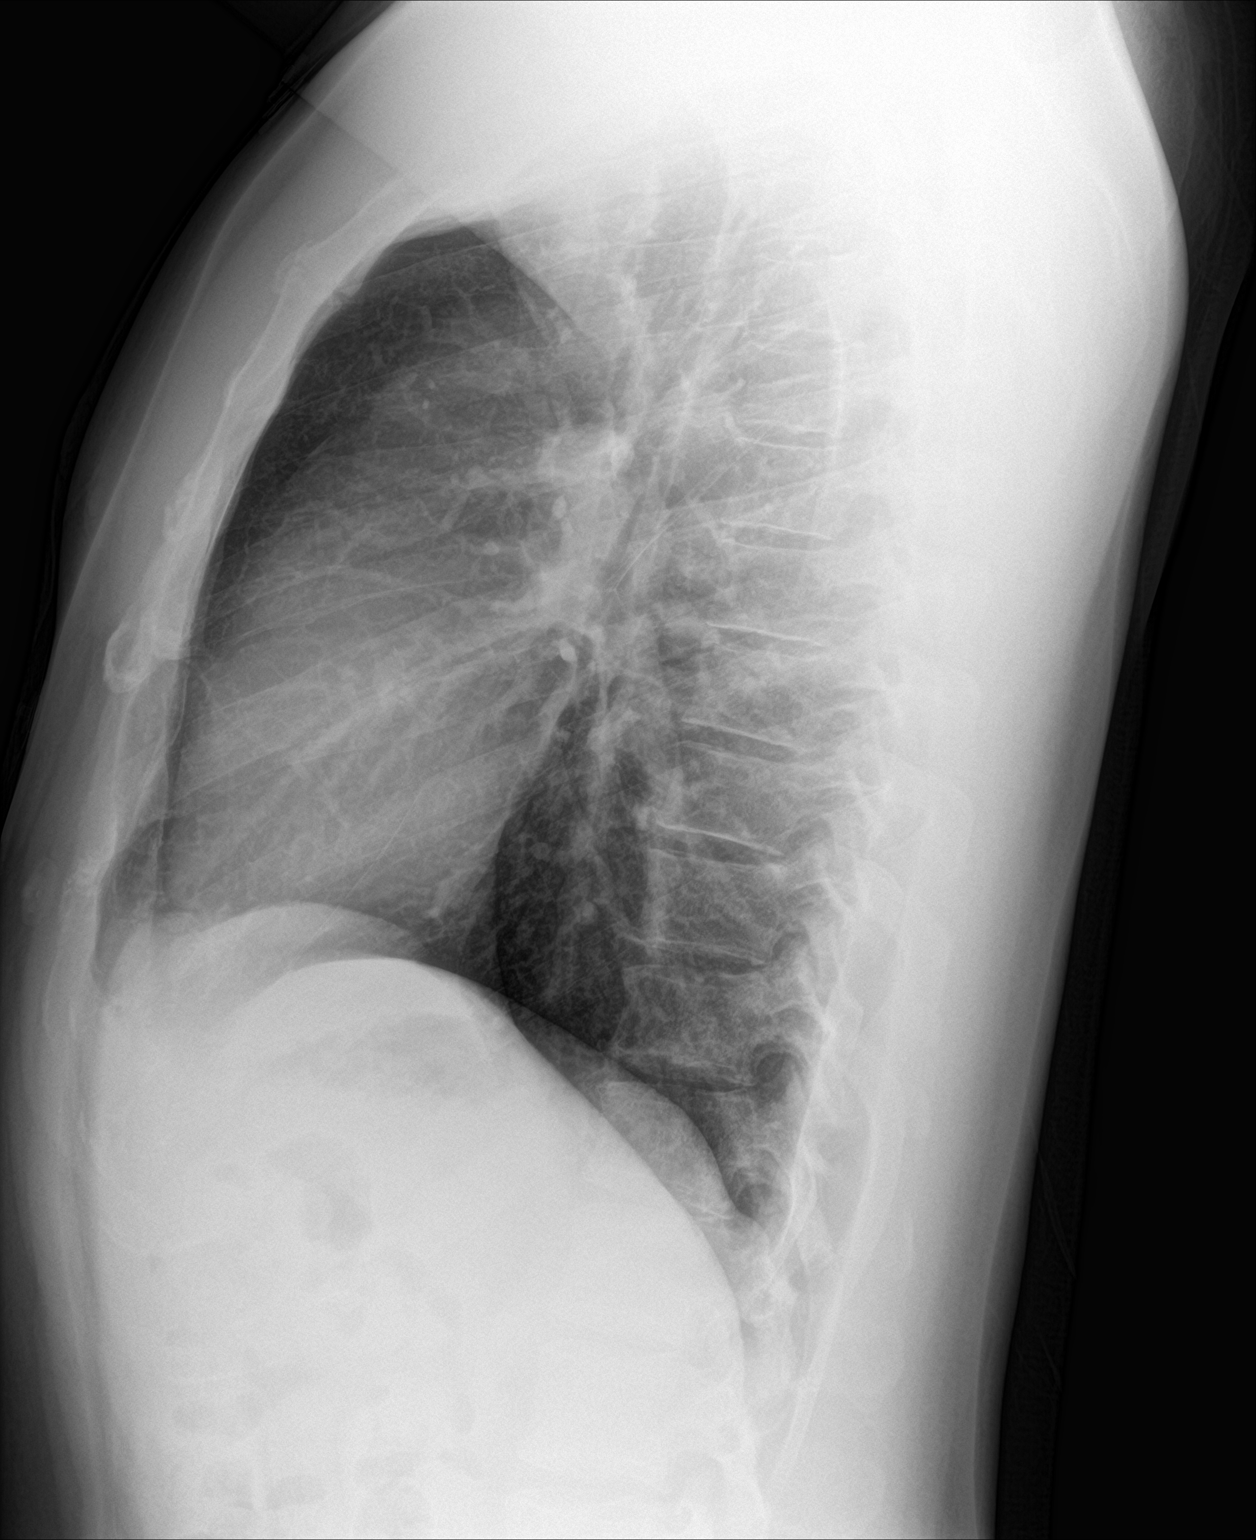

[2 of 2 positions shown; findings below may reference images not displayed]

FINDINGS: Lungs clear. Heart size normal. No pneumothorax or pleural fluid. No
bony abnormality.
IMPRESSION: Normal exam.

## 2021-10-13 ENCOUNTER — Encounter (HOSPITAL_COMMUNITY): Payer: Self-pay | Admitting: *Deleted

## 2021-10-13 ENCOUNTER — Ambulatory Visit (HOSPITAL_COMMUNITY)
Admission: EM | Admit: 2021-10-13 | Discharge: 2021-10-13 | Disposition: A | Discharge status: Home / Self Care | Payer: Self-pay | Attending: Emergency Medicine | Admitting: Emergency Medicine

## 2021-10-13 DIAGNOSIS — Z20822 Contact with and (suspected) exposure to covid-19: Secondary | ICD-10-CM | POA: Insufficient documentation

## 2021-10-13 DIAGNOSIS — R059 Cough, unspecified: Secondary | ICD-10-CM | POA: Insufficient documentation

## 2021-10-13 DIAGNOSIS — J069 Acute upper respiratory infection, unspecified: Secondary | ICD-10-CM | POA: Insufficient documentation

## 2021-10-13 DIAGNOSIS — J302 Other seasonal allergic rhinitis: Secondary | ICD-10-CM | POA: Insufficient documentation

## 2021-10-13 LAB — SARS CORONAVIRUS 2 BY RT PCR: SARS Coronavirus 2 by RT PCR: NEGATIVE

## 2021-10-13 LAB — POC INFLUENZA A AND B ANTIGEN (URGENT CARE ONLY)
INFLUENZA A ANTIGEN, POC: NEGATIVE
INFLUENZA B ANTIGEN, POC: NEGATIVE

## 2021-10-13 MED ORDER — PHENYLEPHRINE HCL 5 MG PO TABS: 5.0000 mg | ORAL_TABLET | ORAL | 0 refills | Status: DC | PRN

## 2021-10-13 NOTE — ED Triage Notes (Signed)
C/O runny nose, cough, difficulty sleeping onset 2 days ago without sore throat or fevers.

## 2021-10-13 NOTE — Discharge Instructions (Addendum)
Your flu test is negative. We will call you if the covid test is positive.   You can take the phenylephrine every 4 hours as needed for congestion and cough. You can add benadryl at night time as needed.  It may take a few more days for your symptoms to improve. Increase your fluid intake as much as tolerated.

## 2021-10-13 NOTE — ED Provider Notes (Signed)
[facility]    CSN: [id number] Arrival date & time: 10/13/21  1324      History   Chief Complaint Chief Complaint  Patient presents with   Cough    HPI Akul Kimberley Dastrup is a 36 y.o. male.  Presents with 2-day history of runny nose, cough. Reports he tried a NyQuil the first day and Mucinex the second day. Reports no relief from medication. Reports history of seasonal allergies but this feels worse  Denies fever, chills, sore throat, abdominal pain, GI or urinary symptoms. No known sick contacts.  Past Medical History:  Diagnosis Date   HSV-1 infection     Patient Active Problem List   Diagnosis Date Noted   AKI (acute kidney injury) ([facility])    Syncope 11/06/2020   CONSTIPATION 06/30/2007    History reviewed. No pertinent surgical history.     Home Medications    Prior to Admission medications   Medication Sig Start Date End Date Taking? Authorizing Provider  cetirizine (ZYRTEC ALLERGY) 10 MG tablet Take 1 tablet (10 mg total) by mouth daily. 04/07/21  Yes Petrucelli, Samantha R, PA-C  guaiFENesin (MUCINEX PO) Take by mouth.   Yes [provider]  Phenylephrine HCl 5 MG TABS Take 1 tablet (5 mg total) by mouth every 4 (four) hours as needed. 10/13/21  Yes Kyeshia Zinn, Lurena Joiner, PA-C    Family History Family History  Problem Relation Age of Onset   Healthy Mother    Healthy Father     Social History Social History   Tobacco Use   Smoking status: Never   Smokeless tobacco: Never   Tobacco comments:    black and mild 2x/week  Vaping Use   Vaping Use: Never used  Substance Use Topics   Alcohol use: Not Currently   Drug use: Never     Allergies   Patient has no known allergies.   Review of Systems Review of Systems  Respiratory:  Positive for cough.    Per HPI  Physical Exam Triage Vital Signs ED Triage Vitals  Enc Vitals Group     BP 10/13/21 1416 133/64     Pulse Rate 10/13/21 1414 71     Resp 10/13/21 1414 16      Temp 10/13/21 1414 98.6 F (37 C)     Temp Source 10/13/21 1414 Oral     SpO2 10/13/21 1414 99 %     Weight --      Height --      Head Circumference --      Peak Flow --      Pain Score 10/13/21 1415 0     Pain Loc --      Pain Edu? --      Excl. in [place]? --    No data found.  Updated Vital Signs BP 133/64   Pulse 71   Temp 98.6 F (37 C) (Oral)   Resp 16   SpO2 99%    Physical Exam Vitals and nursing note reviewed.  Constitutional:      General: He is not in acute distress. HENT:     Nose: No congestion.     Mouth/Throat:     Mouth: Mucous membranes are moist.     Pharynx: Uvula midline. No posterior oropharyngeal erythema.     Tonsils: No tonsillar exudate or tonsillar abscesses.  Eyes:     Conjunctiva/sclera: Conjunctivae normal.  Cardiovascular:     Rate and Rhythm: Normal rate and regular rhythm.  Pulses: Normal pulses.     Heart sounds: Normal heart sounds.  Pulmonary:     Effort: Pulmonary effort is normal. No respiratory distress.     Breath sounds: Normal breath sounds. No wheezing.  Musculoskeletal:     Cervical back: Normal range of motion.  Lymphadenopathy:     Cervical: No cervical adenopathy.  Neurological:     Mental Status: He is alert and oriented to person, place, and time.      UC Treatments / Results  Labs (all labs ordered are listed, but only abnormal results are displayed) Labs Reviewed  SARS CORONAVIRUS 2 BY RT PCR  POC INFLUENZA A AND B ANTIGEN (URGENT CARE ONLY)    EKG   Radiology No results found.  Procedures Procedures (including critical care time)  Medications Ordered in UC Medications - No data to display  Initial Impression / Assessment and Plan / UC Course  I have reviewed the triage vital signs and the nursing notes.  Pertinent labs & imaging results that were available during my care of the patient were reviewed by me and considered in my medical decision making (see chart for details).  He is  well-appearing, afebrile. Could be viral etiology.  Flu was negative in clinic, COVID is pending.  Recommend trying consistent medicine such as Mucinex twice daily or phenylephrine every 4 hours, as its only been two days of symptoms and he may need more than one dose.  He can add Benadryl at nighttime.  Discussed that it may take a day or two to start to feel better.  Patient was requesting a strong medicine to cover all his symptoms.  I discussed with him that there is no one medicine to make him feel better right away, he will have to try symptomatic care and find what works for him. Discussed phenylephrine use and how it can work very well for some people. Work note provided. Return precautions discussed. Patient agrees to plan  Final Clinical Impressions(s) / UC Diagnoses   Final diagnoses:  Viral URI     Discharge Instructions      Your flu test is negative. We will call you if the covid test is positive.   You can take the phenylephrine every 4 hours as needed for congestion and cough. You can add benadryl at night time as needed.  It may take a few more days for your symptoms to improve. Increase your fluid intake as much as tolerated.     ED Prescriptions     Medication Sig Dispense Auth. Provider   Phenylephrine HCl 5 MG TABS Take 1 tablet (5 mg total) by mouth every 4 (four) hours as needed. 30 tablet Izael Bessinger, Lurena Joiner, PA-C      PDMP not reviewed this encounter.   Darnise Montag, Lurena Joiner, [place] 10/13/21 1619

## 2021-10-14 ENCOUNTER — Other Ambulatory Visit: Payer: Self-pay

## 2021-10-14 ENCOUNTER — Emergency Department (HOSPITAL_COMMUNITY)
Admission: EM | Admit: 2021-10-14 | Discharge: 2021-10-14 | Disposition: A | Discharge status: Home / Self Care | Payer: Self-pay | Attending: Emergency Medicine | Admitting: Emergency Medicine

## 2021-10-14 ENCOUNTER — Encounter (HOSPITAL_COMMUNITY): Payer: Self-pay | Admitting: Emergency Medicine

## 2021-10-14 DIAGNOSIS — J45909 Unspecified asthma, uncomplicated: Secondary | ICD-10-CM | POA: Insufficient documentation

## 2021-10-14 DIAGNOSIS — Z20822 Contact with and (suspected) exposure to covid-19: Secondary | ICD-10-CM | POA: Insufficient documentation

## 2021-10-14 DIAGNOSIS — J069 Acute upper respiratory infection, unspecified: Secondary | ICD-10-CM | POA: Insufficient documentation

## 2021-10-14 LAB — SARS CORONAVIRUS 2 BY RT PCR: SARS Coronavirus 2 by RT PCR: NEGATIVE

## 2021-10-14 NOTE — Discharge Instructions (Addendum)
You will need to take some over-the-counter Delsym, or expectorant.  You may also take some Robitussin to help with your cough.  Your symptoms will likely self resolve in the next week.  Experience any chest pain, fevers you may return to the emergency department.

## 2021-10-14 NOTE — ED Provider Triage Note (Signed)
Emergency Medicine Provider Triage Evaluation Note  Danny Mccoy , a 36 y.o. male  was evaluated in triage.  Pt complains of nasal congestion, runny nose, loss of taste.  Today is day 2 of symptoms.  He states that he had a COVID test performed yesterday which was negative.  He has had some cough but no shortness of breath.  He has had a history of COVID and has had vaccines.  No history of asthma or other breathing problems.  Review of Systems  Positive: Nasal congestion, cough Negative: Fever  Physical Exam  BP 132/86 (BP Location: Right Arm)   Pulse 62   Temp 98.1 F (36.7 C) (Oral)   Resp 16   SpO2 97%  Gen:   Awake, no distress   Resp:  Normal effort  MSK:   Moves extremities without difficulty  Other:  Lungs clear to auscultation bilaterally, posterior pharynx normal  Medical Decision Making  Medically screening exam initiated at 11:46 AM.  Appropriate orders placed.  Danny Mccoy was informed that the remainder of the evaluation will be completed by another provider, this initial triage assessment does not replace that evaluation, and the importance of remaining in the ED until their evaluation is complete.     Renne Crigler, PA-C 10/14/21 1147

## 2021-10-14 NOTE — ED Provider Notes (Signed)
[facility] EMERGENCY DEPARTMENT Provider Note   CSN: [id number] Arrival date & time: 10/14/21  1111     History  Chief Complaint  Patient presents with   Nasal Congestion    Donald Avory Mimbs is a 36 y.o. male.  36 year old male with past medical history presents to the ED with a chief complaint of nasal congestion, runny nose, loss of taste which began approximately 3 days ago.  Patient was evaluated urgent care yesterday had a negative COVID test, negative flu test.  He was sent home with symptomatic treatment.  Today he returns for worsening symptoms, with nasal congestion and unable to get any sleep.  He is requesting " something that knocks me out ".  He denies any shortness of breath, has not had any cough..  Denies any fever, no prior history of asthma, no other complaints.  The history is provided by the patient.       Home Medications Prior to Admission medications   Medication Sig Start Date End Date Taking? Authorizing Provider  cetirizine (ZYRTEC ALLERGY) 10 MG tablet Take 1 tablet (10 mg total) by mouth daily. 04/07/21   Petrucelli, Samantha R, PA-C  guaiFENesin (MUCINEX PO) Take by mouth.    [provider]  Phenylephrine HCl 5 MG TABS Take 1 tablet (5 mg total) by mouth every 4 (four) hours as needed. 10/13/21   Rising, Lurena Joiner, PA-C      Allergies    Patient has no known allergies.    Review of Systems   Review of Systems  Constitutional:  Negative for fever.  HENT:  Positive for rhinorrhea, sinus pressure and sinus pain. Negative for sore throat and trouble swallowing.   Respiratory:  Negative for cough, shortness of breath and wheezing.   Cardiovascular:  Negative for chest pain.  Gastrointestinal:  Negative for abdominal pain.    Physical Exam Updated Vital Signs BP 132/86 (BP Location: Right Arm)   Pulse 62   Temp 98.1 F (36.7 C) (Oral)   Resp 16   Ht 6' (1.829 m)   Wt 93.9 kg   SpO2 97%   BMI 28.07 kg/m   Physical Exam Vitals and nursing note reviewed.  Constitutional:      Appearance: Normal appearance.  HENT:     Head: Normocephalic and atraumatic.     Nose: Congestion and rhinorrhea present.  Eyes:     Pupils: Pupils are equal, round, and reactive to light.  Cardiovascular:     Rate and Rhythm: Normal rate.  Pulmonary:     Effort: Pulmonary effort is normal.     Breath sounds: No wheezing or rales.  Abdominal:     General: Abdomen is flat.     Tenderness: There is no abdominal tenderness.  Musculoskeletal:     Cervical back: Normal range of motion and neck supple.  Skin:    General: Skin is warm and dry.  Neurological:     Mental Status: He is alert and oriented to person, place, and time.     ED Results / Procedures / Treatments   Labs (all labs ordered are listed, but only abnormal results are displayed) Labs Reviewed  SARS CORONAVIRUS 2 BY RT PCR    EKG None  Radiology No results found.  Procedures Procedures    Medications Ordered in ED Medications - No data to display  ED Course/ Medical Decision Making/ A&P  Medical Decision Making   Presents to the ED with runny nose, congestion for the past 3 days.  Evaluated urgent care yesterday and advised to continue conservative treatment.  Negative flu, negative COVID.  During evaluation he is hemodynamically stable, no hypoxia, no tachycardia or tachypnea.  His lungs are clear to auscultation without any wheezing, rhonchi or rales.  No prior history of asthma.  No pain along his chest.  Does have congestion and rhinorrhea but no sinus pressure, symptoms have been ongoing for the past 3 days.  I discussed with him conservative treatment.  He did have another COVID test obtained today which is currently pending however will not change treatment at this time as patient has no comorbidities and would not qualify for an antiviral.  In addition agrees to management, return precautions  discussed at length.  Patient stable for discharge.    Portions of this note were generated with Scientist, clinical (histocompatibility and immunogenetics). Dictation errors may occur despite best attempts at proofreading.  Final Clinical Impression(s) / ED Diagnoses Final diagnoses:  Viral upper respiratory tract infection    Rx / DC Orders ED Discharge Orders     None         Claude Manges, PA-C 10/14/21 1244    Gloris Manchester, MD 10/15/21 1708

## 2021-10-14 NOTE — ED Triage Notes (Signed)
Patient c/o congestion and loss of taste since Sunday. Requesting to be checked for covid.

## 2021-10-16 ENCOUNTER — Ambulatory Visit (HOSPITAL_COMMUNITY)
Admission: EM | Admit: 2021-10-16 | Discharge: 2021-10-16 | Disposition: A | Discharge status: Home / Self Care | Payer: Self-pay

## 2021-10-16 ENCOUNTER — Other Ambulatory Visit: Payer: Self-pay

## 2021-10-16 ENCOUNTER — Encounter (HOSPITAL_COMMUNITY): Payer: Self-pay | Admitting: Emergency Medicine

## 2021-10-16 DIAGNOSIS — R202 Paresthesia of skin: Secondary | ICD-10-CM

## 2021-10-16 NOTE — Discharge Instructions (Signed)
Suspect there may be nerves getting pinched in your wrist or hand causing your numbness, however we do not have the testing here in urgent care to determine the cause of your numbness. Recommend follow-up with a PCP, hand specialist, or neurologist for further evaluation.

## 2021-10-16 NOTE — ED Provider Notes (Signed)
[facility]    CSN: [id number] Arrival date & time: 10/16/21  1604      History   Chief Complaint Chief Complaint  Patient presents with   Hand Problem    HPI Danny Mccoy is a 36 y.o. male.   Patient presents with concerns of bilateral hand numbness and tingling for over a year. He reports the tingling and numbness are constant and do not seem to worsen or change with use. He states the symptoms initially were intermittent but he started a job requiring a lot of hand use such as gripping and lifting and the symptoms have gotten worse and constant. He denies any pain or discomfort. 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 He has not seen anyone for this or tried anything for it.   The history is provided by the patient.    Past Medical History:  Diagnosis Date   HSV-1 infection     Patient Active Problem List   Diagnosis Date Noted   AKI (acute kidney injury) (HCC)    Syncope 11/06/2020   CONSTIPATION 06/30/2007    History reviewed. No pertinent surgical history.     Home Medications    Prior to Admission medications   Medication Sig Start Date End Date Taking? Authorizing Provider  cetirizine (ZYRTEC ALLERGY) 10 MG tablet Take 1 tablet (10 mg total) by mouth daily. Patient not taking: Reported on 10/16/2021 04/07/21   Petrucelli, Lelon Mast R, PA-C  guaiFENesin (MUCINEX PO) Take by mouth. Patient not taking: Reported on 10/16/2021    [provider]  Phenylephrine HCl 5 MG TABS Take 1 tablet (5 mg total) by mouth every 4 (four) hours as needed. Patient not taking: Reported on 10/16/2021 10/13/21   Rising, Lurena Joiner, PA-C    Family History Family History  Problem Relation Age of Onset   Healthy Mother    Healthy Father     Social History Social History   Tobacco Use   Smoking status: Never   Smokeless tobacco: Never   Tobacco comments:    black and mild 2x/week  Vaping Use   Vaping Use: Never  used  Substance Use Topics   Alcohol use: Not Currently   Drug use: Never     Allergies   Patient has no known allergies.   Review of Systems Review of Systems  Constitutional:  Negative for fever.  Musculoskeletal:  Negative for arthralgias, joint swelling and myalgias.  Skin:  Negative for color change and rash.  Neurological:  Positive for numbness. Negative for dizziness and weakness.     Physical Exam Triage Vital Signs ED Triage Vitals  Enc Vitals Group     BP 10/16/21 1617 105/72     Pulse Rate 10/16/21 1617 93     Resp 10/16/21 1617 18     Temp 10/16/21 1617 98.2 F (36.8 C)     Temp Source 10/16/21 1617 Oral     SpO2 10/16/21 1617 98 %     Weight --      Height --      Head Circumference --      Peak Flow --      Pain Score 10/16/21 1614 5     Pain Loc --      Pain Edu? --      Excl. in GC? --    No data found.  Updated Vital Signs BP 105/72 (BP Location: Right Arm) Comment (BP Location): large cuff  Pulse 93   Temp 98.2 F (36.8 C) (Oral)   Resp 18   SpO2 98%   Visual Acuity Right Eye Distance:   Left Eye Distance:   Bilateral Distance:    Right Eye Near:   Left Eye Near:    Bilateral Near:     Physical Exam Vitals and nursing note reviewed.  Constitutional:      General: He is not in acute distress. Cardiovascular:     Pulses: Normal pulses.  Pulmonary:     Effort: Pulmonary effort is normal.  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  No tenderness to wrists, hands, or fingers. Full ROM wrists and fingers without discomfort.   Skin:    Findings: No erythema or rash.  Neurological:     Mental Status: He is alert.     Comments: Reports decreased sensation to entire bilateral thumb, 2, 3, 4, digits. Reports some decrease in sensation to distal palm and dorsal hand but intact sensation to more proximal aspects. Reports intact sensation 5 digits. No grip weakness  appreciated of fingers.   Psychiatric:        Mood and Affect: Mood normal.      UC Treatments / Results  Labs (all labs ordered are listed, but only abnormal results are displayed) Labs Reviewed - No data to display  EKG   Radiology No results found.  Procedures Procedures (including critical care time)  Medications Ordered in UC Medications - No data to display  Initial Impression / Assessment and Plan / UC Course  I have reviewed the triage vital signs and the nursing notes.  Pertinent labs & imaging results that were available during my care of the patient were reviewed by me and considered in my medical decision making (see chart for details).     Paresthesias do not clearly follow median nerve to be consistent with carpal tunnel. Suspect there may be impingement, possible from additional less visible congenital deformities of hands/fingers. Cannot rule out other causes of paresthesias, though some element of compressions would be more consistent with worsening with increased use with new job. Discussed with patient limits of urgent care to evaluate chronic concerns such as this and recommended establishing with PCP and seeing a hand specialist or neurologist for further evaluation such as EMG. No other sx to indicate possible systemic cause.   E/M: 1 stable chronic illness, no data, low risk   Final Clinical Impressions(s) / UC Diagnoses   Final diagnoses:  Paresthesia of both hands     Discharge Instructions      Suspect there may be nerves getting pinched in your wrist or hand causing your numbness, however we do not have the testing here in urgent care to determine the cause of your numbness. Recommend follow-up with a PCP, hand specialist, or neurologist for further evaluation.     ED Prescriptions   None    PDMP not reviewed this encounter.   Estanislado Pandy, [place] 10/16/21 1643

## 2021-10-16 NOTE — ED Triage Notes (Signed)
Complains of tingling in both hands for a year .  No pcp.  Episodes are more frequent now.  Patient reports numbness involves all fingers except little finger.  Patient is not sure if issues are related to hand deformity found in both hands.  Symptoms are interfering with work

## 2021-10-27 IMAGING — CT CT HEAD W/O CM
4 series · 16 of 47 positions shown, 18 images · non-contrast
Comparison: 09/10/2004

CLINICAL DATA: Recurrent syncope, fall

EXAM:
CT HEAD WITHOUT CONTRAST
TECHNIQUE: Contiguous axial images were obtained from the base of the skull
through the vertex without intravenous contrast.

[Series 3: head wo · axial · 0.43mm/px · z∈[+1364,+1478]mm · 7 of 31 slices shown, 9 images]
[im 4/31  brain]
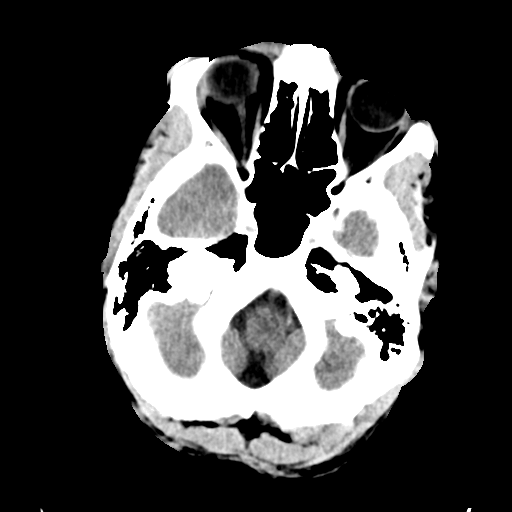
[im 4/31  bone]
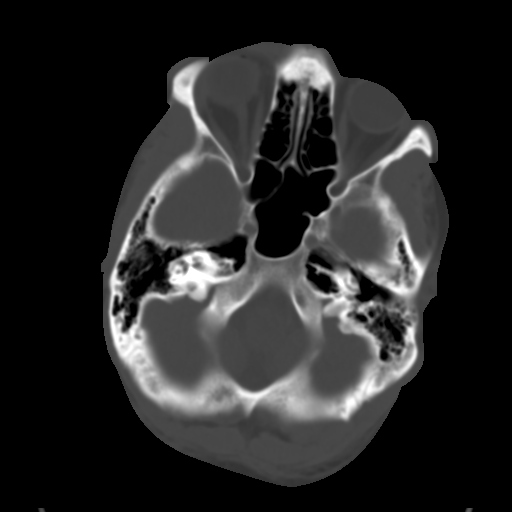
[im 8/31  brain]
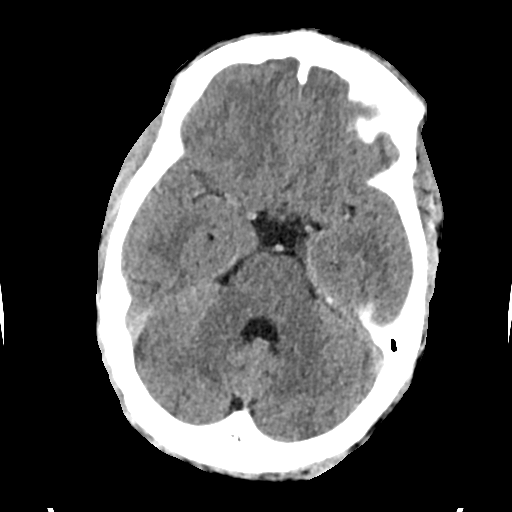
[im 12/31  brain]
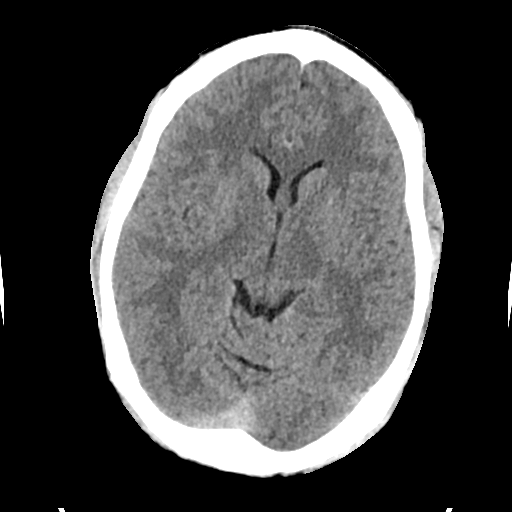
[im 16/31  brain]
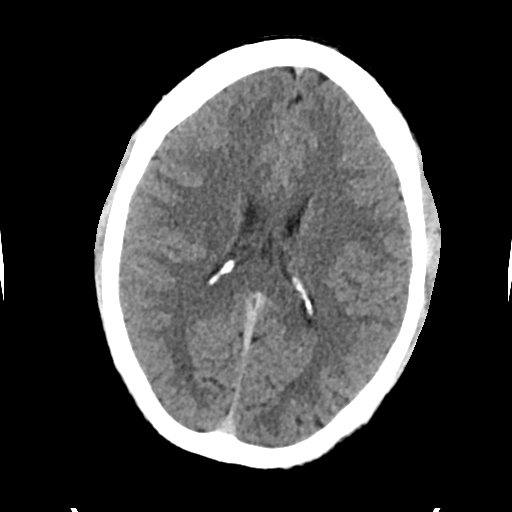
[im 19/31  brain]
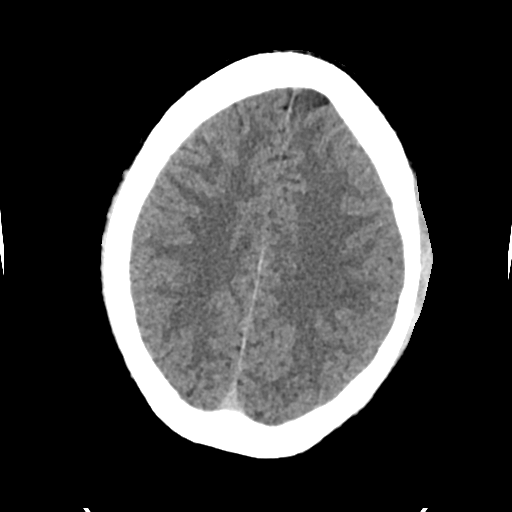
[im 19/31  bone]
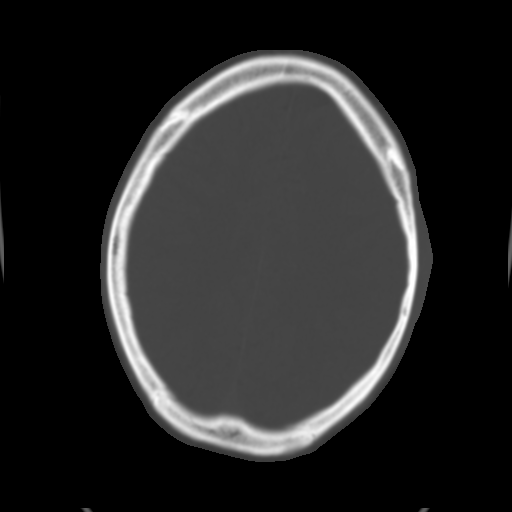
[im 23/31  brain]
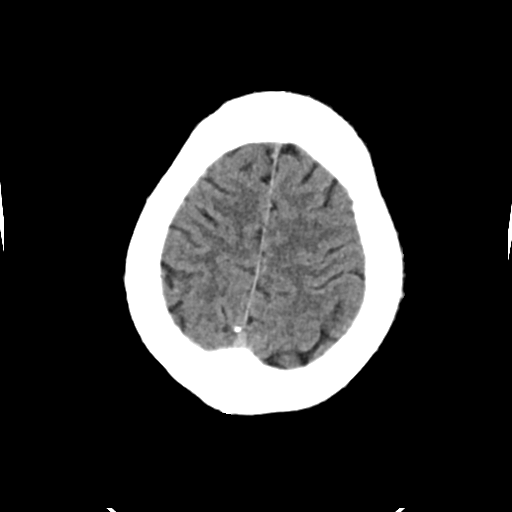
[im 27/31  brain]
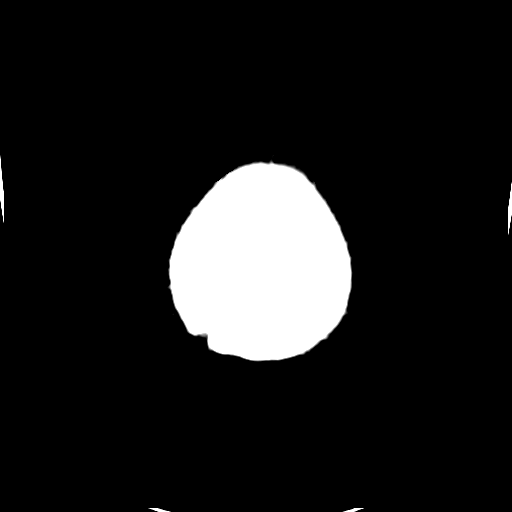

[Series 4: head bone · axial · 0.43mm/px · z∈[+1362,+1394]mm · 3 of 78 slices shown]
[im 8/78  bone]
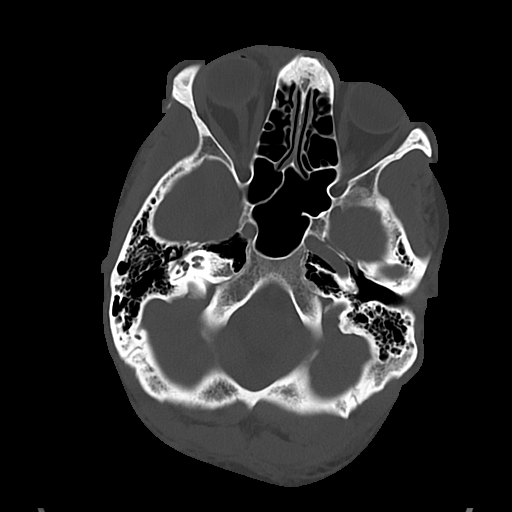
[im 16/78  bone]
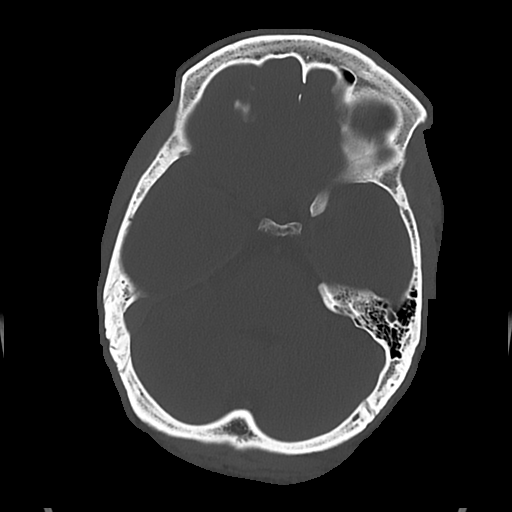
[im 24/78  bone]
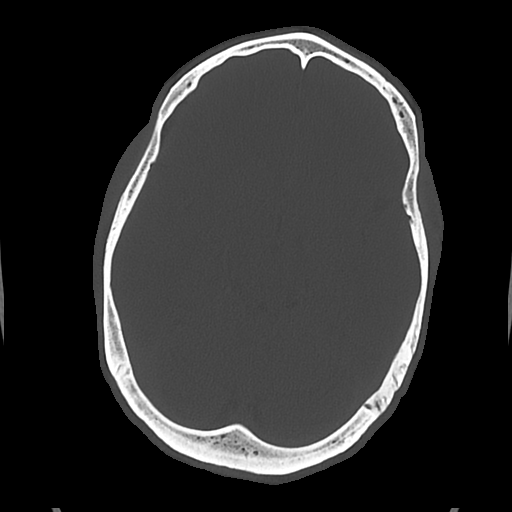

[Series 5: cor soft · coronal · 0.30mm/px · 3 of 69 slices shown]
[im 23/69  brain]
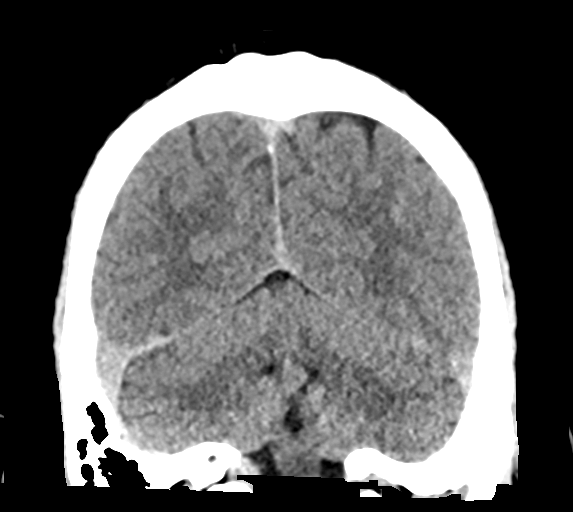
[im 31/69  brain]
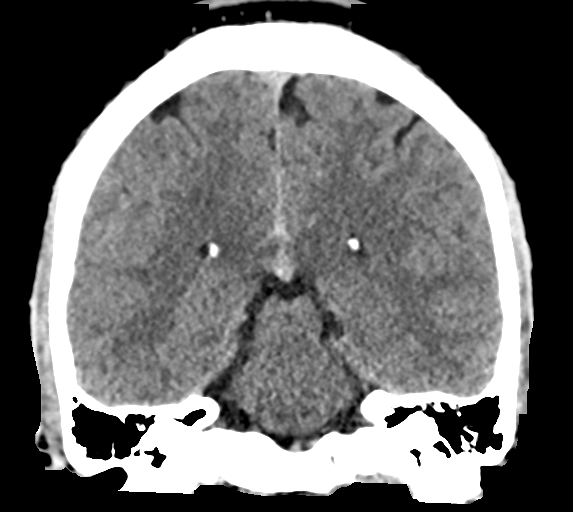
[im 38/69  brain]
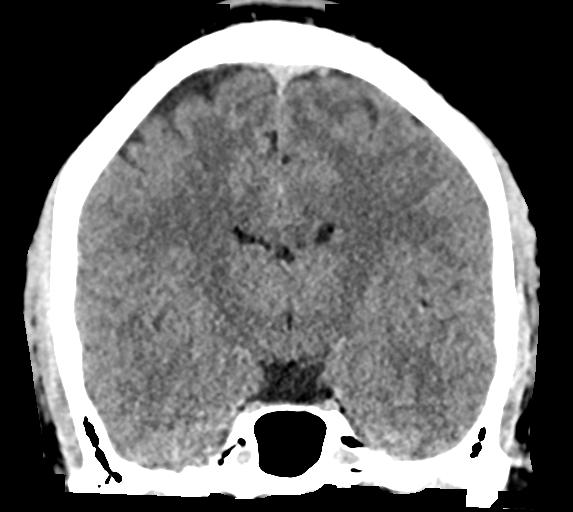

[Series 6: sag soft · sagittal · 0.32mm/px · 3 of 57 slices shown]
[im 19/57  brain]
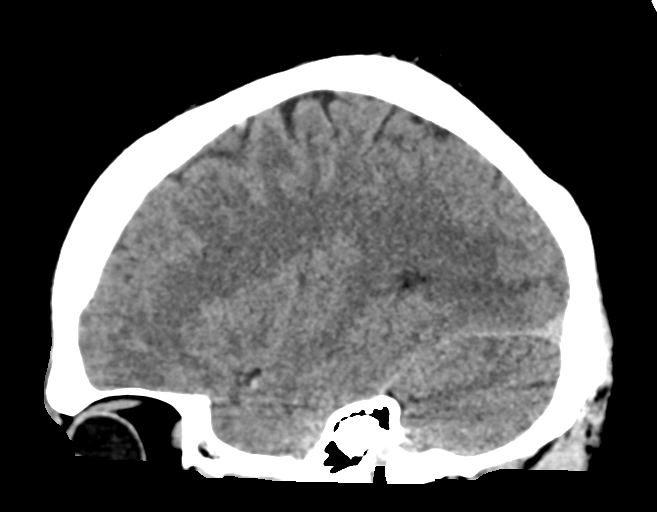
[im 29/57  brain]
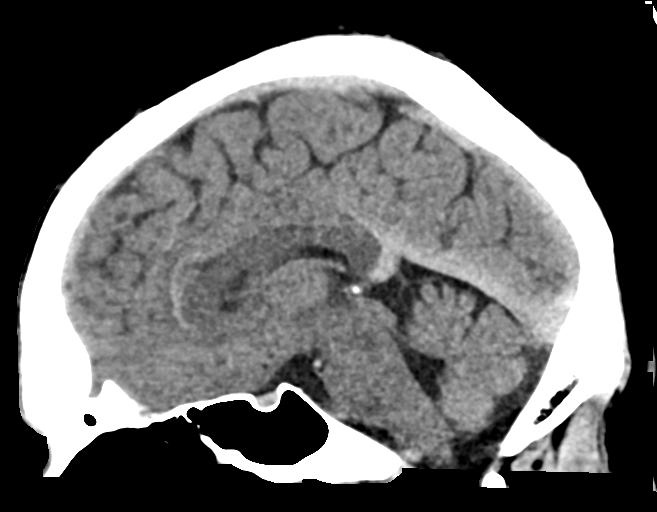
[im 38/57  brain]
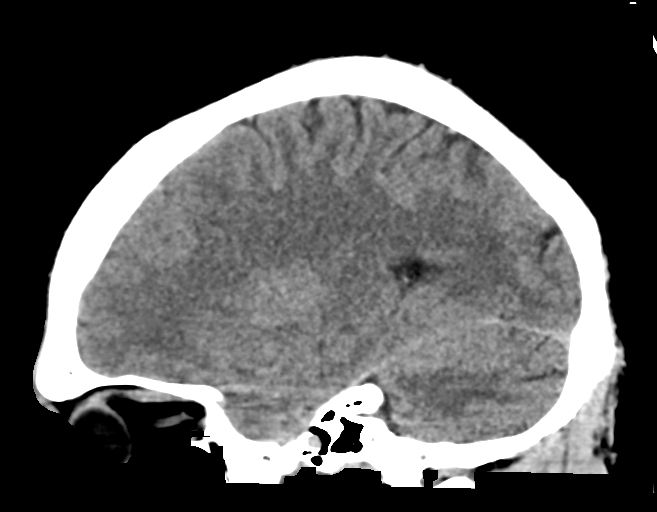

[16 of 47 positions shown; findings below may reference images not displayed]

FINDINGS: Brain: No evidence of acute infarction, hemorrhage, hydrocephalus,
extra-axial collection or mass lesion/mass effect.

Vascular: No hyperdense vessel or unexpected calcification.

Skull: Normal. Negative for fracture or focal lesion.

Sinuses/Orbits: The visualized paranasal sinuses are essentially
clear. The mastoid air cells are unopacified.

Other: None.
IMPRESSION: Normal head CT.

## 2021-10-28 IMAGING — CT CT ANGIO CHEST
2 of 6 series · 18 of 46 positions shown · IV contrast (APPLIED)
Comparison: Chest radiograph dated 09/02/2020

CLINICAL DATA: Syncope

EXAM:
CT ANGIOGRAPHY CHEST WITH CONTRAST
TECHNIQUE: Multidetector CT imaging of the chest was performed using the
standard protocol during bolus administration of intravenous
contrast. Multiplanar CT image reconstructions and MIPs were
obtained to evaluate the vascular anatomy.
CONTRAST:  69mL OMNIPAQUE IOHEXOL 350 MG/ML SOLN

[Series 10: thins · axial · 0.96mm/px · z∈[+520,+791]mm · 15 of 297 slices shown]
[im 13/297  lung]
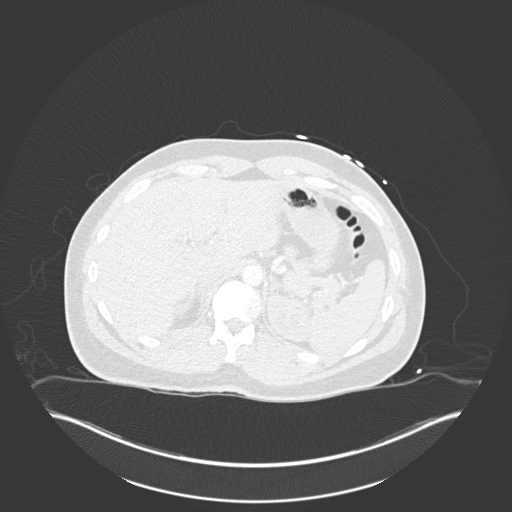
[im 39/297  soft-tissue]
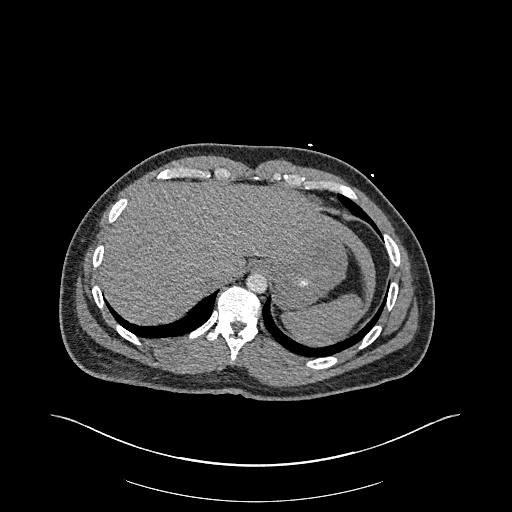
[im 52/297  lung]
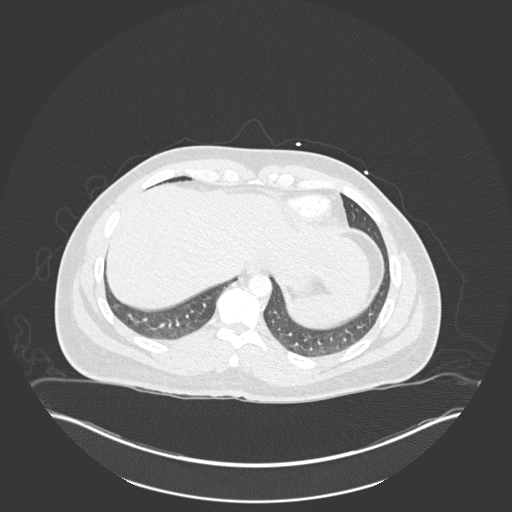
[im 78/297  soft-tissue]
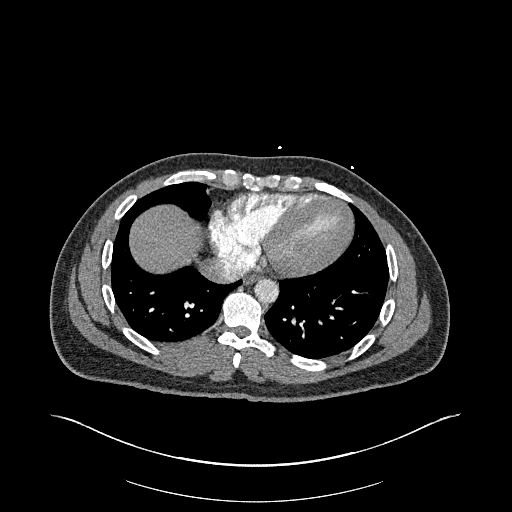
[im 91/297  lung]
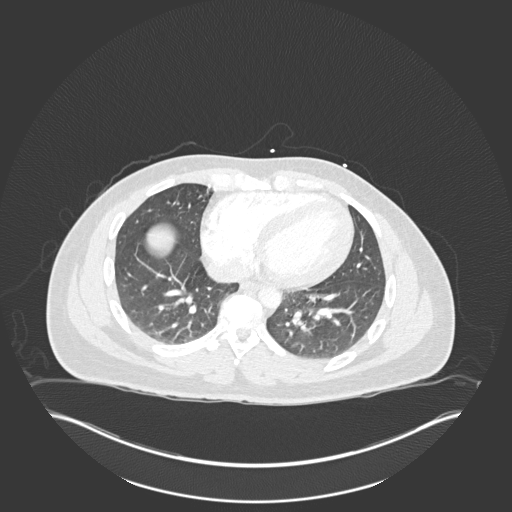
[im 116/297  soft-tissue]
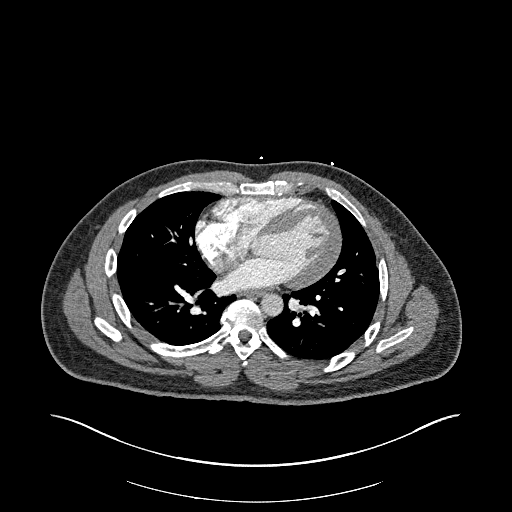
[im 129/297  lung]
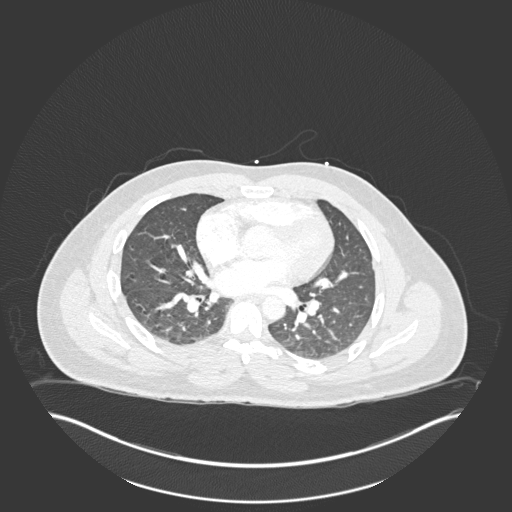
[im 155/297  soft-tissue]
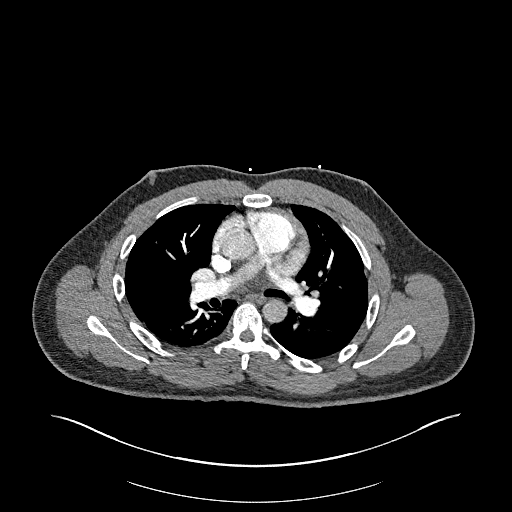
[im 168/297  lung]
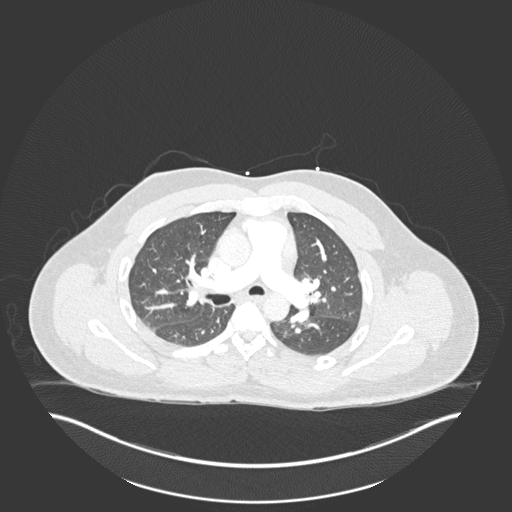
[im 181/297  soft-tissue]
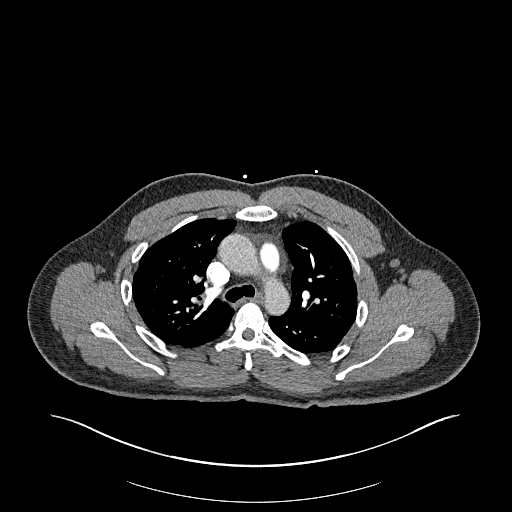
[im 206/297  lung]
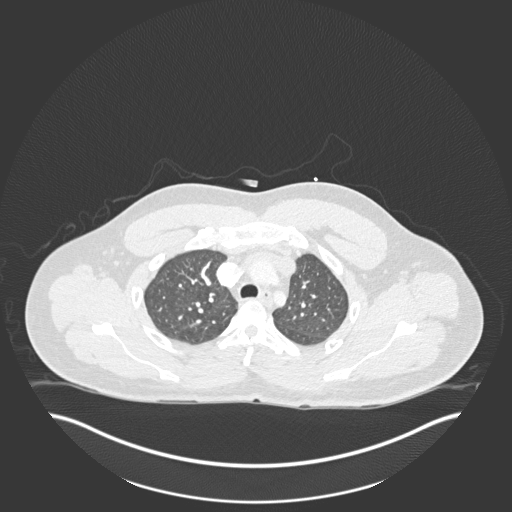
[im 219/297  soft-tissue]
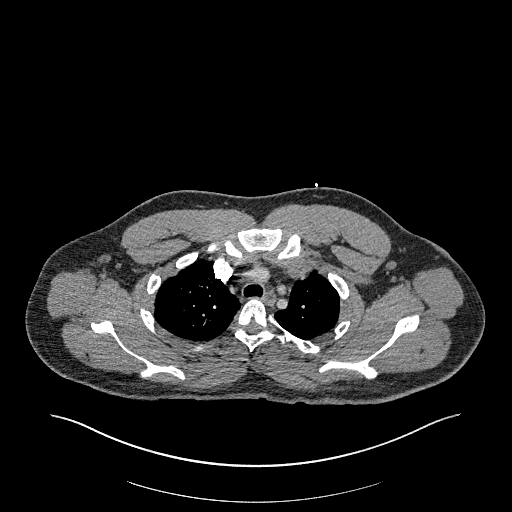
[im 245/297  lung]
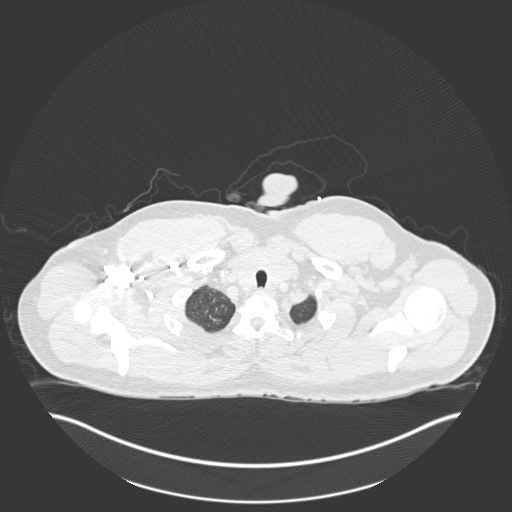
[im 258/297  soft-tissue]
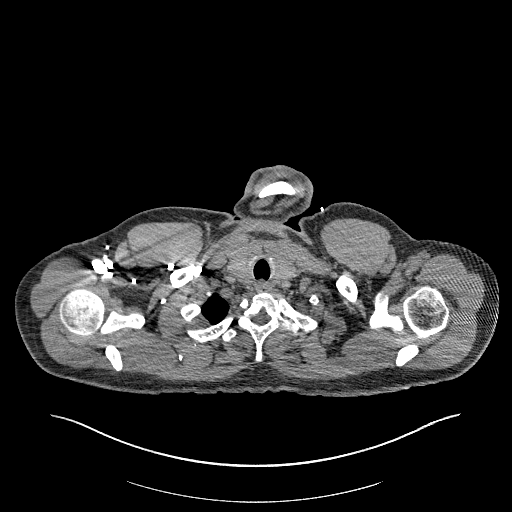
[im 284/297  lung]
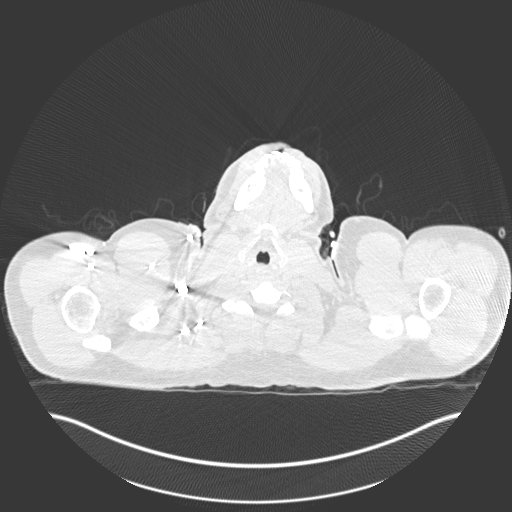

[Series 12: coronal mpr · coronal · 0.59mm/px · 3 of 123 slices shown]
[im 31/123  soft-tissue]
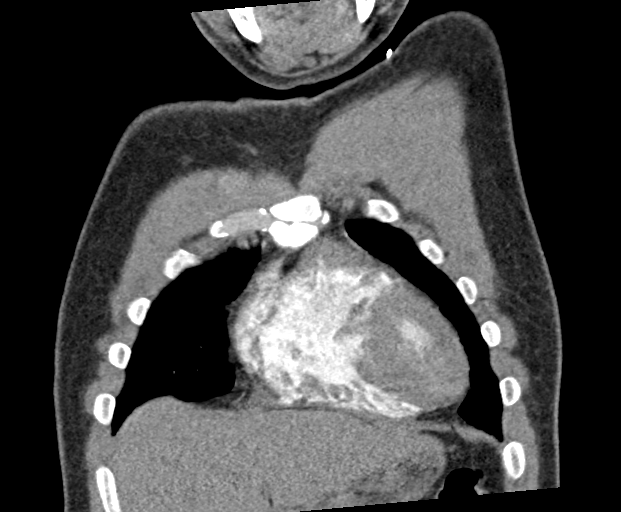
[im 62/123  soft-tissue]
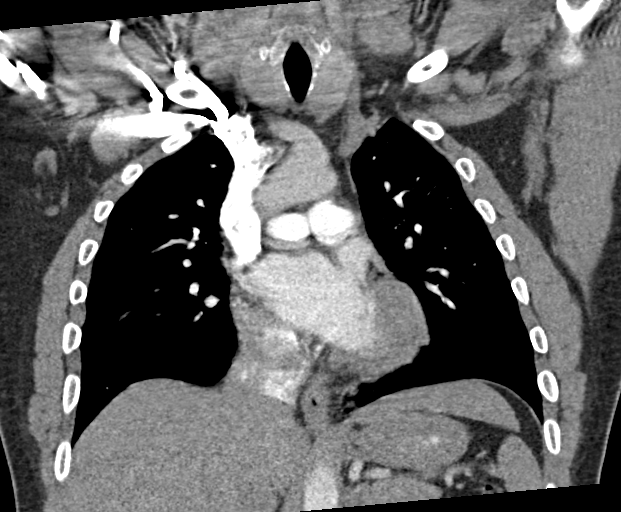
[im 92/123  soft-tissue]
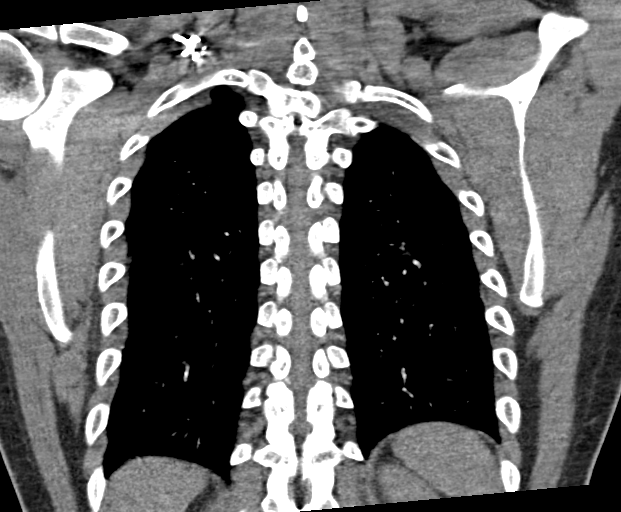

[18 of 46 positions shown; findings below may reference images not displayed]

FINDINGS: Cardiovascular: Satisfactory opacification of the bilateral
pulmonary arteries to the lobar level. No evidence of pulmonary
embolism.

Study is not tailored for evaluation of the thoracic aorta. No
evidence of thoracic aortic aneurysm.

The heart is normal in size.  No pericardial effusion.

Mediastinum/Nodes: No suspicious mediastinal lymphadenopathy.

Visualized thyroid is unremarkable.

Lungs/Pleura: Lungs are clear.

No suspicious pulmonary nodules.

No focal consolidation.

No pleural effusion or pneumothorax.

Upper Abdomen: Visualized upper abdomen is grossly unremarkable.

Musculoskeletal: Visualized osseous structures are within normal
limits.

Review of the MIP images confirms the above findings.
IMPRESSION: No evidence of pulmonary embolism.

Negative CT chest.

## 2021-11-18 ENCOUNTER — Other Ambulatory Visit: Payer: Self-pay

## 2021-11-18 ENCOUNTER — Ambulatory Visit (HOSPITAL_COMMUNITY)
Admission: EM | Admit: 2021-11-18 | Discharge: 2021-11-18 | Disposition: A | Discharge status: Home / Self Care | Payer: Self-pay | Attending: Family Medicine | Admitting: Family Medicine

## 2021-11-18 DIAGNOSIS — N3001 Acute cystitis with hematuria: Secondary | ICD-10-CM

## 2021-11-18 LAB — POCT URINALYSIS DIPSTICK, ED / UC
Glucose, UA: NEGATIVE mg/dL
Ketones, ur: 15 mg/dL — AB
Nitrite: POSITIVE — AB
Protein, ur: >=300 mg/dL — AB
Specific Gravity, Urine: 1.02 (ref 1.005–1.030)
Urobilinogen, UA: 1 mg/dL (ref 0.0–1.0)
pH: 8.5 — ABNORMAL HIGH (ref 5.0–8.0)

## 2021-11-18 MED ORDER — PHENAZOPYRIDINE HCL 200 MG PO TABS: 200.0000 mg | ORAL_TABLET | Freq: Three times a day (TID) | ORAL | 0 refills | Status: DC

## 2021-11-18 MED ORDER — NITROFURANTOIN MONOHYD MACRO 100 MG PO CAPS: 100.0000 mg | ORAL_CAPSULE | Freq: Two times a day (BID) | ORAL | 0 refills | Status: AC

## 2021-11-18 NOTE — ED Provider Notes (Signed)
[facility]    CSN: [id number] Arrival date & time: 11/18/21  1642      History   Chief Complaint Chief Complaint  Patient presents with   Dysuria   Urinary Frequency   Abdominal Pain    HPI Gadge Denman Pichardo is a 36 y.o. male.   Patient presents with dysuria, urinary frequency and urgency beginning 1 day ago, began to experience hematuria this morning.  Sexually active, 2 partners, sometimes condom use, no known exposures.  Denies abdominal pain, flank pain, fevers, chills, new rash or lesions.    Past Medical History:  Diagnosis Date   HSV-1 infection     Patient Active Problem List   Diagnosis Date Noted   AKI (acute kidney injury) (HCC)    Syncope 11/06/2020   CONSTIPATION 06/30/2007    No past surgical history on file.     Home Medications    Prior to Admission medications   Medication Sig Start Date End Date Taking? Authorizing Provider  cetirizine (ZYRTEC ALLERGY) 10 MG tablet Take 1 tablet (10 mg total) by mouth daily. Patient not taking: Reported on 10/16/2021 04/07/21   Petrucelli, Lelon Mast R, PA-C  guaiFENesin (MUCINEX PO) Take by mouth. Patient not taking: Reported on 10/16/2021    [provider]  Phenylephrine HCl 5 MG TABS Take 1 tablet (5 mg total) by mouth every 4 (four) hours as needed. Patient not taking: Reported on 10/16/2021 10/13/21   Rising, Lurena Joiner, PA-C    Family History Family History  Problem Relation Age of Onset   Healthy Mother    Healthy Father     Social History Social History   Tobacco Use   Smoking status: Never   Smokeless tobacco: Never   Tobacco comments:    black and mild 2x/week  Vaping Use   Vaping Use: Never used  Substance Use Topics   Alcohol use: Not Currently   Drug use: Never     Allergies   Patient has no known allergies.   Review of Systems Review of Systems  Genitourinary:  Positive for dysuria, frequency and hematuria. Negative for decreased urine volume, difficulty  urinating, enuresis, flank pain, genital sores, penile discharge, penile pain, penile swelling, scrotal swelling, testicular pain and urgency.     Physical Exam Triage Vital Signs ED Triage Vitals  Enc Vitals Group     BP 11/18/21 1745 130/87     Pulse Rate 11/18/21 1745 83     Resp 11/18/21 1745 20     Temp 11/18/21 1745 99.7 F (37.6 C)     Temp src --      SpO2 11/18/21 1745 98 %     Weight --      Height --      Head Circumference --      Peak Flow --      Pain Score 11/18/21 1744 6     Pain Loc --      Pain Edu? --      Excl. in GC? --    No data found.  Updated Vital Signs BP 130/87   Pulse 83   Temp 99.7 F (37.6 C)   Resp 20   SpO2 98%   Visual Acuity Right Eye Distance:   Left Eye Distance:   Bilateral Distance:    Right Eye Near:   Left Eye Near:    Bilateral Near:     Physical Exam Constitutional:      Appearance: Normal appearance. He is well-developed.  HENT:  Head: Normocephalic.  Eyes:     Extraocular Movements: Extraocular movements intact.  Pulmonary:     Effort: Pulmonary effort is normal.  Abdominal:     General: Abdomen is flat. Bowel sounds are normal.     Palpations: Abdomen is soft.     Tenderness: There is no right CVA tenderness or left CVA tenderness.  Genitourinary:    Comments: deferred Neurological:     Mental Status: He is alert and oriented to person, place, and time. Mental status is at baseline.      UC Treatments / Results  Labs (all labs ordered are listed, but only abnormal results are displayed) Labs Reviewed  POCT URINALYSIS DIPSTICK, ED / UC - Abnormal; Notable for the following components:      Result Value   Bilirubin Urine SMALL (*)    Ketones, ur 15 (*)    Hgb urine dipstick LARGE (*)    pH 8.5 (*)    Protein, ur >=300 (*)    Nitrite POSITIVE (*)    Leukocytes,Ua SMALL (*)    All other components within normal limits  URINE CULTURE    EKG   Radiology No results  found.  Procedures Procedures (including critical care time)  Medications Ordered in UC Medications - No data to display  Initial Impression / Assessment and Plan / UC Course  I have reviewed the triage vital signs and the nursing notes.  Pertinent labs & imaging results that were available during my care of the patient were reviewed by me and considered in my medical decision making (see chart for details).  Acute cystitis with hematuria  Urinalysis showing nitrates, leukocytes and hemoglobin, sent for culture, indicative of infection, discussed with patient, STI labs pending, will treat per protocol, Macrobid prescribed as well as Pyridium for additional support, recommended increase fluid intake and abstinence until all methods and testing have resulted, may follow-up with urgent care if symptoms continue to persist or worsen Final Clinical Impressions(s) / UC Diagnoses   Final diagnoses:  None     Discharge Instructions      Today you are being treated prophlyactically for a kidney stone. In the urgent care setting, I do not have definitive imaging to determine if a kidney stone is present. A x-ray will only show large stones meaning a stone could be present but it will not be seen. This decision was made based on your symptoms and urinalysis.  Your urinalysis showed blood but no infection   Take Tamsulosin on every day before bed for 14 days. This medication relaxes the urethra and will allow the stone to move through easier   You may use zofran every 6 hours as needed for nausea.  You may take..........Marland Kitchenevery 6 hours as needed for pain.   At any point if your pain becomes more severe, urine in blood begins or increases, you become lightheaded or dizzy please go to the nearest emergency department for further evaluation     ED Prescriptions   None    PDMP not reviewed this encounter.   Hans Eden, NP 11/18/21 [phone]

## 2021-11-18 NOTE — ED Triage Notes (Signed)
Pt reports dysuria, frequency, and  ABD  pain started yesterday.

## 2021-11-18 NOTE — Discharge Instructions (Addendum)
Your urinalysis shows Chea Malan blood cells and nitrates which are indicative of infection, your urine will be sent to the lab to determine exactly which bacteria is present, if any changes need to be made to your medications you will be notified  Begin use of Macrobid every morning and every evening for 7 days  You may use over-the-counter Pyridium every 8 hours to help minimize your symptoms until antibiotic removes bacteria, this medication will turn your urine orange  Increase your fluid intake through use of water  If symptoms continue to persist after use of medication or recur please follow-up with urgent care or your primary doctor as needed  Labs pending 2-3 days, you will be contacted if positive for any sti and treatment will be sent to the pharmacy, you will have to return to the clinic if positive for gonorrhea to receive treatment   Please refrain from having sex until labs results, if positive please refrain from having sex until treatment complete and symptoms resolve   If positive for , Chlamydia  gonorrhea or trichomoniasis please notify partner or partners so they may tested as well  Moving forward, it is recommended you use some form of protection against the transmission of sti infections  such as condoms or dental dams with each sexual encounter

## 2021-11-19 LAB — CYTOLOGY, (ORAL, ANAL, URETHRAL) ANCILLARY ONLY
Chlamydia: NEGATIVE
Comment: NEGATIVE
Comment: NEGATIVE
Comment: NORMAL
Neisseria Gonorrhea: NEGATIVE
Trichomonas: NEGATIVE

## 2021-11-20 LAB — URINE CULTURE: Culture: 80000 — AB

## 2021-12-04 ENCOUNTER — Ambulatory Visit (HOSPITAL_COMMUNITY)
Admission: EM | Admit: 2021-12-04 | Discharge: 2021-12-04 | Disposition: A | Discharge status: Home / Self Care | Payer: Self-pay | Attending: Emergency Medicine | Admitting: Emergency Medicine

## 2021-12-04 ENCOUNTER — Encounter (HOSPITAL_COMMUNITY): Payer: Self-pay

## 2021-12-04 DIAGNOSIS — N3001 Acute cystitis with hematuria: Secondary | ICD-10-CM

## 2021-12-04 LAB — POCT URINALYSIS DIPSTICK, ED / UC
Bilirubin Urine: NEGATIVE
Glucose, UA: NEGATIVE mg/dL
Ketones, ur: NEGATIVE mg/dL
Nitrite: POSITIVE — AB
Protein, ur: 100 mg/dL — AB
Specific Gravity, Urine: >=1.03 (ref 1.005–1.030)
Urobilinogen, UA: 0.2 mg/dL (ref 0.0–1.0)
pH: 5.5 (ref 5.0–8.0)

## 2021-12-04 MED ORDER — SULFAMETHOXAZOLE-TRIMETHOPRIM 800-160 MG PO TABS: 1.0000 | ORAL_TABLET | Freq: Two times a day (BID) | ORAL | 0 refills | Status: AC

## 2021-12-04 NOTE — ED Triage Notes (Signed)
Pt was seen on 11/18/2021 for dysuria. He was prescribed antibiotic but was not able to finish medication.

## 2021-12-04 NOTE — Discharge Instructions (Addendum)
Please take the medication twice daily for 7 days. Increase fluids to at least 64 oz daily.

## 2021-12-04 NOTE — ED Provider Notes (Signed)
[facility]    CSN: [id number] Arrival date & time: 12/04/21  8185      History   Chief Complaint Chief Complaint  Patient presents with   Follow-up    HPI Danny Mccoy is a 36 y.o. male.  Presents with continued dysuria and frequency Was seen 10/10 and prescribed Macrobid for acute cystitis Reports he took 3 days of the medicine Bottle was left in his car which was towed, he was unable to finish the course  Denies fever, bladder or abdominal pain, flank pain No penile discharge, testicular pain or swelling  Past Medical History:  Diagnosis Date   HSV-1 infection     Patient Active Problem List   Diagnosis Date Noted   AKI (acute kidney injury) (HCC)    Syncope 11/06/2020   CONSTIPATION 06/30/2007    History reviewed. No pertinent surgical history.     Home Medications    Prior to Admission medications   Medication Sig Start Date End Date Taking? Authorizing Provider  sulfamethoxazole-trimethoprim (BACTRIM DS) 800-160 MG tablet Take 1 tablet by mouth 2 (two) times daily for 7 days. 12/04/21 12/11/21 Yes Keairra Bardon, Lurena Joiner, PA-C  phenazopyridine (PYRIDIUM) 200 MG tablet Take 1 tablet (200 mg total) by mouth 3 (three) times daily. 11/18/21   Valinda Hoar, NP    Family History Family History  Problem Relation Age of Onset   Healthy Mother    Healthy Father     Social History Social History   Tobacco Use   Smoking status: Never   Smokeless tobacco: Never   Tobacco comments:    black and mild 2x/week  Vaping Use   Vaping Use: Never used  Substance Use Topics   Alcohol use: Not Currently   Drug use: Never     Allergies   Patient has no known allergies.   Review of Systems Review of Systems Per HPI  Physical Exam Triage Vital Signs ED Triage Vitals [12/04/21 0901]  Enc Vitals Group     BP 129/81     Pulse Rate (!) 59     Resp 16     Temp 98.1 F (36.7 C)     Temp Source Oral     SpO2 99 %     Weight       Height      Head Circumference      Peak Flow      Pain Score      Pain Loc      Pain Edu?      Excl. in GC?    No data found.  Updated Vital Signs BP 129/81 (BP Location: Left Arm)   Pulse (!) 59   Temp 98.1 F (36.7 C) (Oral)   Resp 16   SpO2 99%     Physical Exam Vitals and nursing note reviewed.  Constitutional:      General: He is not in acute distress.    Appearance: Normal appearance.  HENT:     Mouth/Throat:     Pharynx: Oropharynx is clear.  Cardiovascular:     Rate and Rhythm: Normal rate and regular rhythm.     Pulses: Normal pulses.     Heart sounds: Normal heart sounds.  Pulmonary:     Effort: Pulmonary effort is normal.     Breath sounds: Normal breath sounds.  Abdominal:     Palpations: Abdomen is soft.     Tenderness: There is no abdominal tenderness. There is no right CVA tenderness, left CVA  tenderness or guarding.  Neurological:     Mental Status: He is alert and oriented to person, place, and time.     UC Treatments / Results  Labs (all labs ordered are listed, but only abnormal results are displayed) Labs Reviewed  POCT URINALYSIS DIPSTICK, ED / UC - Abnormal; Notable for the following components:      Result Value   Hgb urine dipstick MODERATE (*)    Protein, ur 100 (*)    Nitrite POSITIVE (*)    Leukocytes,Ua SMALL (*)    All other components within normal limits  URINE CULTURE    EKG  Radiology No results found.  Procedures Procedures (including critical care time)  Medications Ordered in UC Medications - No data to display  Initial Impression / Assessment and Plan / UC Course  I have reviewed the triage vital signs and the nursing notes.  Pertinent labs & imaging results that were available during my care of the patient were reviewed by me and considered in my medical decision making (see chart for details).  Urine with positive nitrates, leuks and hgb. Elevated specific gravity. Will culture Treat with Bactrim BID x 7  days Increase water intake Return precautions discussed including ED precautions for worsening symptoms . Patient agrees to plan  Final Clinical Impressions(s) / UC Diagnoses   Final diagnoses:  Acute cystitis with hematuria     Discharge Instructions      Please take the medication twice daily for 7 days. Increase fluids to at least 64 oz daily.     ED Prescriptions     Medication Sig Dispense Auth. Provider   sulfamethoxazole-trimethoprim (BACTRIM DS) 800-160 MG tablet Take 1 tablet by mouth 2 (two) times daily for 7 days. 14 tablet Elika Godar, Wells Guiles, PA-C      PDMP not reviewed this encounter.   [place] Pou, [place] 12/04/21 6967

## 2021-12-15 ENCOUNTER — Encounter (HOSPITAL_COMMUNITY): Payer: Self-pay | Admitting: *Deleted

## 2021-12-15 ENCOUNTER — Ambulatory Visit (HOSPITAL_COMMUNITY)
Admission: EM | Admit: 2021-12-15 | Discharge: 2021-12-15 | Disposition: A | Discharge status: Home / Self Care | Payer: Self-pay | Attending: Internal Medicine | Admitting: Internal Medicine

## 2021-12-15 ENCOUNTER — Other Ambulatory Visit: Payer: Self-pay

## 2021-12-15 DIAGNOSIS — R300 Dysuria: Secondary | ICD-10-CM

## 2021-12-15 LAB — POCT URINALYSIS DIPSTICK, ED / UC
Glucose, UA: NEGATIVE mg/dL
Hgb urine dipstick: NEGATIVE
Ketones, ur: NEGATIVE mg/dL
Leukocytes,Ua: NEGATIVE
Nitrite: NEGATIVE
Protein, ur: 30 mg/dL — AB
Specific Gravity, Urine: 1.025 (ref 1.005–1.030)
Urobilinogen, UA: 1 mg/dL (ref 0.0–1.0)
pH: 6 (ref 5.0–8.0)

## 2021-12-15 MED ORDER — PHENAZOPYRIDINE HCL 200 MG PO TABS: 200.0000 mg | ORAL_TABLET | Freq: Three times a day (TID) | ORAL | 0 refills | Status: DC | PRN

## 2021-12-15 NOTE — ED Provider Notes (Signed)
[facility]    CSN: [id number] Arrival date & time: 12/15/21  1433      History   Chief Complaint Chief Complaint  Patient presents with   Dysuria    HPI Danny Mccoy is a 36 y.o. male comes to the urgent care with complaints of frequency of urination and burning urination which started a few days ago.  Patient has been treated for E. coli UTI in the past month.  Patient was initially prescribed Macrobid.  He could not complete the Macrobid because he lost a couple of days of the medications.  He was retreated with Bactrim which he completed.  Following that his symptoms improved.  Until a few days ago.  Patient also describes mid back pain with ejaculation.  There is no blood in his ejaculate and denies penile or urethral pain with ejaculate.  No history of kidney stones.  No penile rash or discharge.  No abdominal pain.  HPI  Past Medical History:  Diagnosis Date   HSV-1 infection     Patient Active Problem List   Diagnosis Date Noted   AKI (acute kidney injury) (HCC)    Syncope 11/06/2020   CONSTIPATION 06/30/2007    History reviewed. No pertinent surgical history.     Home Medications    Prior to Admission medications   Medication Sig Start Date End Date Taking? Authorizing Provider  phenazopyridine (PYRIDIUM) 200 MG tablet Take 1 tablet (200 mg total) by mouth 3 (three) times daily as needed for pain. 12/15/21  Yes Mitsugi Schrader, Britta Mccreedy, MD  phenazopyridine (PYRIDIUM) 200 MG tablet Take 1 tablet (200 mg total) by mouth 3 (three) times daily. 11/18/21   Valinda Hoar, NP    Family History Family History  Problem Relation Age of Onset   Healthy Mother    Healthy Father     Social History Social History   Tobacco Use   Smoking status: Never   Smokeless tobacco: Never   Tobacco comments:    black and mild 2x/week  Vaping Use   Vaping Use: Never used  Substance Use Topics   Alcohol use: Not Currently   Drug use: Never      Allergies   Patient has no known allergies.   Review of Systems Review of Systems  Respiratory: Negative.    Gastrointestinal: Negative.   Genitourinary:  Positive for dysuria and frequency. Negative for scrotal swelling and testicular pain.     Physical Exam Triage Vital Signs ED Triage Vitals  Enc Vitals Group     BP 12/15/21 1654 118/72     Pulse Rate 12/15/21 1654 60     Resp 12/15/21 1654 18     Temp 12/15/21 1654 98 F (36.7 C)     Temp src --      SpO2 12/15/21 1654 99 %     Weight --      Height --      Head Circumference --      Peak Flow --      Pain Score 12/15/21 1653 0     Pain Loc --      Pain Edu? --      Excl. in GC? --    No data found.  Updated Vital Signs BP 118/72   Pulse 60   Temp 98 F (36.7 C)   Resp 18   SpO2 99%   Visual Acuity Right Eye Distance:   Left Eye Distance:   Bilateral Distance:    Right  Eye Near:   Left Eye Near:    Bilateral Near:     Physical Exam   UC Treatments / Results  Labs (all labs ordered are listed, but only abnormal results are displayed) Labs Reviewed  POCT URINALYSIS DIPSTICK, ED / UC - Abnormal; Notable for the following components:      Result Value   Bilirubin Urine SMALL (*)    Protein, ur 30 (*)    All other components within normal limits  POCT URINALYSIS DIPSTICK, ED / UC    EKG   Radiology No results found.  Procedures Procedures (including critical care time)  Medications Ordered in UC Medications - No data to display  Initial Impression / Assessment and Plan / UC Course  I have reviewed the triage vital signs and the nursing notes.  Pertinent labs & imaging results that were available during my care of the patient were reviewed by me and considered in my medical decision making (see chart for details).     1.  Dysuria: Point-of-care urinalysis is negative for urinary tract infection Reassurance given Pyridium as needed for dysuria If symptoms persist, patient  may benefit from urology evaluation. Return precautions given. Final Clinical Impressions(s) / UC Diagnoses   Final diagnoses:  Dysuria     Discharge Instructions      Please increase oral fluid intake Acute urine exam is normal Please take medications as prescribed If you have worsening symptoms, you may benefit from urology evaluation.   ED Prescriptions     Medication Sig Dispense Auth. Provider   phenazopyridine (PYRIDIUM) 200 MG tablet Take 1 tablet (200 mg total) by mouth 3 (three) times daily as needed for pain. 10 tablet Angelic Schnelle, Myrene Galas, MD      PDMP not reviewed this encounter.   Chase Picket, MD 12/15/21 1753

## 2021-12-15 NOTE — Discharge Instructions (Addendum)
Please increase oral fluid intake Acute urine exam is normal Please take medications as prescribed If you have worsening symptoms, you may benefit from urology evaluation.

## 2021-12-15 NOTE — ED Triage Notes (Signed)
Pt reports he still has dysuria.

## 2022-01-02 ENCOUNTER — Encounter (HOSPITAL_COMMUNITY): Payer: Self-pay

## 2022-01-02 ENCOUNTER — Ambulatory Visit (HOSPITAL_COMMUNITY)
Admission: EM | Admit: 2022-01-02 | Discharge: 2022-01-02 | Disposition: A | Discharge status: Home / Self Care | Payer: Self-pay | Attending: Family Medicine | Admitting: Family Medicine

## 2022-01-02 DIAGNOSIS — R350 Frequency of micturition: Secondary | ICD-10-CM | POA: Insufficient documentation

## 2022-01-02 DIAGNOSIS — Z113 Encounter for screening for infections with a predominantly sexual mode of transmission: Secondary | ICD-10-CM | POA: Insufficient documentation

## 2022-01-02 DIAGNOSIS — R369 Urethral discharge, unspecified: Secondary | ICD-10-CM | POA: Insufficient documentation

## 2022-01-02 DIAGNOSIS — N410 Acute prostatitis: Secondary | ICD-10-CM | POA: Insufficient documentation

## 2022-01-02 LAB — POCT URINALYSIS DIPSTICK, ED / UC
Bilirubin Urine: NEGATIVE
Glucose, UA: NEGATIVE mg/dL
Hgb urine dipstick: NEGATIVE
Ketones, ur: NEGATIVE mg/dL
Leukocytes,Ua: NEGATIVE
Nitrite: NEGATIVE
Protein, ur: NEGATIVE mg/dL
Specific Gravity, Urine: 1.025 (ref 1.005–1.030)
Urobilinogen, UA: 0.2 mg/dL (ref 0.0–1.0)
pH: 7 (ref 5.0–8.0)

## 2022-01-02 MED ORDER — CIPROFLOXACIN HCL 500 MG PO TABS: 500.0000 mg | ORAL_TABLET | Freq: Two times a day (BID) | ORAL | 0 refills | Status: DC

## 2022-01-02 MED ORDER — CIPROFLOXACIN HCL 500 MG PO TABS: 500.0000 mg | ORAL_TABLET | Freq: Two times a day (BID) | ORAL | 0 refills | Status: AC

## 2022-01-02 NOTE — ED Triage Notes (Addendum)
Patient states this week with frequent urination, tingle with urination. Patient states he had a bladder infection that has been off and on.   Patient states pain with ejaculation. States not much comes out anymore. No discoloration to it.

## 2022-01-02 NOTE — Discharge Instructions (Addendum)
Take ciprofloxacin twice daily for the next 7 days with food to treat acute bacterial prostatitis.  Your STD testing has been sent to the lab and will come back in the next 2 to 3 days.  We will call you if any of your results are positive requiring treatment and treat you at that time.   Avoid sexual intercourse until your STD results come back.  If any of your STD results are positive, you will need to avoid sexual intercourse for 7 days while you are being treated to prevent spread of STD.  Condom use is the best way to prevent spread of STDs.  Return to urgent care as needed.

## 2022-01-02 NOTE — ED Provider Notes (Signed)
[facility]    CSN: [id number] Arrival date & time: 01/02/22  1720      History   Chief Complaint Chief Complaint  Patient presents with   Dysuria   Penis Pain    HPI Danny Mccoy is a 36 y.o. male.   Patient presents urgent care for evaluation of urinary frequency, dysuria, decreased ejaculatory fluid, and painful ejaculation for the last week.  Patient was sexually active with a new male partner last week and also reports clear penile discharge after this encounter.  He was evaluated in October and treated for a urinary tract infection due to E. coli.  He took most of the antibiotic but stopped taking it midway through the course.  He was evaluated a few weeks later for urinary frequency and his urine had cleared up without evidence of UTI.  He was sent home with Pyridium which helped his dysuria but urinary frequency has persisted over the last month.  Last week, he began to have rectal discomfort and pain with ejaculation.  He also noticed that his ejaculation fluid had decreased.  He reports he is "very sexually active" and has a history of prostatitis.  He has been seen and evaluated by urology in the past who told him that if he continued to participate in sexual activity this way, he would end up with frequent prostatitis infections and enlarged prostate.  He denies hematuria, urinary hesitancy, decreased urine output, rectal bleeding, blood/mucus of the stools, constipation, diarrhea, fever/chills, back pain, shortness of breath, chest pain, blood in the semen, and abdominal pain.  Denies penile rash and known exposure to STD.  He was screened for HIV and syphilis last month both of which were negative.  He has an appointment in January 2024 with a urologist.  Has not attempted any use of medications prior to arrival urgent care for symptoms.   Dysuria Presenting symptoms: dysuria and penile pain   Penis Pain    Past Medical History:  Diagnosis Date    HSV-1 infection     Patient Active Problem List   Diagnosis Date Noted   AKI (acute kidney injury) (HCC)    Syncope 11/06/2020   CONSTIPATION 06/30/2007    History reviewed. No pertinent surgical history.     Home Medications    Prior to Admission medications   Medication Sig Start Date End Date Taking? Authorizing Provider  ciprofloxacin (CIPRO) 500 MG tablet Take 1 tablet (500 mg total) by mouth every 12 (twelve) hours for 7 days. 01/02/22 01/09/22  Carlisle Beers, FNP  phenazopyridine (PYRIDIUM) 200 MG tablet Take 1 tablet (200 mg total) by mouth 3 (three) times daily. 11/18/21   White, Elita Boone, NP  phenazopyridine (PYRIDIUM) 200 MG tablet Take 1 tablet (200 mg total) by mouth 3 (three) times daily as needed for pain. 12/15/21   Lamptey, Britta Mccreedy, MD    Family History Family History  Problem Relation Age of Onset   Healthy Mother    Healthy Father     Social History Social History   Tobacco Use   Smoking status: Never   Smokeless tobacco: Never   Tobacco comments:    black and mild 2x/week  Vaping Use   Vaping Use: Never used  Substance Use Topics   Alcohol use: Not Currently   Drug use: Never     Allergies   Patient has no known allergies.   Review of Systems Review of Systems  Genitourinary:  Positive for dysuria and penile  pain.  Per HPI   Physical Exam Triage Vital Signs ED Triage Vitals  Enc Vitals Group     BP 01/02/22 1806 (!) 132/57     Pulse Rate 01/02/22 1806 61     Resp 01/02/22 1806 16     Temp 01/02/22 1806 98.8 F (37.1 C)     Temp Source 01/02/22 1806 Oral     SpO2 01/02/22 1806 99 %     Weight --      Height --      Head Circumference --      Peak Flow --      Pain Score 01/02/22 1805 4     Pain Loc --      Pain Edu? --      Excl. in GC? --    No data found.  Updated Vital Signs BP (!) 132/57 (BP Location: Right Arm)   Pulse 61   Temp 98.8 F (37.1 C) (Oral)   Resp 16   SpO2 99%   Visual Acuity Right  Eye Distance:   Left Eye Distance:   Bilateral Distance:    Right Eye Near:   Left Eye Near:    Bilateral Near:     Physical Exam Vitals and nursing note reviewed.  Constitutional:      Appearance: Normal appearance. He is not ill-appearing or toxic-appearing.  HENT:     Head: Normocephalic and atraumatic.     Right Ear: Hearing and external ear normal.     Left Ear: Hearing and external ear normal.     Nose: Nose normal.     Mouth/Throat:     Lips: Pink.  Eyes:     General: Lids are normal. Vision grossly intact. Gaze aligned appropriately.     Extraocular Movements: Extraocular movements intact.     Conjunctiva/sclera: Conjunctivae normal.  Pulmonary:     Effort: Pulmonary effort is normal.  Genitourinary:    Comments: Deferred. Musculoskeletal:     Cervical back: Neck supple.  Skin:    General: Skin is warm and dry.     Capillary Refill: Capillary refill takes less than 2 seconds.     Findings: No rash.  Neurological:     General: No focal deficit present.     Mental Status: He is alert and oriented to person, place, and time. Mental status is at baseline.     Cranial Nerves: No dysarthria or facial asymmetry.  Psychiatric:        Mood and Affect: Mood normal.        Speech: Speech normal.        Behavior: Behavior normal.        Thought Content: Thought content normal.        Judgment: Judgment normal.      UC Treatments / Results  Labs (all labs ordered are listed, but only abnormal results are displayed) Labs Reviewed  POCT URINALYSIS DIPSTICK, ED / UC    EKG   Radiology No results found.  Procedures Procedures (including critical care time)  Medications Ordered in UC Medications - No data to display  Initial Impression / Assessment and Plan / UC Course  I have reviewed the triage vital signs and the nursing notes.  Pertinent labs & imaging results that were available during my care of the patient were reviewed by me and considered in my  medical decision making (see chart for details).   1.  Acute prostatitis Presentation is consistent with acute prostatitis that will likely respond  well to ciprofloxacin twice daily for the next 7 days.  Urinalysis is unremarkable for urinary tract infection and is clean.  Advised patient to decrease the amount of sexual activity he is participating in as this is further aggravating the prostate.  Follow-up with urology in January.  STD screening is pending and will come back in the next 2 to 3 days.  We will call patient with results and treat based on results of swab.  Declined HIV and syphilis testing today.  Advised to avoid sexual intercourse until STD testing comes back to prevent spread of STD to other people.  Condom use recommended.  PCP follow-up in the meantime if symptoms fail to improve in the next few days with antibiotic use.  Discussed physical exam and available lab work findings in clinic with patient.  Counseled patient regarding appropriate use of medications and potential side effects for all medications recommended or prescribed today. Discussed red flag signs and symptoms of worsening condition,when to call the PCP office, return to urgent care, and when to seek higher level of care in the emergency department. Patient verbalizes understanding and agreement with plan. All questions answered. Patient discharged in stable condition.    Final Clinical Impressions(s) / UC Diagnoses   Final diagnoses:  Acute prostatitis  Urinary frequency  Screen for STD (sexually transmitted disease)  Penile discharge     Discharge Instructions      Take ciprofloxacin twice daily for the next 7 days with food to treat acute bacterial prostatitis.  Your STD testing has been sent to the lab and will come back in the next 2 to 3 days.  We will call you if any of your results are positive requiring treatment and treat you at that time.   Avoid sexual intercourse until your STD results come  back.  If any of your STD results are positive, you will need to avoid sexual intercourse for 7 days while you are being treated to prevent spread of STD.  Condom use is the best way to prevent spread of STDs.  Return to urgent care as needed.     ED Prescriptions     Medication Sig Dispense Auth. Provider   ciprofloxacin (CIPRO) 500 MG tablet  (Status: Discontinued) Take 1 tablet (500 mg total) by mouth every 12 (twelve) hours for 5 days. 10 tablet Reita May M, FNP   ciprofloxacin (CIPRO) 500 MG tablet Take 1 tablet (500 mg total) by mouth every 12 (twelve) hours for 7 days. 14 tablet Carlisle Beers, FNP      PDMP not reviewed this encounter.   Carlisle Beers, [place] 01/02/22 1857

## 2022-01-05 LAB — CYTOLOGY, (ORAL, ANAL, URETHRAL) ANCILLARY ONLY
Chlamydia: NEGATIVE
Comment: NEGATIVE
Comment: NEGATIVE
Comment: NORMAL
Neisseria Gonorrhea: NEGATIVE
Trichomonas: NEGATIVE

## 2022-01-17 ENCOUNTER — Ambulatory Visit (HOSPITAL_COMMUNITY)
Admission: EM | Admit: 2022-01-17 | Discharge: 2022-01-17 | Disposition: A | Discharge status: Home / Self Care | Payer: Self-pay | Attending: Physician Assistant | Admitting: Physician Assistant

## 2022-01-17 ENCOUNTER — Encounter (HOSPITAL_COMMUNITY): Payer: Self-pay | Admitting: Emergency Medicine

## 2022-01-17 DIAGNOSIS — J029 Acute pharyngitis, unspecified: Secondary | ICD-10-CM

## 2022-01-17 DIAGNOSIS — J209 Acute bronchitis, unspecified: Secondary | ICD-10-CM

## 2022-01-17 DIAGNOSIS — J069 Acute upper respiratory infection, unspecified: Secondary | ICD-10-CM

## 2022-01-17 MED ORDER — ALBUTEROL SULFATE HFA 108 (90 BASE) MCG/ACT IN AERS: 1.0000 | INHALATION_SPRAY | Freq: Four times a day (QID) | RESPIRATORY_TRACT | 0 refills | Status: DC | PRN

## 2022-01-17 MED ORDER — AMOXICILLIN 500 MG PO CAPS: 500.0000 mg | ORAL_CAPSULE | Freq: Three times a day (TID) | ORAL | 0 refills | Status: DC

## 2022-01-17 NOTE — ED Triage Notes (Signed)
Pt reports sore throat, cough, congestion, headache, feeling bad for 2-3 days. Taken mucinex

## 2022-01-17 NOTE — ED Provider Notes (Signed)
[facility]    CSN: [id number] Arrival date & time: 01/17/22  1316      History   Chief Complaint Chief Complaint  Patient presents with   Nasal Congestion   Cough    HPI Danny Mccoy is a 36 y.o. male.   36 year old male presents with sore throat and congestion.  Patient indicates for the past 3 days she has been having progressive sore throat, painful swallowing.  He also indicates he is having upper respiratory congestion with rhinitis and postnasal drip with purulent production.  Patient indicates he is also having intermittent cough chest congestion with thick purulent production and intermittent wheezing and shortness of breath with the coughing episodes.  Patient indicates he has not had fever or chills but he does have mild fatigue symptoms.  Patient does indicate running fever yesterday this morning and he took ibuprofen which helped reduce the fever before he came to the office today.  Patient indicates has been taking Mucinex DM with minimal relief of her symptoms.  Patient states he has not been around any family or friends with similar symptoms.   Cough Associated symptoms: shortness of breath, sore throat and wheezing     Past Medical History:  Diagnosis Date   HSV-1 infection     Patient Active Problem List   Diagnosis Date Noted   AKI (acute kidney injury) (HCC)    Syncope 11/06/2020   CONSTIPATION 06/30/2007    History reviewed. No pertinent surgical history.     Home Medications    Prior to Admission medications   Medication Sig Start Date End Date Taking? Authorizing Provider  albuterol (VENTOLIN HFA) 108 (90 Base) MCG/ACT inhaler Inhale 1-2 puffs into the lungs every 6 (six) hours as needed for wheezing or shortness of breath. 01/17/22  Yes Ellsworth Lennox, PA-C  amoxicillin (AMOXIL) 500 MG capsule Take 1 capsule (500 mg total) by mouth 3 (three) times daily. 01/17/22  Yes Ellsworth Lennox, PA-C  phenazopyridine (PYRIDIUM) 200 MG tablet  Take 1 tablet (200 mg total) by mouth 3 (three) times daily. 11/18/21   White, Elita Boone, NP  phenazopyridine (PYRIDIUM) 200 MG tablet Take 1 tablet (200 mg total) by mouth 3 (three) times daily as needed for pain. 12/15/21   Lamptey, Britta Mccreedy, MD    Family History Family History  Problem Relation Age of Onset   Healthy Mother    Healthy Father     Social History Social History   Tobacco Use   Smoking status: Never   Smokeless tobacco: Never   Tobacco comments:    black and mild 2x/week  Vaping Use   Vaping Use: Never used  Substance Use Topics   Alcohol use: Not Currently   Drug use: Never     Allergies   Patient has no known allergies.   Review of Systems Review of Systems  HENT:  Positive for sore throat.   Respiratory:  Positive for cough, shortness of breath and wheezing.      Physical Exam Triage Vital Signs ED Triage Vitals  Enc Vitals Group     BP 01/17/22 1530 105/72     Pulse Rate 01/17/22 1530 63     Resp 01/17/22 1530 17     Temp 01/17/22 1530 98.3 F (36.8 C)     Temp Source 01/17/22 1530 Oral     SpO2 01/17/22 1530 97 %     Weight --      Height --      Head  Circumference --      Peak Flow --      Pain Score 01/17/22 1529 8     Pain Loc --      Pain Edu? --      Excl. in GC? --    No data found.  Updated Vital Signs BP 105/72 (BP Location: Left Arm)   Pulse 63   Temp 98.3 F (36.8 C) (Oral)   Resp 17   SpO2 97%   Visual Acuity Right Eye Distance:   Left Eye Distance:   Bilateral Distance:    Right Eye Near:   Left Eye Near:    Bilateral Near:     Physical Exam Constitutional:      Appearance: Normal appearance.  HENT:     Right Ear: Tympanic membrane and ear canal normal.     Left Ear: Tympanic membrane and ear canal normal.     Mouth/Throat:     Mouth: Mucous membranes are moist.     Pharynx: Posterior oropharyngeal erythema present. No oropharyngeal exudate.  Cardiovascular:     Rate and Rhythm: Normal rate and  regular rhythm.     Heart sounds: Normal heart sounds.  Pulmonary:     Effort: Pulmonary effort is normal.     Breath sounds: Normal breath sounds and air entry. No wheezing, rhonchi or rales.  Lymphadenopathy:     Cervical: No cervical adenopathy.  Neurological:     Mental Status: He is alert.      UC Treatments / Results  Labs (all labs ordered are listed, but only abnormal results are displayed) Labs Reviewed - No data to display  EKG   Radiology No results found.  Procedures Procedures (including critical care time)  Medications Ordered in UC Medications - No data to display  Initial Impression / Assessment and Plan / UC Course  I have reviewed the triage vital signs and the nursing notes.  Pertinent labs & imaging results that were available during my care of the patient were reviewed by me and considered in my medical decision making (see chart for details).    Plan: 1.  The upper respiratory infection be treated with the following: A.  Advised to continue with Mucinex DM every 12 hours to help treat cough and congestion. 2.  Acute bronchitis will be treated with the following: A. Albuterol inhaler, 2 puffs every 6 hours to help control wheezing and shortness of breath. B.  Amoxil 500 mg every 8 hours to help treat the infection. 3.  Patient advised follow-up PCP or return to urgent care if symptoms fail to improve. Final Clinical Impressions(s) / UC Diagnoses   Final diagnoses:  Viral upper respiratory tract infection  Acute pharyngitis, unspecified etiology  Acute bronchitis, unspecified organism     Discharge Instructions      Is to continue to use Mucinex DM every 12 hours to help control cough and congestion. Advised to take Tylenol or ibuprofen as needed for fever and discomfort. Advised to use albuterol inhaler, 2 puffs every 6 hours on a regular basis to decrease wheezing and shortness of breath. Advised take amoxicillin 1000 mg every 8 hours  until completed to treat the respiratory infection. Advised follow-up PCP or return to urgent care if signs fail to improve.    ED Prescriptions     Medication Sig Dispense Auth. Provider   albuterol (VENTOLIN HFA) 108 (90 Base) MCG/ACT inhaler Inhale 1-2 puffs into the lungs every 6 (six) hours as needed for wheezing or  shortness of breath. 8 g Ellsworth Lennox, PA-C   amoxicillin (AMOXIL) 500 MG capsule Take 1 capsule (500 mg total) by mouth 3 (three) times daily. 21 capsule Ellsworth Lennox, PA-C      PDMP not reviewed this encounter.   Ellsworth Lennox, PA-C 01/17/22 1547

## 2022-01-17 NOTE — Discharge Instructions (Signed)
Is to continue to use Mucinex DM every 12 hours to help control cough and congestion. Advised to take Tylenol or ibuprofen as needed for fever and discomfort. Advised to use albuterol inhaler, 2 puffs every 6 hours on a regular basis to decrease wheezing and shortness of breath. Advised take amoxicillin 1000 mg every 8 hours until completed to treat the respiratory infection. Advised follow-up PCP or return to urgent care if signs fail to improve.

## 2022-01-20 ENCOUNTER — Ambulatory Visit (HOSPITAL_COMMUNITY)
Admission: EM | Admit: 2022-01-20 | Discharge: 2022-01-20 | Disposition: A | Discharge status: Home / Self Care | Payer: Self-pay | Attending: Family Medicine | Admitting: Family Medicine

## 2022-01-20 ENCOUNTER — Ambulatory Visit (INDEPENDENT_AMBULATORY_CARE_PROVIDER_SITE_OTHER): Payer: Self-pay

## 2022-01-20 DIAGNOSIS — R0602 Shortness of breath: Secondary | ICD-10-CM

## 2022-01-20 DIAGNOSIS — R059 Cough, unspecified: Secondary | ICD-10-CM

## 2022-01-20 DIAGNOSIS — J069 Acute upper respiratory infection, unspecified: Secondary | ICD-10-CM

## 2022-01-20 MED ORDER — ALBUTEROL SULFATE HFA 108 (90 BASE) MCG/ACT IN AERS
INHALATION_SPRAY | RESPIRATORY_TRACT | Status: AC
  Filled 2022-01-20: qty 6.7

## 2022-01-20 MED ORDER — ALBUTEROL SULFATE HFA 108 (90 BASE) MCG/ACT IN AERS
2.0000 | INHALATION_SPRAY | Freq: Once | RESPIRATORY_TRACT | Status: AC
  Administered 2022-01-20: 2 via RESPIRATORY_TRACT

## 2022-01-20 NOTE — ED Triage Notes (Signed)
Pt is here for cough, nasal congestion, runny nose, SOB, chills, loss of appetite , low energy ,and chest congestion  x 3days

## 2022-01-20 NOTE — Discharge Instructions (Addendum)
It's going to take a few more days for you to get better. Use the inhaler provided as needed. Finish the antibiotic previously prescribed.

## 2022-01-20 NOTE — ED Provider Notes (Signed)
[facility]   [id number] 01/20/22 Arrival Time: 8329  ASSESSMENT & PLAN:  1. Viral URI with cough   2. SOB (shortness of breath)    I have personally viewed the imaging studies ordered this visit. No acute changes. No signs of PNA. No pneumothorax.  Discussed typical duration of likely viral illness. Will have him finish the amoxicillin Rx at previous visit. Has not filled inhaler secondary to price of unit. Albuterol inhaler provided here. OTC symptom care as needed. No resp distress. Work note provided.  Meds ordered this encounter  Medications   albuterol (VENTOLIN HFA) 108 (90 Base) MCG/ACT inhaler 2 puff     Follow-up Information     [facility].   Specialty: Urgent Care Why: If worsening or failing to improve as anticipated. Contact information: [address] [place] [address] [phone]                 Discharge Instructions      It's going to take a few more days for you to get better. Use the inhaler provided as needed. Finish the antibiotic previously prescribed.     Reviewed expectations re: course of current medical issues. Questions answered. Outlined signs and symptoms indicating need for more acute intervention. Understanding verbalized. After Visit Summary given.   SUBJECTIVE: History from: Patient. Danny Mccoy is a 36 y.o. male. Reports: Pt is here for cough, nasal congestion, runny nose, SOB, chills, loss of appetite, low energy, and chest congestion. Seen here 3 days ago. Denies: fever. Normal PO intake without n/v/d.  OBJECTIVE:  Vitals:   01/20/22 1027  BP: 117/86  Pulse: 60  Resp: 16  Temp: 98.6 F (37 C)  TempSrc: Oral  SpO2: 97%    General appearance: alert; no distress Eyes: PERRLA; EOMI; conjunctiva normal HENT: [place]; AT; with nasal congestion Neck: supple  Lungs: speaks full sentences without difficulty; unlabored; bilateral coarse breath  sounds with mild bilat exp wheezing Extremities: no edema Skin: warm and dry Neurologic: normal gait Psychological: alert and cooperative; normal mood and affect  Imaging: DG Chest 2 View  Result Date: 01/20/2022 CLINICAL DATA:  Cough, shortness of breath EXAM: CHEST - 2 VIEW COMPARISON:  04/06/2021 FINDINGS: The heart size and mediastinal contours are within normal limits. Both lungs are clear. The visualized skeletal structures are unremarkable. IMPRESSION: No active cardiopulmonary disease. Electronically Signed   By: Ernie Avena M.D.   On: 01/20/2022 11:20     No Known Allergies  Past Medical History:  Diagnosis Date   HSV-1 infection    Social History   Socioeconomic History   Marital status: Single    Spouse name: Not on file   Number of children: Not on file   Years of education: Not on file   Highest education level: Not on file  Occupational History   Not on file  Tobacco Use   Smoking status: Never   Smokeless tobacco: Never   Tobacco comments:    black and mild 2x/week  Vaping Use   Vaping Use: Never used  Substance and Sexual Activity   Alcohol use: Not Currently   Drug use: Never   Sexual activity: Not on file  Other Topics Concern   Not on file  Social History Narrative   Not on file   Social Determinants of Health   Financial Resource Strain: Not on file  Food Insecurity: Not on file  Transportation Needs: Not on file  Physical Activity:  Not on file  Stress: Not on file  Social Connections: Not on file  Intimate Partner Violence: Not on file   Family History  Problem Relation Age of Onset   Healthy Mother    Healthy Father    No past surgical history on file.   Mardella Layman, MD 01/20/22 1141

## 2022-02-14 ENCOUNTER — Other Ambulatory Visit: Payer: Self-pay

## 2022-02-14 ENCOUNTER — Emergency Department (HOSPITAL_COMMUNITY)
Admission: EM | Admit: 2022-02-14 | Discharge: 2022-02-14 | Disposition: A | Discharge status: Home / Self Care | Payer: Self-pay | Attending: Student | Admitting: Student

## 2022-02-14 DIAGNOSIS — J029 Acute pharyngitis, unspecified: Secondary | ICD-10-CM | POA: Insufficient documentation

## 2022-02-14 DIAGNOSIS — Z202 Contact with and (suspected) exposure to infections with a predominantly sexual mode of transmission: Secondary | ICD-10-CM | POA: Insufficient documentation

## 2022-02-14 LAB — URINALYSIS, ROUTINE W REFLEX MICROSCOPIC
Bilirubin Urine: NEGATIVE
Glucose, UA: NEGATIVE mg/dL
Ketones, ur: NEGATIVE mg/dL
Nitrite: NEGATIVE
Protein, ur: 30 mg/dL — AB
Specific Gravity, Urine: 1.021 (ref 1.005–1.030)
WBC, UA: >50 WBC/hpf — ABNORMAL HIGH (ref 0–5)
pH: 7 (ref 5.0–8.0)

## 2022-02-14 MED ORDER — CEFTRIAXONE SODIUM 500 MG IJ SOLR
500.0000 mg | Freq: Once | INTRAMUSCULAR | Status: AC
  Administered 2022-02-14: 500 mg via INTRAMUSCULAR
  Filled 2022-02-14: qty 500

## 2022-02-14 MED ORDER — ACETAMINOPHEN 325 MG PO TABS
650.0000 mg | ORAL_TABLET | Freq: Once | ORAL | Status: AC
  Administered 2022-02-14: 650 mg via ORAL
  Filled 2022-02-14: qty 2

## 2022-02-14 MED ORDER — DOXYCYCLINE HYCLATE 100 MG PO CAPS: 100.0000 mg | ORAL_CAPSULE | Freq: Two times a day (BID) | ORAL | 0 refills | Status: AC

## 2022-02-14 MED ORDER — LIDOCAINE HCL (PF) 1 % IJ SOLN
1.0000 mL | Freq: Once | INTRAMUSCULAR | Status: AC
  Administered 2022-02-14: 1.2 mL
  Filled 2022-02-14: qty 5

## 2022-02-14 NOTE — ED Notes (Signed)
Patient refused throat swab.

## 2022-02-14 NOTE — ED Triage Notes (Signed)
Patient requesting STD test  reports dysuria with penile discharge and sore throat onset this week .

## 2022-02-14 NOTE — ED Provider Notes (Signed)
[facility] EMERGENCY DEPARTMENT Provider Note   CSN: [id number] Arrival date & time: 02/14/22  8416     History  Chief Complaint  Patient presents with   Penile Discharge / Sore Throat    Danny Mccoy is a 37 y.o. male.  HPI   Patient without significant medical history presents with complaints of STD exposure, states that he was at a party 3 days ago and had multiple partners, he is now endorsing burning sensation with urination as well as penile discharge, no testicular pain, no pain around the peritoneum, he is endorsing any genital lesions, he also states having a sore throat, sore throat started after the sexual intercourse, he does admit to oral intercourse, he has no history of strep throat, states it hurts as well but is still able tolerate p.o., no fevers or chills   Home Medications Prior to Admission medications   Medication Sig Start Date End Date Taking? Authorizing Provider  doxycycline (VIBRAMYCIN) 100 MG capsule Take 1 capsule (100 mg total) by mouth 2 (two) times daily for 7 days. 02/14/22 02/21/22 Yes Carroll Sage, PA-C  albuterol (VENTOLIN HFA) 108 (90 Base) MCG/ACT inhaler Inhale 1-2 puffs into the lungs every 6 (six) hours as needed for wheezing or shortness of breath. 01/17/22   Ellsworth Lennox, PA-C  amoxicillin (AMOXIL) 500 MG capsule Take 1 capsule (500 mg total) by mouth 3 (three) times daily. 01/17/22   Ellsworth Lennox, PA-C  phenazopyridine (PYRIDIUM) 200 MG tablet Take 1 tablet (200 mg total) by mouth 3 (three) times daily. 11/18/21   White, Elita Boone, NP  phenazopyridine (PYRIDIUM) 200 MG tablet Take 1 tablet (200 mg total) by mouth 3 (three) times daily as needed for pain. 12/15/21   Lamptey, Britta Mccreedy, MD      Allergies    Patient has no known allergies.    Review of Systems   Review of Systems  Constitutional:  Negative for chills and fever.  HENT:  Positive for sore throat.   Respiratory:  Negative for shortness of breath.    Cardiovascular:  Negative for chest pain.  Gastrointestinal:  Negative for abdominal pain.  Genitourinary:  Positive for penile discharge.  Neurological:  Negative for headaches.    Physical Exam Updated Vital Signs BP 116/73   Pulse 88   Temp 98.6 F (37 C) (Oral)   Resp 20   SpO2 98%  Physical Exam Vitals and nursing note reviewed. Exam conducted with a chaperone present.  Constitutional:      General: He is not in acute distress.    Appearance: He is not ill-appearing.  HENT:     Head: Normocephalic and atraumatic.     Nose: No congestion.     Mouth/Throat:     Mouth: Mucous membranes are moist.     Pharynx: Oropharyngeal exudate and posterior oropharyngeal erythema present.     Comments: No trismus no torticollis no oral oral edema present, tongue uvula both midline he is controlling oral secretions tonsils are both equal/bilaterally, there is noted erythema and exudates bilaterally, no submandibular swelling no muffled tone voice Eyes:     Conjunctiva/sclera: Conjunctivae normal.  Cardiovascular:     Rate and Rhythm: Normal rate and regular rhythm.     Pulses: Normal pulses.     Heart sounds: No murmur heard.    No friction rub. No gallop.  Pulmonary:     Effort: No respiratory distress.     Breath sounds: No wheezing, rhonchi or rales.  Abdominal:     Palpations: Abdomen is soft.     Tenderness: There is no abdominal tenderness. There is no right CVA tenderness or left CVA tenderness.  Skin:    General: Skin is warm and dry.  Neurological:     Mental Status: He is alert.  Psychiatric:        Mood and Affect: Mood normal.     ED Results / Procedures / Treatments   Labs (all labs ordered are listed, but only abnormal results are displayed) Labs Reviewed  URINALYSIS, ROUTINE W REFLEX MICROSCOPIC - Abnormal; Notable for the following components:      Result Value   APPearance CLOUDY (*)    Hgb urine dipstick SMALL (*)    Protein, ur 30 (*)    Leukocytes,Ua  LARGE (*)    WBC, UA >50 (*)    Bacteria, UA FEW (*)    All other components within normal limits  GROUP A STREP BY PCR  GC/CHLAMYDIA PROBE AMP ([place]) NOT AT [facility]    EKG None  Radiology No results found.  Procedures Procedures    Medications Ordered in ED Medications  cefTRIAXone (ROCEPHIN) injection 500 mg (500 mg Intramuscular Given 02/14/22 0336)  lidocaine (PF) (XYLOCAINE) 1 % injection 1-2.1 mL (1.2 mLs Other Given 02/14/22 0336)  acetaminophen (TYLENOL) tablet 650 mg (650 mg Oral Given 02/14/22 0353)    ED Course/ Medical Decision Making/ A&P                           Medical Decision Making Amount and/or Complexity of Data Reviewed Labs: ordered.  Risk OTC drugs. Prescription drug management.   This patient presents to the ED for concern of penile discharge, this involves an extensive number of treatment options, and is a complaint that carries with it a high risk of complications and morbidity.  The differential diagnosis includes STI, UTI, epididymitis    Additional history obtained:  Additional history obtained from N/A External records from outside source obtained and reviewed including recent ER notes   Co morbidities that complicate the patient evaluation  N/A  Social Determinants of Health:  No primary care provider    Lab Tests:  I Ordered, and personally interpreted labs.  The pertinent results include: GC chlamydia pending, UA shows large leukocytes red blood cells white blood cells few bacteria   Imaging Studies ordered:  I ordered imaging studies including N/A I independently visualized and interpreted imaging which showed N/A I agree with the radiologist interpretation   Cardiac Monitoring:  The patient was maintained on a cardiac monitor.  I personally viewed and interpreted the cardiac monitored which showed an underlying rhythm of: N/A   Medicines ordered and prescription drug management:  I ordered medication  including ceftriaxone, Tylenol I have reviewed the patients home medicines and have made adjustments as needed  Critical Interventions:  N/A   Reevaluation:  Presents with sore throat and penile discharge, he had an erythematous throat as well as exudates present, concerning for bacterial tonsillitis, will obtain strep test, and obtain GC chlamydia for further evaluation, I did offer HI the as well as syphilis testing but he deferred  Patient is unable to tolerate throat swab, spoke with pharmacy on staff and states that between the ceftriaxone and doxycycline this should cover patient for potential strep pharyngitis.  Patient is reassessed after Tylenol, vital signs have improved he is agreement discharge at this time  Consultations Obtained:  N/A  Test Considered:  N/A    Rule out Suspicion for pyelo, kidney stone, UTI is low as he not endorsing any suprapubic pain or flank tenderness CVA tenderness he has no associated nausea vomiting no stomach pain.  His UA is slightly abnormal with white blood cells leukocytes present, but this is likely more consistent with recent STD exposure versus UTI.  Low suspicion for epididymitis or prostatitis as patient denies saddle pain, denies any  testicle pain.  Suspicion for Ludwig angina, retropharyngeal/peritonsillar abscess is low as he has no muffled tone voice, there is no unilateral swelling of the oral pharynx, tongue uvula both midline he is controlling oral secretions.    Dispostion and problem list  After consideration of the diagnostic results and the patients response to treatment, I feel that the patent would benefit from discharge.  STI exposure-patient was treated prophylactically for STI, is given a shot of ceftriaxone here in the emergency department, will discharge home on doxycycline given strict return precautions Pharyngitis-unclear etiology, concern for bacterial infection, it is possible this could be gonorrhea,  which he did get treatment for gonorrhea, there is a possibility he could have strep pharyngitis but again he would not tolerate a swab, between the ceftriaxone and doxycycline that should cover it, but given strict return precautions.            Final Clinical Impression(s) / ED Diagnoses Final diagnoses:  Pharyngitis, unspecified etiology  STD exposure    Rx / DC Orders ED Discharge Orders          Ordered    doxycycline (VIBRAMYCIN) 100 MG capsule  2 times daily        02/14/22 0548              Carroll Sage, PA-C 02/14/22 0551    Sabas Sous, MD 02/14/22 [phone]

## 2022-02-14 NOTE — Discharge Instructions (Signed)
STD exposure-you are given a shot of ceftriaxone this cover you for gonorrhea, I have discharged you home on doxycycline, this medication will cover you for chlamydia please take as prescribed I recommend abstaining from sexual intercourse for the next 7 days or until you get your results back Sore throat-possibly could be strep pharyngitis and/or from gonorrhea, I have given you ceftriaxone here in the emergency department and discharged home on doxycycline, if your symptoms do not improve after course of antibiotics please come back for reassessment  Come back to the emergency department if you develop chest pain, shortness of breath, severe abdominal pain, uncontrolled nausea, vomiting, diarrhea.

## 2022-02-14 NOTE — ED Provider Triage Note (Signed)
Emergency Medicine Provider Triage Evaluation Note  Danny Mccoy , a 37 y.o. male  was evaluated in triage.  Pt complains of complaints of STD exposure, states that he was at a party 3 days ago and had multiple partners, he is now endorsing burning sensation with urination as well as penile discharge, no testicular pain, no pain redness peritoneum, he also states having a sore throat, sore throat started after the sexual intercourse, he does admit to oral intercourse, he has no history of strep throat, states it hurts as well but is still able tolerate p.o., no fevers or chills.  Review of Systems  Positive: Sore throat, penile discharge Negative: Chest pain, shortness of breath  Physical Exam  There were no vitals taken for this visit. Gen:   Awake, no distress   Resp:  Normal effort  MSK:   Moves extremities without difficulty  Other:    Medical Decision Making  Medically screening exam initiated at 3:34 AM.  Appropriate orders placed.  Ab Ra Pfiester was informed that the remainder of the evaluation will be completed by another provider, this initial triage assessment does not replace that evaluation, and the importance of remaining in the ED until their evaluation is complete.  Lab work has been ordered will need further evaluation.   Marcello Fennel, PA-C 02/14/22 [phone]

## 2022-02-16 LAB — GC/CHLAMYDIA PROBE AMP (~~LOC~~) NOT AT ARMC
Chlamydia: NEGATIVE
Comment: NEGATIVE
Comment: NORMAL
Neisseria Gonorrhea: NEGATIVE

## 2022-04-30 ENCOUNTER — Emergency Department (HOSPITAL_COMMUNITY)
Admission: EM | Admit: 2022-04-30 | Discharge: 2022-04-30 | Disposition: A | Discharge status: Home / Self Care | Payer: Self-pay | Attending: Student | Admitting: Student

## 2022-04-30 ENCOUNTER — Encounter (HOSPITAL_COMMUNITY): Payer: Self-pay | Admitting: Emergency Medicine

## 2022-04-30 ENCOUNTER — Encounter (HOSPITAL_COMMUNITY): Payer: Self-pay

## 2022-04-30 ENCOUNTER — Ambulatory Visit (HOSPITAL_COMMUNITY)
Admission: EM | Admit: 2022-04-30 | Discharge: 2022-04-30 | Disposition: A | Discharge status: Home / Self Care | Payer: Self-pay

## 2022-04-30 DIAGNOSIS — B349 Viral infection, unspecified: Secondary | ICD-10-CM

## 2022-04-30 DIAGNOSIS — J069 Acute upper respiratory infection, unspecified: Secondary | ICD-10-CM | POA: Insufficient documentation

## 2022-04-30 DIAGNOSIS — Z1152 Encounter for screening for COVID-19: Secondary | ICD-10-CM | POA: Insufficient documentation

## 2022-04-30 LAB — GROUP A STREP BY PCR: Group A Strep by PCR: NOT DETECTED

## 2022-04-30 LAB — RESP PANEL BY RT-PCR (RSV, FLU A&B, COVID)  RVPGX2
Influenza A by PCR: NEGATIVE
Influenza B by PCR: NEGATIVE
Resp Syncytial Virus by PCR: NEGATIVE
SARS Coronavirus 2 by RT PCR: NEGATIVE

## 2022-04-30 NOTE — ED Triage Notes (Signed)
Reports this weekend started feeling bad. Started off with sore throat, fever, congestion, fatigue, body aches, watering eyes. Took ibuprofen and alka seltzer for symptoms.

## 2022-04-30 NOTE — ED Notes (Signed)
Pt refused strep swab 

## 2022-04-30 NOTE — ED Provider Notes (Signed)
[facility] Provider Note   CSN: [id number] Arrival date & time: 04/30/22  1928     History  Chief Complaint  Patient presents with   Sore Throat    Danny Mccoy is a 37 y.o. male with a past medical history of HSV presents today for evaluation of sore throat over the weekend.  He denies any fever.  Endorses mild cough.  He was seen at urgent care today, was diagnosed with viral upper respiratory infection, he requested antibiotics but was told this is a viral infection that does not need antibiotics.  He came to the emergency department for reevaluation.   Sore Throat      Past Medical History:  Diagnosis Date   HSV-1 infection    History reviewed. No pertinent surgical history.   Home Medications Prior to Admission medications   Medication Sig Start Date End Date Taking? Authorizing Provider  albuterol (VENTOLIN HFA) 108 (90 Base) MCG/ACT inhaler Inhale 1-2 puffs into the lungs every 6 (six) hours as needed for wheezing or shortness of breath. 01/17/22   Nyoka Lint, PA-C      Allergies    Patient has no known allergies.    Review of Systems   Review of Systems Negative except as per HPI.  Physical Exam Updated Vital Signs BP (!) 135/92   Pulse 65   Temp 98.2 F (36.8 C) (Oral)   Resp 18   SpO2 100%  Physical Exam Vitals and nursing note reviewed.  Constitutional:      Appearance: Normal appearance.  HENT:     Head: Normocephalic and atraumatic.     Mouth/Throat:     Mouth: Mucous membranes are moist.  Eyes:     General: No scleral icterus. Cardiovascular:     Rate and Rhythm: Normal rate and regular rhythm.     Pulses: Normal pulses.     Heart sounds: Normal heart sounds.  Pulmonary:     Effort: Pulmonary effort is normal.     Breath sounds: Normal breath sounds.  Abdominal:     General: Abdomen is flat.     Palpations: Abdomen is soft.     Tenderness: There is no abdominal tenderness.   Musculoskeletal:        General: No deformity.  Skin:    General: Skin is warm.     Findings: No rash.  Neurological:     General: No focal deficit present.     Mental Status: He is alert.  Psychiatric:        Mood and Affect: Mood normal.     ED Results / Procedures / Treatments   Labs (all labs ordered are listed, but only abnormal results are displayed) Labs Reviewed  GROUP A STREP BY PCR  RESP PANEL BY RT-PCR (RSV, FLU A&B, COVID)  [id number]    EKG None  Radiology No results found.  Procedures Procedures    Medications Ordered in ED Medications - No data to display  ED Course/ Medical Decision Making/ A&P                             Medical Decision Making  This patient presents to the ED for sore throat, body aches, this involves an extensive number of treatment options, and is a complaint that carries with a high risk of complications and morbidity.  The differential diagnosis includes flu, COVID, RSV, strep, pharyngitis, bronchitis, pneumonia, infectious etiology.  This is not an exhaustive list.  Lab tests: I ordered and personally interpreted labs.  The pertinent results include: Viral panel negative.  Strep negative.  Imaging studies:  Problem list/ ED course/ Critical interventions/ Medical management: HPI: See above Vital signs within normal range and stable throughout visit. Laboratory/imaging studies significant for: See above. On physical examination, patient is afebrile and appears in no acute distress. This patient presents with symptoms suspicious for viral upper respiratory infection. Based on history and physical doubt sinusitis. COVID test was negative. Do not suspect underlying cardiopulmonary process. I considered, but think unlikely, pneumonia. Patient is nontoxic appearing and not in need of emergent medical intervention. Patient told to self isolate at home until symptoms subside for 72 hours. Recommended patient to take TheraFlu or Mucinex  for symptom relief.  Follow-up with primary care physician for further evaluation and management.  Patient disagreed with the results of his swabs today, states those are inaccurate and walked out of his room. Return to the ER if new or worsening symptoms. I have reviewed the patient home medicines and have made adjustments as needed.  Cardiac monitoring/EKG: The patient was maintained on a cardiac monitor.  I personally reviewed and interpreted the cardiac monitor which showed an underlying rhythm of: sinus rhythm.  Additional history obtained: External records from outside source obtained and reviewed including: Chart review including previous notes, labs, imaging.  Consultations obtained:  Disposition Patient eloped after knowing his viral panel and strep result.  This chart was dictated using voice recognition software.  Despite best efforts to proofread,  errors can occur which can change the documentation meaning.          Final Clinical Impression(s) / ED Diagnoses Final diagnoses:  Viral upper respiratory infection    Rx / DC Orders ED Discharge Orders     None         Rex Kras, [place] 04/30/22 2215    Teressa Lower, MD 05/01/22 1539

## 2022-04-30 NOTE — ED Provider Notes (Signed)
[facility]    CSN: [id number] Arrival date & time: 04/30/22  1732      History   Chief Complaint Chief Complaint  Patient presents with   Generalized Body Aches   Fatigue    HPI Danny Mccoy is a 37 y.o. male.  Unclear history as patient changed his story several times throughout visit 5 days of sore throat At first patient reported congestion and bodyaches, however apparently he never had those symptoms. No fevers. No cough. No GI symptoms.  He has taken Advil PM, alkaseltzer  Also took 3 leftover pills of doxycycline  Past Medical History:  Diagnosis Date   HSV-1 infection     Patient Active Problem List   Diagnosis Date Noted   AKI (acute kidney injury) ([place])    Syncope 11/06/2020   CONSTIPATION 06/30/2007    History reviewed. No pertinent surgical history.     Home Medications    Prior to Admission medications   Medication Sig Start Date End Date Taking? Authorizing Provider  albuterol (VENTOLIN HFA) 108 (90 Base) MCG/ACT inhaler Inhale 1-2 puffs into the lungs every 6 (six) hours as needed for wheezing or shortness of breath. 01/17/22   Nyoka Lint, PA-C    Family History Family History  Problem Relation Age of Onset   Healthy Mother    Healthy Father     Social History Social History   Tobacco Use   Smoking status: Never   Smokeless tobacco: Never   Tobacco comments:    black and mild 2x/week  Vaping Use   Vaping Use: Never used  Substance Use Topics   Alcohol use: Not Currently   Drug use: Never     Allergies   Patient has no known allergies.   Review of Systems Review of Systems As per HPI  Physical Exam Triage Vital Signs ED Triage Vitals  Enc Vitals Group     BP 04/30/22 1815 117/80     Pulse Rate 04/30/22 1815 66     Resp 04/30/22 1815 16     Temp 04/30/22 1815 98.3 F (36.8 C)     Temp Source 04/30/22 1815 Oral     SpO2 04/30/22 1815 96 %     Weight --      Height --      Head Circumference  --      Peak Flow --      Pain Score 04/30/22 1814 0     Pain Loc --      Pain Edu? --      Excl. in [place]? --    No data found.  Updated Vital Signs BP 117/80 (BP Location: Left Arm)   Pulse 66   Temp 98.3 F (36.8 C) (Oral)   Resp 16   SpO2 96%    Physical Exam Vitals and nursing note reviewed.  Constitutional:      General: He is not in acute distress. HENT:     Ears:     Comments: Wax in bilat canals     Nose: No congestion or rhinorrhea.     Mouth/Throat:     Mouth: Mucous membranes are moist.     Pharynx: Oropharynx is clear. No posterior oropharyngeal erythema.     Tonsils: No tonsillar exudate or tonsillar abscesses. 0 on the right. 0 on the left.  Eyes:     Conjunctiva/sclera: Conjunctivae normal.  Cardiovascular:     Rate and Rhythm: Normal rate and regular rhythm.     Pulses:  Normal pulses.     Heart sounds: Normal heart sounds.  Pulmonary:     Effort: Pulmonary effort is normal.     Breath sounds: Normal breath sounds.  Musculoskeletal:     Cervical back: Normal range of motion.  Lymphadenopathy:     Cervical: No cervical adenopathy.  Skin:    General: Skin is warm and dry.  Neurological:     Mental Status: He is alert and oriented to person, place, and time.     UC Treatments / Results  Labs (all labs ordered are listed, but only abnormal results are displayed) Labs Reviewed - No data to display   EKG   Radiology No results found.  Procedures Procedures (including critical care time)  Medications Ordered in UC Medications - No data to display  Initial Impression / Assessment and Plan / UC Course  I have reviewed the triage vital signs and the nursing notes.  Pertinent labs & imaging results that were available during my care of the patient were reviewed by me and considered in my medical decision making (see chart for details).  Afebrile, well appearing. Overall unremarkable exam  I offered strep swab but patient reports "he can't  do that". Attempt was made but patient unable. Low concern for bacterial infection given appearence. Centor score is 1 given absence of cough. Discussed this with patient.   He is requesting antibiotics. We had 10 minute discussion about viral illness and how antibiotics do not treat them. We discussed symptomatic care in depth. Patient is frustrated because he feels like the 3 tablets of doxycycline helped him feel better. We discussed this medicine does not treat viral infections and wouldn't help if he had a strep infection.  Advised to continue symptomatic care and provided additional options for him to try at home.  Final Clinical Impressions(s) / UC Diagnoses   Final diagnoses:  Viral illness     Discharge Instructions      You have a viral illness causing symptoms. This is treated with symptomatic care. I recommend to use tylenol and ibuprofen every 4-6 hours for sore throat. You can try salt water gargles, honey, throat lozenges.  You can add a nasal spray which may help sore throat as well. Make sure you are drinking at least 64 oz of water DAILY As discussed, this is not treated with antibiotics. Please continue symptomatic care for a few more days and you should note improvement. I have attached a work note for you.      ED Prescriptions   None    PDMP not reviewed this encounter.   Kyra Leyland 04/30/22 1955

## 2022-04-30 NOTE — ED Triage Notes (Signed)
Pt states that he has been having a sore throat since the weekend with no fevers.

## 2022-04-30 NOTE — Discharge Instructions (Addendum)
You have a viral illness causing symptoms. This is treated with symptomatic care. I recommend to use tylenol and ibuprofen every 4-6 hours for sore throat. You can try salt water gargles, honey, throat lozenges.  You can add a nasal spray which may help sore throat as well. Make sure you are drinking at least 64 oz of water DAILY As discussed, this is not treated with antibiotics. Please continue symptomatic care for a few more days and you should note improvement. I have attached a work note for you.

## 2022-06-29 ENCOUNTER — Encounter (HOSPITAL_COMMUNITY): Payer: Self-pay | Admitting: Emergency Medicine

## 2022-06-29 ENCOUNTER — Ambulatory Visit (HOSPITAL_COMMUNITY)
Admission: EM | Admit: 2022-06-29 | Discharge: 2022-06-29 | Disposition: A | Discharge status: Home / Self Care | Payer: Self-pay | Attending: Nurse Practitioner | Admitting: Nurse Practitioner

## 2022-06-29 DIAGNOSIS — J029 Acute pharyngitis, unspecified: Secondary | ICD-10-CM

## 2022-06-29 MED ORDER — PREDNISONE 20 MG PO TABS: 40.0000 mg | ORAL_TABLET | Freq: Every day | ORAL | 0 refills | Status: AC

## 2022-06-29 MED ORDER — AMOXICILLIN 500 MG PO TABS: 500.0000 mg | ORAL_TABLET | Freq: Two times a day (BID) | ORAL | 0 refills | Status: AC

## 2022-06-29 NOTE — Discharge Instructions (Addendum)
Pharyngitis is inflammation of the throat. It is a very common cause of sore throat. Pharyngitis can be caused by a bacteria, but it is usually caused by a virus.  You were unable to tolerate a throat swab which would have let us know if your sore throat is due to a bacterial infection.  To be on the safe side, we will go ahead and treat you with a course of antibiotics given that your throat is red, swollen and you have a low-grade fever here in the clinic.  Make sure you finish all the antibiotics even if you start to feel better.  Drink plenty of fluids.  You can gargle with warm salt water to help with the pain.  He can also take Tylenol and/or ibuprofen.  Make sure you change her toothbrush after completing the antibiotics.

## 2022-06-29 NOTE — ED Triage Notes (Signed)
Pt c/o sore throat for 2 days. Denies taking medications for symptoms.

## 2022-06-29 NOTE — ED Notes (Signed)
This RN attempted to obtain tonsil swab for strep test that is ordered. Patient unable to allow this RN to swab throat.  Will make Sam, NP aware.

## 2022-06-29 NOTE — ED Provider Notes (Signed)
[facility]    CSN: [id number] Arrival date & time: 06/29/22  1930      History   Chief Complaint Chief Complaint  Patient presents with   Sore Throat    HPI Danny Mccoy is a 37 y.o. male.   Subjective:   Danny Mccoy is a 37 y.o. male who presents for evaluation of a sore throat. He denies any other associating symptoms. No chills, headache, myalgias, productive cough, sinus/nasal congestion, sneezing, swollen glands, or white spots in throat. Onset of symptoms was 2 days ago and gradually worsening since that time.  He is drinking plenty of fluids. He has not had recent close exposure to someone with proven streptococcal pharyngitis.  The following portions of the patient's history were reviewed and updated as appropriate: allergies, current medications, past family history, past medical history, past social history, past surgical history, and problem list.      Past Medical History:  Diagnosis Date   HSV-1 infection     Patient Active Problem List   Diagnosis Date Noted   AKI (acute kidney injury) ([facility])    Syncope 11/06/2020   CONSTIPATION 06/30/2007    History reviewed. No pertinent surgical history.     Home Medications    Prior to Admission medications   Medication Sig Start Date End Date Taking? Authorizing Provider  amoxicillin (AMOXIL) 500 MG tablet Take 1 tablet (500 mg total) by mouth 2 (two) times daily for 10 days. 06/29/22 07/09/22 Yes Lurline Idol, FNP  predniSONE (DELTASONE) 20 MG tablet Take 2 tablets (40 mg total) by mouth daily for 5 days. 06/29/22 07/04/22 Yes Lurline Idol, FNP  albuterol (VENTOLIN HFA) 108 (90 Base) MCG/ACT inhaler Inhale 1-2 puffs into the lungs every 6 (six) hours as needed for wheezing or shortness of breath. 01/17/22   Ellsworth Lennox, PA-C    Family History Family History  Problem Relation Age of Onset   Healthy Mother    Healthy Father     Social History Social History    Tobacco Use   Smoking status: Never   Smokeless tobacco: Never   Tobacco comments:    black and mild 2x/week  Vaping Use   Vaping Use: Never used  Substance Use Topics   Alcohol use: Not Currently   Drug use: Never     Allergies   Patient has no known allergies.   Review of Systems Review of Systems  Constitutional:  Negative for fever.  HENT:  Positive for sore throat. Negative for congestion, sinus pain and trouble swallowing.   Respiratory:  Negative for cough and shortness of breath.   Gastrointestinal:  Negative for nausea and vomiting.  Musculoskeletal:  Negative for myalgias.  Neurological:  Negative for headaches.  All other systems reviewed and are negative.    Physical Exam Triage Vital Signs ED Triage Vitals  Enc Vitals Group     BP 06/29/22 2017 114/77     Pulse Rate 06/29/22 2017 60     Resp 06/29/22 2017 16     Temp 06/29/22 2017 99.6 F (37.6 C)     Temp Source 06/29/22 2017 Oral     SpO2 06/29/22 2017 98 %     Weight --      Height --      Head Circumference --      Peak Flow --      Pain Score 06/29/22 2016 8     Pain Loc --      Pain Edu? --  Excl. in [place]? --    No data found.  Updated Vital Signs BP 114/77 (BP Location: Left Arm)   Pulse 60   Temp 99.6 F (37.6 C) (Oral)   Resp 16   SpO2 98%   Visual Acuity Right Eye Distance:   Left Eye Distance:   Bilateral Distance:    Right Eye Near:   Left Eye Near:    Bilateral Near:     Physical Exam Vitals reviewed.  Constitutional:      General: He is not in acute distress.    Appearance: He is well-developed. He is not ill-appearing, toxic-appearing or diaphoretic.  HENT:     Head: Normocephalic.     Nose: Nose normal.     Mouth/Throat:     Lips: Pink.     Mouth: Mucous membranes are dry.     Pharynx: Uvula midline. Pharyngeal swelling and posterior oropharyngeal erythema present. No oropharyngeal exudate or uvula swelling.     Tonsils: No tonsillar exudate or  tonsillar abscesses.  Eyes:     Conjunctiva/sclera: Conjunctivae normal.  Cardiovascular:     Rate and Rhythm: Normal rate.  Pulmonary:     Effort: Pulmonary effort is normal.  Musculoskeletal:        General: Normal range of motion.     Cervical back: Normal range of motion and neck supple.  Lymphadenopathy:     Cervical: Cervical adenopathy present.  Skin:    General: Skin is warm and dry.  Neurological:     General: No focal deficit present.     Mental Status: He is alert and oriented to person, place, and time.      UC Treatments / Results  Labs (all labs ordered are listed, but only abnormal results are displayed) Labs Reviewed - No data to display   EKG   Radiology No results found.  Procedures Procedures (including critical care time)  Medications Ordered in UC Medications - No data to display  Initial Impression / Assessment and Plan / UC Course  I have reviewed the triage vital signs and the nursing notes.  Pertinent labs & imaging results that were available during my care of the patient were reviewed by me and considered in my medical decision making (see chart for details).    37 yo male presenting with a two-day history of sore throat.  Denies any fevers at home but actually has a low-grade fever here in the clinic of 99.6.  All other vitals are unremarkable.  Physical exam as above.  Patient was unable to tolerate a tonsil swab for strep test.  Given his low-grade fever and red/swollen tonsils, will empirically treat for strep with 10-day course of amoxicillin.  5-day course of prednisone also prescribed for a viral etiology.  Supportive care measures discussed.  Today's evaluation has revealed no signs of a dangerous process. Discussed diagnosis with patient and/or guardian. Patient and/or guardian aware of their diagnosis, possible red flag symptoms to watch out for and need for close follow up. Patient and/or guardian understands verbal and written  discharge instructions. Patient and/or guardian comfortable with plan and disposition.  Patient and/or guardian has a clear mental status at this time, good insight into illness (after discussion and teaching) and has clear judgment to make decisions regarding their care  Documentation was completed with the aid of voice recognition software. Transcription may contain typographical errors. Final Clinical Impressions(s) / UC Diagnoses   Final diagnoses:  Sore throat     Discharge Instructions  Pharyngitis is inflammation of the throat. It is a very common cause of sore throat. Pharyngitis can be caused by a bacteria, but it is usually caused by a virus.  You were unable to tolerate a throat swab which would have let us know if your sore throat is due to a bacterial infection.  To be on the safe side, we will go ahead and treat you with a course of antibiotics given that your throat is red, swollen and you have a low-grade fever here in the clinic.  Make sure you finish all the antibiotics even if you start to feel better.  Drink plenty of fluids.  You can gargle with warm salt water to help with the pain.  He can also take Tylenol and/or ibuprofen.  Make sure you change her toothbrush after completing the antibiotics.     ED Prescriptions     Medication Sig Dispense Auth. Provider   amoxicillin (AMOXIL) 500 MG tablet Take 1 tablet (500 mg total) by mouth 2 (two) times daily for 10 days. 20 tablet Lurline Idol, FNP   predniSONE (DELTASONE) 20 MG tablet Take 2 tablets (40 mg total) by mouth daily for 5 days. 10 tablet Lurline Idol, FNP      PDMP not reviewed this encounter.   Lurline Idol, [place] 06/29/22 2041

## 2022-08-03 ENCOUNTER — Encounter (HOSPITAL_COMMUNITY): Payer: Self-pay | Admitting: *Deleted

## 2022-08-03 ENCOUNTER — Other Ambulatory Visit: Payer: Self-pay

## 2022-08-03 ENCOUNTER — Emergency Department (HOSPITAL_COMMUNITY)
Admission: EM | Admit: 2022-08-03 | Discharge: 2022-08-03 | Disposition: A | Discharge status: Home / Self Care | Payer: Medicaid Other | Attending: Emergency Medicine | Admitting: Emergency Medicine

## 2022-08-03 DIAGNOSIS — R300 Dysuria: Secondary | ICD-10-CM | POA: Diagnosis not present

## 2022-08-03 DIAGNOSIS — M545 Low back pain, unspecified: Secondary | ICD-10-CM | POA: Diagnosis not present

## 2022-08-03 DIAGNOSIS — Z202 Contact with and (suspected) exposure to infections with a predominantly sexual mode of transmission: Secondary | ICD-10-CM | POA: Insufficient documentation

## 2022-08-03 DIAGNOSIS — R369 Urethral discharge, unspecified: Secondary | ICD-10-CM | POA: Insufficient documentation

## 2022-08-03 LAB — URINALYSIS, ROUTINE W REFLEX MICROSCOPIC
Bilirubin Urine: NEGATIVE
Glucose, UA: NEGATIVE mg/dL
Ketones, ur: NEGATIVE mg/dL
Nitrite: NEGATIVE
Protein, ur: 30 mg/dL — AB
Specific Gravity, Urine: 1.023 (ref 1.005–1.030)
WBC, UA: >50 WBC/hpf (ref 0–5)
pH: 5 (ref 5.0–8.0)

## 2022-08-03 LAB — RAPID HIV SCREEN (HIV 1/2 AB+AG)
HIV 1/2 Antibodies: NONREACTIVE
HIV-1 P24 Antigen - HIV24: NONREACTIVE

## 2022-08-03 MED ORDER — LIDOCAINE HCL (PF) 1 % IJ SOLN
INTRAMUSCULAR | Status: AC
  Filled 2022-08-03: qty 5

## 2022-08-03 MED ORDER — METRONIDAZOLE 500 MG PO TABS
2000.0000 mg | ORAL_TABLET | Freq: Once | ORAL | Status: AC
  Administered 2022-08-03: 2000 mg via ORAL
  Filled 2022-08-03: qty 4

## 2022-08-03 MED ORDER — DOXYCYCLINE HYCLATE 100 MG PO TABS
100.0000 mg | ORAL_TABLET | Freq: Once | ORAL | Status: AC
  Administered 2022-08-03: 100 mg via ORAL
  Filled 2022-08-03: qty 1

## 2022-08-03 MED ORDER — DOXYCYCLINE HYCLATE 100 MG PO CAPS: 100.0000 mg | ORAL_CAPSULE | Freq: Two times a day (BID) | ORAL | 0 refills | Status: AC

## 2022-08-03 MED ORDER — CEFTRIAXONE SODIUM 500 MG IJ SOLR
500.0000 mg | Freq: Once | INTRAMUSCULAR | Status: AC
  Administered 2022-08-03: 500 mg via INTRAMUSCULAR
  Filled 2022-08-03: qty 500

## 2022-08-03 NOTE — Discharge Instructions (Addendum)
Thank you for letting us take care of you today.  Your HIV test was negative. Your urine did appear infected. The tests for syphilis, gonorrhea, and chlamydia will not result today. We went ahead and gave you antibiotics to treat for the most common STDs. Please take these as prescribed even if you start feeling better.   For continued symptoms, follow up with your PCP for re-evaluation. For new or worsening symptoms such as testicular swelling, significant pain, fever, abdominal pain, or other new, concerning symptoms, return to nearest ED immediately for re-evaluation.

## 2022-08-03 NOTE — ED Triage Notes (Signed)
Pt states burning with urination for about 2-3 weeks. Reports clear penile discharge. Pt reports lower abdominal and lower back pain. Pt has had unprotected sex.

## 2022-08-03 NOTE — ED Provider Notes (Cosign Needed Addendum)
[place] EMERGENCY DEPARTMENT AT [facility] Provider Note   CSN: [id number] Arrival date & time: 08/03/22  1135     History  No chief complaint on file.   Danny Mccoy is a 37 y.o. male who presents to the ED complaining of penile discharge, dysuria, and lower back pain for the last week.  He states that he had a new sexual encounter and believes that he has been exposed to an STD.  He denies fever, abdominal pain, nausea, vomiting, diarrhea, scrotal swelling, or other complaints.  No recent antibiotics.    Home Medications Prior to Admission medications   Medication Sig Start Date End Date Taking? Authorizing Provider  doxycycline (VIBRAMYCIN) 100 MG capsule Take 1 capsule (100 mg total) by mouth 2 (two) times daily for 7 days. 08/04/22 08/11/22 Yes Cale Bethard L, PA-C  albuterol (VENTOLIN HFA) 108 (90 Base) MCG/ACT inhaler Inhale 1-2 puffs into the lungs every 6 (six) hours as needed for wheezing or shortness of breath. 01/17/22   Ellsworth Lennox, PA-C      Allergies    Patient has no known allergies.    Review of Systems   Review of Systems  All other systems reviewed and are negative.   Physical Exam Updated Vital Signs BP 120/74 (BP Location: Left Arm)   Pulse 65   Temp 98.6 F (37 C) (Oral)   Resp 18   Ht 6' (1.829 m)   Wt 90.7 kg   SpO2 99%   BMI 27.12 kg/m  Physical Exam Vitals and nursing note reviewed.  Constitutional:      General: He is not in acute distress.    Appearance: He is well-developed. He is not ill-appearing, toxic-appearing or diaphoretic.  HENT:     Head: Normocephalic and atraumatic.  Eyes:     General: No scleral icterus.    Conjunctiva/sclera: Conjunctivae normal.  Cardiovascular:     Rate and Rhythm: Normal rate and regular rhythm.     Heart sounds: No murmur heard. Pulmonary:     Effort: Pulmonary effort is normal. No respiratory distress.     Breath sounds: Normal breath sounds.  Abdominal:     General:  There is no distension.     Palpations: Abdomen is soft. There is no mass.     Tenderness: There is no abdominal tenderness. There is no right CVA tenderness, left CVA tenderness, guarding or rebound.  Musculoskeletal:        General: No swelling. Normal range of motion.     Cervical back: Normal range of motion and neck supple.     Comments: No midline CTL spinal tenderness, stepoffs, or deformities  Skin:    General: Skin is warm and dry.     Capillary Refill: Capillary refill takes less than 2 seconds.     Coloration: Skin is not jaundiced or pale.     Findings: No rash.  Neurological:     General: No focal deficit present.     Mental Status: He is alert and oriented to person, place, and time.  Psychiatric:        Mood and Affect: Mood normal.        Behavior: Behavior normal.     ED Results / Procedures / Treatments   Labs (all labs ordered are listed, but only abnormal results are displayed) Labs Reviewed  URINALYSIS, ROUTINE W REFLEX MICROSCOPIC - Abnormal; Notable for the following components:      Result Value   APPearance HAZY (*)  Hgb urine dipstick SMALL (*)    Protein, ur 30 (*)    Leukocytes,Ua LARGE (*)    Bacteria, UA MANY (*)    All other components within normal limits  RAPID HIV SCREEN (HIV 1/2 AB+AG)  RPR  GC/CHLAMYDIA PROBE AMP ([facility]) NOT AT [facility]    EKG None  Radiology No results found.  Procedures Procedures    Medications Ordered in ED Medications  cefTRIAXone (ROCEPHIN) injection 500 mg (has no administration in time range)  doxycycline (VIBRA-TABS) tablet 100 mg (has no administration in time range)  metroNIDAZOLE (FLAGYL) tablet 2,000 mg (has no administration in time range)    ED Course/ Medical Decision Making/ A&P                             Medical Decision Making Amount and/or Complexity of Data Reviewed Labs: ordered. Decision-making details documented in ED Course.  Risk Prescription drug  management.   Medical Decision Making:   Danny Mccoy is a 37 y.o. male who presented to the ED today with penile discharge detailed above.    Patient's presentation is complicated by their history of new sexual partner.  Complete initial physical exam performed, notably the patient was in NAD. Abdomen soft, nontender. No CVA tenderness. Neurologically intact.    Reviewed and confirmed nursing documentation for past medical history, family history, social history.    Initial Assessment:   With the patient's presentation, differential diagnosis includes but is not limited to gonorrhea, chlamydia, trichomoniasis, UTI, pyelonephritis, testicular torsion, acute abdomen.    This is most consistent with an acute complicated illness  Initial Plan:  Urinalysis with reflex culture ordered to evaluate for UTI or relevant urologic/nephrologic pathology.  GC pending HIV, RPR as part of STD panel Objective evaluation as below reviewed   Initial Study Results:   Laboratory  All laboratory results reviewed without evidence of clinically relevant pathology.   Exceptions include: UA with large leuks  Final Assessment and Plan:   37 year old male presents to the ED complaining of penile discharge for the last week.  He notes likely exposure to STD.  No fever.  Abdomen soft and nontender.  Associated lower back pain.  No midline spinal tenderness.  No CVA tenderness.  Patient nontoxic-appearing.  Vital signs reassuring.  UA with large amount of leukocytes.  HIV negative.  RPR, GC also pending but likely will not result today.  Discussed UA findings with patient and he would like to proceed with empiric treatment for STDs.  Declined further exam or scrotal ultrasound.  Agreeable to antibiotic therapy and follow-up as needed.  Strict ED return precautions given, all questions answered, and stable for discharge.   Clinical Impression:  1. Exposure to STD   2. Penile discharge      Data  Unavailable           Final Clinical Impression(s) / ED Diagnoses Final diagnoses:  Penile discharge  Exposure to STD    Rx / DC Orders ED Discharge Orders          Ordered    doxycycline (VIBRAMYCIN) 100 MG capsule  2 times daily        08/03/22 1622              Tonette Lederer, PA-C 08/03/22 1628    Tonette Lederer, PA-C 08/03/22 1639    Pricilla Loveless, MD 08/04/22 [phone]

## 2022-08-03 NOTE — ED Provider Triage Note (Cosign Needed Addendum)
Emergency Medicine Provider Triage Evaluation Note  Danny Mccoy , a 37 y.o. male  was evaluated in triage.  Pt complains of penile discharge and dysuria for the past two weeks. Reports urinary urgency/frequency, but no polyuria. No fever or rashes. Denies any abdominal pain, nausea or vomiting. Reports pain with sex. Reports pain in testicles with ejaculation. No swelling .  Review of Systems  Positive:  Negative:   Physical Exam  BP 120/74 (BP Location: Left Arm)   Pulse 65   Temp 98.6 F (37 C) (Oral)   Resp 18   Ht 6' (1.829 m)   Wt 90.7 kg   SpO2 99%   BMI 27.12 kg/m  Gen:   Awake, no distress   Resp:  Normal effort  MSK:   Moves extremities without difficulty  Other:  No CVA tenderness. Does not appear any acute distress.   Medical Decision Making  Medically screening exam initiated at 1:25 PM.  Appropriate orders placed.  Danny Mccoy was informed that the remainder of the evaluation will be completed by another provider, this initial triage assessment does not replace that evaluation, and the importance of remaining in the ED until their evaluation is complete.  STD panel and urine ordered.    Achille Rich, PA-C 08/03/22 1327    Achille Rich, PA-C 08/03/22 1328

## 2022-08-04 LAB — GC/CHLAMYDIA PROBE AMP (~~LOC~~) NOT AT ARMC
Chlamydia: NEGATIVE
Comment: NEGATIVE
Comment: NORMAL
Neisseria Gonorrhea: NEGATIVE

## 2022-08-04 LAB — RPR: RPR Ser Ql: NONREACTIVE

## 2022-08-24 ENCOUNTER — Encounter (HOSPITAL_COMMUNITY): Payer: Self-pay

## 2022-08-24 ENCOUNTER — Ambulatory Visit (HOSPITAL_COMMUNITY)
Admission: EM | Admit: 2022-08-24 | Discharge: 2022-08-24 | Disposition: A | Discharge status: Home / Self Care | Payer: Medicaid Other | Attending: Physician Assistant | Admitting: Physician Assistant

## 2022-08-24 DIAGNOSIS — J069 Acute upper respiratory infection, unspecified: Secondary | ICD-10-CM | POA: Insufficient documentation

## 2022-08-24 DIAGNOSIS — Z8616 Personal history of COVID-19: Secondary | ICD-10-CM | POA: Insufficient documentation

## 2022-08-24 DIAGNOSIS — R5383 Other fatigue: Secondary | ICD-10-CM | POA: Diagnosis present

## 2022-08-24 DIAGNOSIS — R051 Acute cough: Secondary | ICD-10-CM

## 2022-08-24 DIAGNOSIS — Z1152 Encounter for screening for COVID-19: Secondary | ICD-10-CM | POA: Insufficient documentation

## 2022-08-24 MED ORDER — PROMETHAZINE-DM 6.25-15 MG/5ML PO SYRP: 5.0000 mL | ORAL_SOLUTION | Freq: Two times a day (BID) | ORAL | 0 refills | Status: DC | PRN

## 2022-08-24 NOTE — ED Triage Notes (Signed)
Pt presents with fatigue and body aches x 3 days. Employer requested he come for covid testing.

## 2022-08-24 NOTE — Discharge Instructions (Addendum)
Monitor your MyChart for your COVID results.  We will contact you if these are positive.  Use promethazine DM for cough.  This make you sleepy so do not drive or drink alcohol taking it.  Make sure that you rest and drink plenty of fluids.  If your symptoms are not improving within a week please return for reevaluation.  If anything worsens you have shortness of breath, worsening fatigue, weakness, nausea, vomiting, worsening cough you need to be seen immediately.

## 2022-08-24 NOTE — ED Provider Notes (Signed)
[facility]    CSN: [id number] Arrival date & time: 08/24/22  1834      History   Chief Complaint Chief Complaint  Patient presents with   Fatigue    HPI Danny Mccoy is a 37 y.o. male.   Patient presents today with 3-day history of fatigue and cough.  Reports that he has been feeling poorly but denies any additional symptoms including fever, nausea, vomiting, chest pain, shortness of breath, congestion.  Denies any known sick contacts.  He has had COVID in the past with last episode several years ago.  He has had COVID-19 vaccinations.  His employer is requesting COVID testing.  He denies any history of asthma, COPD, smoking, allergies.  He is currently taking ciprofloxacin for prostatitis but denies additional antibiotics in the past 90 days.  He is eating and drinking normally.  He has been his work as a result of symptoms and is requesting a work excuse note.    Past Medical History:  Diagnosis Date   HSV-1 infection     Patient Active Problem List   Diagnosis Date Noted   AKI (acute kidney injury) (HCC)    Syncope 11/06/2020   CONSTIPATION 06/30/2007    History reviewed. No pertinent surgical history.     Home Medications    Prior to Admission medications   Medication Sig Start Date End Date Taking? Authorizing Provider  albuterol (VENTOLIN HFA) 108 (90 Base) MCG/ACT inhaler Inhale 1-2 puffs into the lungs every 6 (six) hours as needed for wheezing or shortness of breath. 01/17/22  Yes Ellsworth Lennox, PA-C  ciprofloxacin (CIPRO) 500 MG tablet Take 500 mg by mouth 2 (two) times daily. 08/03/22  Yes [provider]  promethazine-dextromethorphan (PROMETHAZINE-DM) 6.25-15 MG/5ML syrup Take 5 mLs by mouth 2 (two) times daily as needed for cough. 08/24/22  Yes Phylisha Dix, Noberto Retort, PA-C    Family History Family History  Problem Relation Age of Onset   Healthy Mother    Healthy Father     Social History Social History   Tobacco Use    Smoking status: Never   Smokeless tobacco: Never   Tobacco comments:    black and mild 2x/week  Vaping Use   Vaping status: Never Used  Substance Use Topics   Alcohol use: Not Currently   Drug use: Never     Allergies   Patient has no known allergies.   Review of Systems Review of Systems  Constitutional:  Positive for activity change and fatigue. Negative for appetite change and fever.  HENT:  Negative for congestion, sinus pressure, sneezing and sore throat.   Respiratory:  Positive for cough. Negative for shortness of breath.   Cardiovascular:  Negative for chest pain.  Gastrointestinal:  Negative for abdominal pain, diarrhea, nausea and vomiting.  Neurological:  Negative for dizziness, light-headedness and headaches.     Physical Exam Triage Vital Signs ED Triage Vitals [08/24/22 1914]  Encounter Vitals Group     BP 106/63     Systolic BP Percentile      Diastolic BP Percentile      Pulse Rate 95     Resp 16     Temp 98.5 F (36.9 C)     Temp Source Oral     SpO2 96 %     Weight      Height      Head Circumference      Peak Flow      Pain Score  Pain Loc      Pain Education      Exclude from Growth Chart    No data found.  Updated Vital Signs BP 106/63 (BP Location: Left Arm)   Pulse 95   Temp 98.5 F (36.9 C) (Oral)   Resp 16   SpO2 96%   Visual Acuity Right Eye Distance:   Left Eye Distance:   Bilateral Distance:    Right Eye Near:   Left Eye Near:    Bilateral Near:     Physical Exam Vitals reviewed.  Constitutional:      General: He is awake.     Appearance: Normal appearance. He is well-developed. He is not ill-appearing.     Comments: Very pleasant male appears stated age in no acute distress sitting comfortably in exam room  HENT:     Head: Normocephalic and atraumatic.     Right Ear: Tympanic membrane, ear canal and external ear normal. Tympanic membrane is not erythematous or bulging.     Left Ear: Tympanic membrane, ear  canal and external ear normal. Tympanic membrane is not erythematous or bulging.     Nose: Nose normal.     Mouth/Throat:     Pharynx: Uvula midline. Posterior oropharyngeal erythema present. No oropharyngeal exudate or uvula swelling.  Cardiovascular:     Rate and Rhythm: Normal rate and regular rhythm.     Heart sounds: Normal heart sounds, S1 normal and S2 normal. No murmur heard. Pulmonary:     Effort: Pulmonary effort is normal. No accessory muscle usage or respiratory distress.     Breath sounds: Normal breath sounds. No stridor. No wheezing, rhonchi or rales.     Comments: Clear to auscultation bilaterally Neurological:     Mental Status: He is alert.  Psychiatric:        Behavior: Behavior is cooperative.      UC Treatments / Results  Labs (all labs ordered are listed, but only abnormal results are displayed) Labs Reviewed  SARS CORONAVIRUS 2 (TAT 6-24 HRS)    EKG   Radiology No results found.  Procedures Procedures (including critical care time)  Medications Ordered in UC Medications - No data to display  Initial Impression / Assessment and Plan / UC Course  I have reviewed the triage vital signs and the nursing notes.  Pertinent labs & imaging results that were available during my care of the patient were reviewed by me and considered in my medical decision making (see chart for details).     Patient is well-appearing, afebrile, nontoxic, nontachycardic.  COVID testing was obtained and is pending.  We will contact him if this is positive.  He is young and otherwise healthy is not a candidate for antiviral therapy.  No evidence of acute infection on physical exam that want initiation of antibiotics.  X-ray was deferred as he has no adventitious lung sounds on exam and oxygen saturation is 96%.  He was given Promethazine DM for cough.  We discussed that this can be sedating and is not to drive or drink alcohol with taking it.  Can use over-the-counter medications  including Mucinex, Flonase, Tylenol for additional symptom relief.  Also recommended that he rest and drink plenty of fluids.  If symptoms not improving within a week he needs to be reevaluated.  Discussed that if he has any worsening symptoms he should be seen immediately including shortness of breath, worsening fatigue, nausea, vomiting, weakness, worsening cough.  Strict return precautions given.  Work excuse note  provided.  Final Clinical Impressions(s) / UC Diagnoses   Final diagnoses:  Upper respiratory tract infection, unspecified type  Fatigue, unspecified type  Acute cough     Discharge Instructions      Monitor your MyChart for your COVID results.  We will contact you if these are positive.  Use promethazine DM for cough.  This make you sleepy so do not drive or drink alcohol taking it.  Make sure that you rest and drink plenty of fluids.  If your symptoms are not improving within a week please return for reevaluation.  If anything worsens you have shortness of breath, worsening fatigue, weakness, nausea, vomiting, worsening cough you need to be seen immediately.     ED Prescriptions     Medication Sig Dispense Auth. Provider   promethazine-dextromethorphan (PROMETHAZINE-DM) 6.25-15 MG/5ML syrup Take 5 mLs by mouth 2 (two) times daily as needed for cough. 118 mL Ardean Simonich K, PA-C      PDMP not reviewed this encounter.   Jeani Hawking, PA-C 08/24/22 1955

## 2022-08-25 LAB — SARS CORONAVIRUS 2 (TAT 6-24 HRS): SARS Coronavirus 2: NEGATIVE

## 2022-09-07 ENCOUNTER — Encounter (HOSPITAL_COMMUNITY): Payer: Self-pay | Admitting: Emergency Medicine

## 2022-09-07 ENCOUNTER — Ambulatory Visit (HOSPITAL_COMMUNITY)
Admission: EM | Admit: 2022-09-07 | Discharge: 2022-09-07 | Disposition: A | Discharge status: Home / Self Care | Payer: Medicaid Other | Attending: Emergency Medicine | Admitting: Emergency Medicine

## 2022-09-07 DIAGNOSIS — J4520 Mild intermittent asthma, uncomplicated: Secondary | ICD-10-CM

## 2022-09-07 MED ORDER — ALBUTEROL SULFATE HFA 108 (90 BASE) MCG/ACT IN AERS: 1.0000 | INHALATION_SPRAY | Freq: Four times a day (QID) | RESPIRATORY_TRACT | 0 refills | Status: DC | PRN

## 2022-09-07 MED ORDER — PREDNISONE 20 MG PO TABS: 40.0000 mg | ORAL_TABLET | Freq: Every day | ORAL | 0 refills | Status: DC

## 2022-09-07 NOTE — ED Triage Notes (Signed)
Pt c/o cough, wheezing, fatigue that started this weekend. Hasn't taken any medications.

## 2022-09-07 NOTE — ED Provider Notes (Signed)
[facility]    CSN: [id number] Arrival date & time: 09/07/22  1856      History   Chief Complaint Chief Complaint  Patient presents with   Cough   Fatigue    HPI Danny Mccoy is a 37 y.o. male.   Patient presents for evaluation of a nonproductive cough, shortness of breath with exertion and intermittent wheezing beginning 2 days ago after playing basketball.  Has not attempted treatment.  History of asthma, endorses that he ran out of albuterol inhaler.  Denies fever or URI symptoms.  No known sick contacts prior.     Past Medical History:  Diagnosis Date   HSV-1 infection     Patient Active Problem List   Diagnosis Date Noted   AKI (acute kidney injury) (HCC)    Syncope 11/06/2020   CONSTIPATION 06/30/2007    History reviewed. No pertinent surgical history.     Home Medications    Prior to Admission medications   Medication Sig Start Date End Date Taking? Authorizing Provider  albuterol (VENTOLIN HFA) 108 (90 Base) MCG/ACT inhaler Inhale 1-2 puffs into the lungs every 6 (six) hours as needed for wheezing or shortness of breath. 01/17/22   Ellsworth Lennox, PA-C  ciprofloxacin (CIPRO) 500 MG tablet Take 500 mg by mouth 2 (two) times daily. 08/03/22   [provider]  promethazine-dextromethorphan (PROMETHAZINE-DM) 6.25-15 MG/5ML syrup Take 5 mLs by mouth 2 (two) times daily as needed for cough. 08/24/22   Raspet, Noberto Retort, PA-C    Family History Family History  Problem Relation Age of Onset   Healthy Mother    Healthy Father     Social History Social History   Tobacco Use   Smoking status: Never   Smokeless tobacco: Never   Tobacco comments:    black and mild 2x/week  Vaping Use   Vaping status: Never Used  Substance Use Topics   Alcohol use: Not Currently   Drug use: Never     Allergies   Patient has no known allergies.   Review of Systems Review of Systems  Constitutional: Negative.   HENT: Negative.    Respiratory:   Positive for cough, shortness of breath and wheezing. Negative for apnea, choking, chest tightness and stridor.   Cardiovascular: Negative.   Gastrointestinal: Negative.   Skin: Negative.   Neurological: Negative.      Physical Exam Triage Vital Signs ED Triage Vitals [09/07/22 2000]  Encounter Vitals Group     BP 118/79     Systolic BP Percentile      Diastolic BP Percentile      Pulse Rate 84     Resp 16     Temp 97.6 F (36.4 C)     Temp Source Oral     SpO2 97 %     Weight      Height      Head Circumference      Peak Flow      Pain Score 0     Pain Loc      Pain Education      Exclude from Growth Chart    No data found.  Updated Vital Signs BP 118/79 (BP Location: Right Arm)   Pulse 84   Temp 97.6 F (36.4 C) (Oral)   Resp 16   SpO2 97%   Visual Acuity Right Eye Distance:   Left Eye Distance:   Bilateral Distance:    Right Eye Near:   Left Eye Near:  Bilateral Near:     Physical Exam Constitutional:      Appearance: Normal appearance.  Eyes:     Extraocular Movements: Extraocular movements intact.  Cardiovascular:     Rate and Rhythm: Normal rate and regular rhythm.     Pulses: Normal pulses.     Heart sounds: Normal heart sounds.  Pulmonary:     Effort: Pulmonary effort is normal.     Breath sounds: Normal breath sounds.  Neurological:     Mental Status: He is alert and oriented to person, place, and time. Mental status is at baseline.      UC Treatments / Results  Labs (all labs ordered are listed, but only abnormal results are displayed) Labs Reviewed - No data to display  EKG   Radiology No results found.  Procedures Procedures (including critical care time)  Medications Ordered in UC Medications - No data to display  Initial Impression / Assessment and Plan / UC Course  I have reviewed the triage vital signs and the nursing notes.  Pertinent labs & imaging results that were available during my care of the patient were  reviewed by me and considered in my medical decision making (see chart for details).  Mild intermittent asthma with acute exacerbation  Vital signs are stable patient is in no signs of distress nontoxic-appearing, lungs are clear to auscultation and O2 saturation greater than 90%, low suspicion for pneumonia or bronchitis therefore imaging deferred, stable for outpatient management, albuterol and prednisone burst sent to pharmacy, may follow-up with urgent care if symptoms persist or worsen, work note given Final Clinical Impressions(s) / UC Diagnoses   Final diagnoses:  None   Discharge Instructions   None    ED Prescriptions   None    PDMP not reviewed this encounter.   Valinda Hoar, NP 09/08/22 1026

## 2022-09-07 NOTE — Discharge Instructions (Signed)
Today you are being treated for inflammation to your upper airways  Begin use of prednisone every morning with food for 5 days to help reduce inflammation   May use albuterol inhaler taking 2 puffs every 4 hours as needed to help calm shortness of breath and wheezing  In addition:  Maintaining adequate hydration may help to thin secretions and soothe the respiratory mucosa   Warm Liquids- Ingestion of warm liquids may have a soothing effect on the respiratory mucosa, increase the flow of nasal mucus, and loosen respiratory secretions, making them easier to remove  May try honey (2.5 to 5 mL [0.5 to 1 teaspoon]) can be given straight or diluted in liquid (juice). Corn syrup may be substituted if honey is not available.

## 2022-09-30 ENCOUNTER — Encounter (HOSPITAL_COMMUNITY): Payer: Self-pay

## 2022-09-30 ENCOUNTER — Ambulatory Visit (HOSPITAL_COMMUNITY)
Admission: EM | Admit: 2022-09-30 | Discharge: 2022-09-30 | Disposition: A | Discharge status: Home / Self Care | Payer: Self-pay | Attending: Emergency Medicine | Admitting: Emergency Medicine

## 2022-09-30 DIAGNOSIS — R350 Frequency of micturition: Secondary | ICD-10-CM

## 2022-09-30 DIAGNOSIS — R369 Urethral discharge, unspecified: Secondary | ICD-10-CM

## 2022-09-30 LAB — POCT URINALYSIS DIP (MANUAL ENTRY)
Bilirubin, UA: NEGATIVE
Glucose, UA: NEGATIVE mg/dL
Nitrite, UA: NEGATIVE
Protein Ur, POC: 30 mg/dL — AB
Spec Grav, UA: 1.02 (ref 1.010–1.025)
Urobilinogen, UA: >=8 E.U./dL — AB
pH, UA: 7 (ref 5.0–8.0)

## 2022-09-30 NOTE — ED Triage Notes (Addendum)
Patient reports cloudy urine with a foul smell, bilateral lower back pain, and pain in his scrotum during sex since June 2024.  Patient also reports a clear penile discharge since June 2024. Patient also states that his sperm is yellow and sticky.  Patient states he was tested for STD's in June and was negative.   Patiaent states he has been taking D Mannose 500 mg x 1-2 weeks.

## 2022-09-30 NOTE — Discharge Instructions (Signed)
We have screened you for sexually transmitted infections and are sending your urine off for culture.  Our staff will contact you if antibiotics are indicated. Please abstain from intercourse until all results have been received.   If you are unable to follow-up with urology please get established with a primary care provider for further evaluation.  Return to clinic for new or urgent symptoms.

## 2022-09-30 NOTE — ED Provider Notes (Addendum)
[facility]    CSN: [id number] Arrival date & time: 09/30/22  1649      History   Chief Complaint Chief Complaint  Patient presents with   Urinary Frequency    HPI Danny Mccoy is a 37 y.o. male.   Patient presents to clinic with complaints of foul-smelling cloudy urine, bilateral lower back pain and scrotal pain with ejaculation since June 2024.  He was seen at the emergency department for this and given IM Rocephin, Flagyl and doxycycline.  He was told not to complete the Flagyl and doxycycline due to negative cytology.  He has been having a clear penile discharge and when he ejaculates his sperm is yellow and sticky.  Denies any hematuria, fevers, nausea or vomiting.  No abdominal pain.  Reports he has seen a urologist in the past, but it is not feasible to return to them as he has a high bill.  The history is provided by the patient and medical records.  Urinary Frequency Pertinent negatives include no abdominal pain.    Past Medical History:  Diagnosis Date   HSV-1 infection     Patient Active Problem List   Diagnosis Date Noted   AKI (acute kidney injury) (HCC)    Syncope 11/06/2020   CONSTIPATION 06/30/2007    History reviewed. No pertinent surgical history.     Home Medications    Prior to Admission medications   Not on File    Family History Family History  Problem Relation Age of Onset   Healthy Mother    Healthy Father     Social History Social History   Tobacco Use   Smoking status: Never   Smokeless tobacco: Never   Tobacco comments:    black and mild 2x/week  Vaping Use   Vaping status: Never Used  Substance Use Topics   Alcohol use: Not Currently   Drug use: Never     Allergies   Patient has no known allergies.   Review of Systems Review of Systems  Gastrointestinal:  Negative for abdominal pain, nausea and vomiting.  Genitourinary:  Positive for flank pain, frequency, penile discharge, testicular  pain and urgency.     Physical Exam Triage Vital Signs ED Triage Vitals  Encounter Vitals Group     BP 09/30/22 1716 111/74     Systolic BP Percentile --      Diastolic BP Percentile --      Pulse Rate 09/30/22 1716 65     Resp 09/30/22 1716 16     Temp 09/30/22 1716 98.3 F (36.8 C)     Temp Source 09/30/22 1716 Oral     SpO2 09/30/22 1716 96 %     Weight --      Height --      Head Circumference --      Peak Flow --      Pain Score 09/30/22 1718 0     Pain Loc --      Pain Education --      Exclude from Growth Chart --    No data found.  Updated Vital Signs BP 111/74 (BP Location: Left Arm)   Pulse 65   Temp 98.3 F (36.8 C) (Oral)   Resp 16   SpO2 96%   Visual Acuity Right Eye Distance:   Left Eye Distance:   Bilateral Distance:    Right Eye Near:   Left Eye Near:    Bilateral Near:     Physical Exam Vitals and  nursing note reviewed.  Constitutional:      Appearance: Normal appearance.  HENT:     Head: Normocephalic and atraumatic.     Right Ear: External ear normal.     Left Ear: External ear normal.     Nose: Nose normal.     Mouth/Throat:     Mouth: Mucous membranes are moist.  Eyes:     Conjunctiva/sclera: Conjunctivae normal.  Cardiovascular:     Rate and Rhythm: Normal rate.  Pulmonary:     Effort: Pulmonary effort is normal. No respiratory distress.  Abdominal:     Tenderness: There is no right CVA tenderness or left CVA tenderness.  Musculoskeletal:        General: Normal range of motion.  Skin:    General: Skin is warm and dry.  Neurological:     General: No focal deficit present.     Mental Status: He is alert and oriented to person, place, and time.  Psychiatric:        Mood and Affect: Mood normal.        Behavior: Behavior normal. Behavior is cooperative.      UC Treatments / Results  Labs (all labs ordered are listed, but only abnormal results are displayed) Labs Reviewed  POCT URINALYSIS DIP (MANUAL ENTRY) - Abnormal;  Notable for the following components:      Result Value   Ketones, POC UA trace (5) (*)    Blood, UA trace-intact (*)    Protein Ur, POC =30 (*)    Urobilinogen, UA >=8.0 (*)    Leukocytes, UA Small (1+) (*)    All other components within normal limits  URINE CULTURE  CYTOLOGY, (ORAL, ANAL, URETHRAL) ANCILLARY ONLY    EKG   Radiology No results found.  Procedures Procedures (including critical care time)  Medications Ordered in UC Medications - No data to display  Initial Impression / Assessment and Plan / UC Course  I have reviewed the triage vital signs and the nursing notes.  Pertinent labs & imaging results that were available during my care of the patient were reviewed by me and considered in my medical decision making (see chart for details).  Vitals and triage reviewed, patient is hemodynamically stable.  Rescreened for STIs.  Urinalysis with trace ketones, trace red blood cells, protein, urobilinogen and small leukocytes, will send for culture.  Some concern over epididymitis, culture and cytology will dictate treatment.  Negative for CVA tenderness, without nausea, vomiting or fevers.  Low concern for pyelonephritis.  Plan of care, follow-up care and return precautions given, no questions at this time.     Final Clinical Impressions(s) / UC Diagnoses   Final diagnoses:  Increased urinary frequency  Penile discharge     Discharge Instructions      We have screened you for sexually transmitted infections and are sending your urine off for culture.  Our staff will contact you if antibiotics are indicated. Please abstain from intercourse until all results have been received.   If you are unable to follow-up with urology please get established with a primary care provider for further evaluation.  Return to clinic for new or urgent symptoms.     ED Prescriptions   None    PDMP not reviewed this encounter.       Buck Mcaffee, [place], [place] 09/30/22 1807

## 2022-09-30 NOTE — ED Notes (Signed)
PCP appointment made per pt preference

## 2022-10-01 LAB — CYTOLOGY, (ORAL, ANAL, URETHRAL) ANCILLARY ONLY
Chlamydia: NEGATIVE
Comment: NEGATIVE
Comment: NEGATIVE
Comment: NORMAL
Neisseria Gonorrhea: NEGATIVE
Trichomonas: NEGATIVE

## 2022-10-01 NOTE — Progress Notes (Deleted)
New Patient Office Visit  Subjective:   Danny Mccoy 26-Dec-1985 10/02/2022  No chief complaint on file.   HPI: Danny Mccoy presents today to establish care at [facility] at [facility]. Introduced to Publishing rights manager role and practice setting.  All questions answered.   Last PCP: *** Last annual physical: *** Concerns: See below    The following portions of the patient's history were reviewed and updated as appropriate: past medical history, past surgical history, family history, social history, allergies, medications, and problem list.   Patient Active Problem List   Diagnosis Date Noted   AKI (acute kidney injury) (HCC)    Syncope 11/06/2020   CONSTIPATION 06/30/2007   Past Medical History:  Diagnosis Date   HSV-1 infection    No past surgical history on file. Family History  Problem Relation Age of Onset   Healthy Mother    Healthy Father    Social History   Socioeconomic History   Marital status: Single    Spouse name: Not on file   Number of children: Not on file   Years of education: Not on file   Highest education level: Not on file  Occupational History   Not on file  Tobacco Use   Smoking status: Never   Smokeless tobacco: Never   Tobacco comments:    black and mild 2x/week  Vaping Use   Vaping status: Never Used  Substance and Sexual Activity   Alcohol use: Not Currently   Drug use: Never   Sexual activity: Not on file  Other Topics Concern   Not on file  Social History Narrative   Not on file   Social Determinants of Health   Financial Resource Strain: Not on file  Food Insecurity: Not on file  Transportation Needs: Not on file  Physical Activity: Not on file  Stress: Not on file  Social Connections: Not on file  Intimate Partner Violence: Not on file   No outpatient medications prior to visit.   No facility-administered medications prior to visit.   No Known  Allergies  ROS: A complete ROS was performed with pertinent positives/negatives noted in the HPI. The remainder of the ROS are negative.   Objective:   There were no vitals filed for this visit.  GENERAL: Well-appearing, in NAD. Well nourished.  SKIN: Pink, warm and dry. No rash, lesion, ulceration, or ecchymoses.  Head: Normocephalic. NECK: Trachea midline. Full ROM w/o pain or tenderness. No lymphadenopathy.  EARS: Tympanic membranes are intact, translucent without bulging and without drainage. Appropriate landmarks visualized.  EYES: Conjunctiva clear without exudates. EOMI, PERRL, no drainage present.  NOSE: Septum midline w/o deformity. Nares patent, mucosa pink and non-inflamed w/o drainage. No sinus tenderness.  THROAT: Uvula midline. Oropharynx clear. Tonsils non-inflamed without exudate. Mucous membranes pink and moist.  RESPIRATORY: Chest wall symmetrical. Respirations even and non-labored. Breath sounds clear to auscultation bilaterally.  CARDIAC: S1, S2 present, regular rate and rhythm without murmur or gallops. Peripheral pulses 2+ bilaterally.  MSK: Muscle tone and strength appropriate for age. Joints w/o tenderness, redness, or swelling.  EXTREMITIES: Without clubbing, cyanosis, or edema.  NEUROLOGIC: No motor or sensory deficits. Steady, even gait. C2-C12 intact.  PSYCH/MENTAL STATUS: Alert, oriented x 3. Cooperative, appropriate mood and affect.    Health Maintenance Due  Topic Date Due   Hepatitis C Screening  Never done   DTaP/Tdap/Td (1 - Tdap) Never done   COVID-19 Vaccine (1 - 2023-24 season) Never  done   INFLUENZA VACCINE  09/10/2022    No results found for any visits on 10/02/22.     Assessment & Plan:  There are no diagnoses linked to this encounter.    Patient to reach out to office if new, worrisome, or unresolved symptoms arise or if no improvement in patient's condition. Patient verbalized understanding and is agreeable to treatment plan. All  questions answered to patient's satisfaction.    No follow-ups on file.   Of note, portions of this note may have been created with voice recognition software Physicist, medical). While this note has been edited for accuracy, occasional wrong-word or 'sound-a-like' substitutions may have occurred due to the inherent limitations of voice recognition software.  Yolanda Manges, FNP

## 2022-10-02 ENCOUNTER — Ambulatory Visit (HOSPITAL_BASED_OUTPATIENT_CLINIC_OR_DEPARTMENT_OTHER): Payer: Self-pay | Admitting: Family Medicine

## 2022-10-02 ENCOUNTER — Ambulatory Visit (HOSPITAL_COMMUNITY)
Admission: EM | Admit: 2022-10-02 | Discharge: 2022-10-02 | Disposition: A | Discharge status: Home / Self Care | Payer: Self-pay | Attending: Physician Assistant | Admitting: Physician Assistant

## 2022-10-02 LAB — URINE CULTURE: Culture: 20000 — AB

## 2022-10-02 MED ORDER — NITROFURANTOIN MONOHYD MACRO 100 MG PO CAPS: 100.0000 mg | ORAL_CAPSULE | Freq: Two times a day (BID) | ORAL | 0 refills | Status: DC

## 2022-10-02 NOTE — ED Notes (Signed)
Pt came in today for antibiotics to be called in he doesn't need to be seen today.   Advised pt meds sent in and he needs to follow up with urology

## 2022-10-02 NOTE — ED Provider Notes (Signed)
Patient saw his urine culture results on MyChart presents to clinic requesting antibiotic.  Reports he continues to have symptoms.  He STI screening was negative.  Culture grew ESBL E. coli at approximately 20,000 colonies but this was sensitive to Macrobid.  Macrobid was sent to pharmacy twice daily for 5 days.  I did recommend he follow-up with urology and this information was relayed to him by nursing staff.  If he has any worsening symptoms he is to be seen immediately.   Jeani Hawking, PA-C 10/02/22 1740

## 2022-10-09 ENCOUNTER — Emergency Department (HOSPITAL_COMMUNITY)
Admission: EM | Admit: 2022-10-09 | Discharge: 2022-10-09 | Disposition: A | Discharge status: Home / Self Care | Payer: Self-pay | Attending: Emergency Medicine | Admitting: Emergency Medicine

## 2022-10-09 ENCOUNTER — Other Ambulatory Visit: Payer: Self-pay

## 2022-10-09 ENCOUNTER — Encounter (HOSPITAL_COMMUNITY): Payer: Self-pay | Admitting: Emergency Medicine

## 2022-10-09 DIAGNOSIS — R300 Dysuria: Secondary | ICD-10-CM | POA: Insufficient documentation

## 2022-10-09 DIAGNOSIS — R319 Hematuria, unspecified: Secondary | ICD-10-CM | POA: Insufficient documentation

## 2022-10-09 LAB — URINALYSIS, ROUTINE W REFLEX MICROSCOPIC
Bilirubin Urine: NEGATIVE
Glucose, UA: NEGATIVE mg/dL
Hgb urine dipstick: NEGATIVE
Ketones, ur: 5 mg/dL — AB
Nitrite: POSITIVE — AB
Protein, ur: 30 mg/dL — AB
RBC / HPF: >50 RBC/hpf (ref 0–5)
Specific Gravity, Urine: 1.028 (ref 1.005–1.030)
WBC, UA: >50 WBC/hpf (ref 0–5)
pH: 5 (ref 5.0–8.0)

## 2022-10-09 MED ORDER — FOSFOMYCIN TROMETHAMINE 3 G PO PACK
3.0000 g | PACK | Freq: Once | ORAL | Status: AC
  Administered 2022-10-09: 3 g via ORAL
  Filled 2022-10-09: qty 3

## 2022-10-09 NOTE — ED Notes (Signed)
Patient refused GC/Chlamydia swab.

## 2022-10-09 NOTE — Discharge Instructions (Addendum)
Return to ED for symptoms that include but are not limited to worsening of symptoms and fever >100.4 that is not resolved

## 2022-10-09 NOTE — ED Triage Notes (Signed)
Pt reports blood in urine with foul smell and burning with urination starting this am, pt denies unprotected sex

## 2022-10-09 NOTE — ED Provider Notes (Cosign Needed Addendum)
[facility] Provider Note   CSN: [id number] Arrival date & time: 10/09/22  0435     History  Chief Complaint  Patient presents with   Hematuria    Danny Mccoy is a 37 y.o. male with no past medical history presents to the emergency department for dysuria and hematuria.  He was seen on 8/21 at the [facility] urgent care center for foul-smelling cloudy urine, bilateral lower back pain, and scrotal pain with ejaculation.  Culture showed E. coli with sensitivity to Macrobid.  Patient reports that he took prescription as prescribed and temporarily felt better.  Today, he complains of dysuria and hematuria.  He denies pain with ejaculation, testicular pain, back pain, fever.   Hematuria      Home Medications Prior to Admission medications   Medication Sig Start Date End Date Taking? Authorizing Provider  nitrofurantoin, macrocrystal-monohydrate, (MACROBID) 100 MG capsule Take 1 capsule (100 mg total) by mouth 2 (two) times daily. 10/02/22   Raspet, Noberto Retort, PA-C      Allergies    Patient has no known allergies.    Review of Systems   Review of Systems  Constitutional:  Negative for chills, fatigue and fever.  Genitourinary:  Positive for dysuria and hematuria. Negative for difficulty urinating, flank pain, frequency, genital sores, penile discharge and testicular pain.  Neurological:  Negative for dizziness and weakness.    Physical Exam Updated Vital Signs BP 117/74   Pulse (!) 50   Temp 98.1 F (36.7 C) (Temporal)   Resp 16   Ht 6' (1.829 m)   Wt 90.7 kg   SpO2 99%   BMI 27.12 kg/m  Physical Exam Constitutional:      Appearance: Normal appearance. He is not ill-appearing.  Cardiovascular:     Rate and Rhythm: Normal rate and regular rhythm.     Pulses: Normal pulses.  Pulmonary:     Effort: Pulmonary effort is normal.     Breath sounds: Normal breath sounds.  Abdominal:     General: There is no distension.      Palpations: Abdomen is soft.     Tenderness: There is no abdominal tenderness. There is no right CVA tenderness, left CVA tenderness or guarding.  Skin:    General: Skin is warm.     Capillary Refill: Capillary refill takes less than 2 seconds.  Neurological:     Mental Status: He is alert and oriented to person, place, and time. Mental status is at baseline.     ED Results / Procedures / Treatments   Labs (all labs ordered are listed, but only abnormal results are displayed) Labs Reviewed  URINALYSIS, ROUTINE W REFLEX MICROSCOPIC - Abnormal; Notable for the following components:      Result Value   APPearance CLOUDY (*)    Ketones, ur 5 (*)    Protein, ur 30 (*)    Nitrite POSITIVE (*)    Leukocytes,Ua LARGE (*)    Bacteria, UA MANY (*)    All other components within normal limits  RPR  HIV ANTIBODY (ROUTINE TESTING W REFLEX)    EKG None  Radiology No results found.  Procedures Procedures    Medications Ordered in ED Medications  fosfomycin (MONUROL) packet 3 g (3 g Oral Given 10/09/22 4098)    ED Course/ Medical Decision Making/ A&P  Medical Decision Making Amount and/or Complexity of Data Reviewed Labs: ordered.  Risk Prescription drug management.   Danny Mccoy is a 37 year old male who presents to the emergency department for evaluation of dysuria and hematuria.  See HPI for further details.  Upon assessment, patient is sitting comfortably in bed.  He is not ill-appearing. Lungs sounds CTAB. No discharge nor blood noted from urethra. No testicular pain nor CVA tenderness.   Provider note from [facility] urgent care center, urine culture, and STD screening from 8/21 reviewed. Cytology from UC is negative for STD and patient refuses to have screening in ED. He denies recent intercourse or concern for STDs.  UA now significant for nitrates. Culture is sensitive for cefepime, Zosyn, and other IV antibiotics. Fosfomycin  ordered due to patient not wanting IV antibiotics at this time and would prefer a one time PO medication. Labwork deferred as patient has no systemic symptoms, is afebrile, has no tachycardia, and is immunocompetent. Patient's symptoms likely due to complicated UTI that was not fully resolved with Macrobid.    Patient does not currently have a PCP at this time due to lack of insurance.  [place] [facility] recommended for routine health maintenance. Strict return precautions to include but not limited to fever greater than 100.4 that is not resolved with Tylenol, worsening or no improvement of symptoms, and retractable nausea and vomiting.   Patient is stable for discharge. Treatment plan discharge discussed with patient who understands and is agreeable to plan. All questions answered.   ADDENDUM 10:31 AM Chaperone Danny Dawes RN) was present during genital exam    Final Clinical Impression(s) / ED Diagnoses Final diagnoses:  Hematuria, unspecified type  Dysuria    Rx / DC Orders ED Discharge Orders     None         Judithann Sheen, PA 10/09/22 1003    Judithann Sheen, PA 10/09/22 1032    Sloan Leiter, DO 10/10/22 [phone]

## 2022-10-09 NOTE — ED Notes (Addendum)
Pt refused labs.  

## 2022-10-15 ENCOUNTER — Encounter (HOSPITAL_BASED_OUTPATIENT_CLINIC_OR_DEPARTMENT_OTHER): Payer: Self-pay | Admitting: Emergency Medicine

## 2022-10-15 ENCOUNTER — Other Ambulatory Visit: Payer: Self-pay

## 2022-10-15 ENCOUNTER — Encounter (HOSPITAL_COMMUNITY): Payer: Self-pay | Admitting: Emergency Medicine

## 2022-10-15 ENCOUNTER — Emergency Department (HOSPITAL_BASED_OUTPATIENT_CLINIC_OR_DEPARTMENT_OTHER)
Admission: EM | Admit: 2022-10-15 | Discharge: 2022-10-15 | Disposition: A | Discharge status: Home / Self Care | Payer: Medicaid Other | Attending: Emergency Medicine | Admitting: Emergency Medicine

## 2022-10-15 ENCOUNTER — Inpatient Hospital Stay (HOSPITAL_COMMUNITY)
Admission: EM | Admit: 2022-10-15 | Discharge: 2022-10-24 | DRG: 690 | Disposition: A | Discharge status: Home / Self Care | Payer: Medicaid Other | Attending: Internal Medicine | Admitting: Internal Medicine

## 2022-10-15 DIAGNOSIS — N390 Urinary tract infection, site not specified: Principal | ICD-10-CM | POA: Diagnosis present

## 2022-10-15 DIAGNOSIS — Z6830 Body mass index (BMI) 30.0-30.9, adult: Secondary | ICD-10-CM | POA: Diagnosis not present

## 2022-10-15 DIAGNOSIS — Z5902 Unsheltered homelessness: Secondary | ICD-10-CM

## 2022-10-15 DIAGNOSIS — B9629 Other Escherichia coli [E. coli] as the cause of diseases classified elsewhere: Secondary | ICD-10-CM | POA: Diagnosis not present

## 2022-10-15 DIAGNOSIS — B9620 Unspecified Escherichia coli [E. coli] as the cause of diseases classified elsewhere: Secondary | ICD-10-CM | POA: Diagnosis present

## 2022-10-15 DIAGNOSIS — E669 Obesity, unspecified: Secondary | ICD-10-CM | POA: Diagnosis present

## 2022-10-15 DIAGNOSIS — I4510 Unspecified right bundle-branch block: Secondary | ICD-10-CM | POA: Diagnosis present

## 2022-10-15 DIAGNOSIS — Z1612 Extended spectrum beta lactamase (ESBL) resistance: Secondary | ICD-10-CM | POA: Diagnosis present

## 2022-10-15 DIAGNOSIS — Z87440 Personal history of urinary (tract) infections: Secondary | ICD-10-CM | POA: Diagnosis not present

## 2022-10-15 DIAGNOSIS — R319 Hematuria, unspecified: Secondary | ICD-10-CM | POA: Diagnosis present

## 2022-10-15 DIAGNOSIS — A499 Bacterial infection, unspecified: Principal | ICD-10-CM

## 2022-10-15 HISTORY — DX: Urinary tract infection, site not specified: N39.0

## 2022-10-15 LAB — CBC WITH DIFFERENTIAL/PLATELET
Abs Immature Granulocytes: 0.02 10*3/uL (ref 0.00–0.07)
Basophils Absolute: 0 10*3/uL (ref 0.0–0.1)
Basophils Relative: 1 %
Eosinophils Absolute: 0.3 10*3/uL (ref 0.0–0.5)
Eosinophils Relative: 4 %
HCT: 41.7 % (ref 39.0–52.0)
Hemoglobin: 14.1 g/dL (ref 13.0–17.0)
Immature Granulocytes: 0 %
Lymphocytes Relative: 34 %
Lymphs Abs: 2.4 10*3/uL (ref 0.7–4.0)
MCH: 31.1 pg (ref 26.0–34.0)
MCHC: 33.8 g/dL (ref 30.0–36.0)
MCV: 92.1 fL (ref 80.0–100.0)
Monocytes Absolute: 0.7 10*3/uL (ref 0.1–1.0)
Monocytes Relative: 9 %
Neutro Abs: 3.7 10*3/uL (ref 1.7–7.7)
Neutrophils Relative %: 52 %
Platelets: 244 10*3/uL (ref 150–400)
RBC: 4.53 MIL/uL (ref 4.22–5.81)
RDW: 13.6 % (ref 11.5–15.5)
WBC: 7.1 10*3/uL (ref 4.0–10.5)
nRBC: 0 % (ref 0.0–0.2)

## 2022-10-15 LAB — BASIC METABOLIC PANEL
Anion gap: 8 (ref 5–15)
BUN: 9 mg/dL (ref 6–20)
CO2: 27 mmol/L (ref 22–32)
Calcium: 8.2 mg/dL — ABNORMAL LOW (ref 8.9–10.3)
Chloride: 105 mmol/L (ref 98–111)
Creatinine, Ser: 1.11 mg/dL (ref 0.61–1.24)
GFR, Estimated: >60 mL/min (ref >60)
Glucose, Bld: 69 mg/dL — ABNORMAL LOW (ref 70–99)
Potassium: 3.7 mmol/L (ref 3.5–5.1)
Sodium: 140 mmol/L (ref 135–145)

## 2022-10-15 LAB — URINALYSIS, ROUTINE W REFLEX MICROSCOPIC
Bilirubin Urine: NEGATIVE
Glucose, UA: NEGATIVE mg/dL
Hgb urine dipstick: NEGATIVE
Ketones, ur: NEGATIVE mg/dL
Nitrite: POSITIVE — AB
Protein, ur: NEGATIVE mg/dL
Specific Gravity, Urine: 1.025 (ref 1.005–1.030)
pH: 6 (ref 5.0–8.0)

## 2022-10-15 MED ORDER — HEPARIN SODIUM (PORCINE) 5000 UNIT/ML IJ SOLN
5000.0000 [IU] | Freq: Three times a day (TID) | INTRAMUSCULAR | Status: DC
  Filled 2022-10-15 (×3): qty 1

## 2022-10-15 MED ORDER — ACETAMINOPHEN 325 MG PO TABS: 650.0000 mg | ORAL_TABLET | Freq: Four times a day (QID) | ORAL | Status: DC | PRN

## 2022-10-15 MED ORDER — ALBUTEROL SULFATE (2.5 MG/3ML) 0.083% IN NEBU: 2.5000 mg | INHALATION_SOLUTION | RESPIRATORY_TRACT | Status: DC | PRN

## 2022-10-15 MED ORDER — CEFTRIAXONE SODIUM 1 G IJ SOLR: 1.0000 g | Freq: Once | INTRAMUSCULAR | Status: DC

## 2022-10-15 MED ORDER — ACETAMINOPHEN 650 MG RE SUPP: 650.0000 mg | Freq: Four times a day (QID) | RECTAL | Status: DC | PRN

## 2022-10-15 MED ORDER — ONDANSETRON HCL 4 MG/2ML IJ SOLN: 4.0000 mg | Freq: Four times a day (QID) | INTRAMUSCULAR | Status: DC | PRN

## 2022-10-15 MED ORDER — ONDANSETRON HCL 4 MG PO TABS: 4.0000 mg | ORAL_TABLET | Freq: Four times a day (QID) | ORAL | Status: DC | PRN

## 2022-10-15 MED ORDER — SODIUM CHLORIDE 0.9 % IV SOLN
1.0000 g | Freq: Three times a day (TID) | INTRAVENOUS | Status: DC
  Administered 2022-10-15 – 2022-10-18 (×8): 1 g via INTRAVENOUS
  Filled 2022-10-15 (×10): qty 20

## 2022-10-15 NOTE — ED Triage Notes (Signed)
Pt arrived POV, caox4, ambulatory, NAD stating he was getting a physical for a job and was told to get further eval at the ED for abnormal ECG. Pt denies CP, SOB, N/V, dizziness, weakness, or any other complaints at present. Pt states he saw a cardiologist a few years ago after syncope and was supposed to wear a heart monitor but never did follow through with wearing the heart monitor as directed.

## 2022-10-15 NOTE — ED Notes (Signed)
Patient verbalizes understanding of discharge instructions. Opportunity for questioning and answers were provided. Patient discharged from ED.  °

## 2022-10-15 NOTE — H&P (Addendum)
History and Physical    Danny Mccoy ZHY:[id number] DOB: 1985-05-22 DOA: 10/15/2022  PCP: Pcp, No  Patient coming from: home  I have personally briefly reviewed patient's old medical records in [facility]  Chief Complaint: Urinary frequency / hx of recurrent UTI  HPI: Danny Mccoy is a 37 y.o. male with medical history significant for  persistent UTI symptoms since mid August . Patient was diagnosed with ESBL  and since then has had course of both macrobid and fosfomycin. However despite treatment patient noted persistent symptoms.  Patient case was discussed with ID who recommended admission for IV antibiotics. Patient notes frequency as well as intermittent fevers at home. He notes he also had flank pain at the onset of symptoms, however this has resolved. He notes no n/v/d / abdominal pain on ros.  Of note prior  to this er visit patient was seen in ED as he was sent in by pcp due to new RBBB for evaluation. Patient had cardiac evaluation and repeat EKG noting RBBB but as has no cardiac symptoms or history  patient was discharged back to pcp care.  ED Course:  Tmx 99, bp 117/83, hr 72, r 18, sat 97% on ra  UA: +bacteria, + nitrite,  wbc 21-50 Labs: Wbc 7.1/ hgb 14.1, plt 244 Na 140, 3.7 , cL 105, glu69, cr 1.1  Tx  Meropenem   Review of Systems: As per HPI otherwise 10 point review of systems negative.   Past Medical History:  Diagnosis Date   HSV-1 infection     History reviewed. No pertinent surgical history.   reports that he has never smoked. He has never used smokeless tobacco. He reports that he does not currently use alcohol. He reports that he does not use drugs.  No Known Allergies  Family History  Problem Relation Age of Onset   Healthy Mother    Healthy Father     Prior to Admission medications   Medication Sig Start Date End Date Taking? Authorizing Provider  nitrofurantoin, macrocrystal-monohydrate, (MACROBID) 100 MG capsule Take 1  capsule (100 mg total) by mouth 2 (two) times daily. Patient not taking: Reported on 10/15/2022 10/02/22   Jeani Hawking, PA-C    Physical Exam: Vitals:   10/15/22 1951 10/15/22 2332  BP: 135/78 120/77  Pulse: 77 (!) 59  Resp: 18 18  Temp: 99 F (37.2 C) 98.1 F (36.7 C)  TempSrc: Oral   SpO2: 100% 98%    Constitutional: NAD, calm, comfortable Vitals:   10/15/22 1951 10/15/22 2332  BP: 135/78 120/77  Pulse: 77 (!) 59  Resp: 18 18  Temp: 99 F (37.2 C) 98.1 F (36.7 C)  TempSrc: Oral   SpO2: 100% 98%   Eyes: PERRL, lids and conjunctivae normal ENMT: Mucous membranes are moist. Posterior pharynx clear of any exudate or lesions.Normal dentition.  Neck: normal, supple, no masses, no thyromegaly Respiratory: clear to auscultation bilaterally, no wheezing, no crackles. Normal respiratory effort. No accessory muscle use.  Cardiovascular: Regular rate and rhythm, no murmurs / rubs / gallops. No extremity edema. 2+ pedal pulses. Abdomen: no tenderness, no masses palpated. No hepatosplenomegaly. Bowel sounds positive.  Musculoskeletal: no clubbing / cyanosis. No joint deformity upper and lower extremities. Good ROM, no contractures. Normal muscle tone. Note dysgenesis of b/l thumb  Skin: no rashes, lesions, ulcers. No induration Neurologic: CN 2-12 grossly intact. Sensation intact,  Strength 5/5 in all 4.  Psychiatric: Normal judgment and insight. Alert and oriented x 3.  Normal mood.    Labs on Admission: I have personally reviewed following labs and imaging studies  CBC: Recent Labs  Lab 10/15/22 2159  WBC 7.1  NEUTROABS 3.7  HGB 14.1  HCT 41.7  MCV 92.1  PLT 244   Basic Metabolic Panel: Recent Labs  Lab 10/15/22 2159  NA 140  K 3.7  CL 105  CO2 27  GLUCOSE 69*  BUN 9  CREATININE 1.11  CALCIUM 8.2*   GFR: Estimated Creatinine Clearance: 105.8 mL/min (by C-G formula based on SCr of 1.11 mg/dL). Liver Function Tests: No results for input(s): "AST", "ALT",  "ALKPHOS", "BILITOT", "PROT", "ALBUMIN" in the last 168 hours. No results for input(s): "LIPASE", "AMYLASE" in the last 168 hours. No results for input(s): "AMMONIA" in the last 168 hours. Coagulation Profile: No results for input(s): "INR", "PROTIME" in the last 168 hours. Cardiac Enzymes: No results for input(s): "CKTOTAL", "CKMB", "CKMBINDEX", "TROPONINI" in the last 168 hours. BNP (last 3 results) No results for input(s): "PROBNP" in the last 8760 hours. HbA1C: No results for input(s): "HGBA1C" in the last 72 hours. CBG: No results for input(s): "GLUCAP" in the last 168 hours. Lipid Profile: No results for input(s): "CHOL", "HDL", "LDLCALC", "TRIG", "CHOLHDL", "LDLDIRECT" in the last 72 hours. Thyroid Function Tests: No results for input(s): "TSH", "T4TOTAL", "FREET4", "T3FREE", "THYROIDAB" in the last 72 hours. Anemia Panel: No results for input(s): "VITAMINB12", "FOLATE", "FERRITIN", "TIBC", "IRON", "RETICCTPCT" in the last 72 hours. Urine analysis:    Component Value Date/Time   COLORURINE YELLOW 10/15/2022 2000   APPEARANCEUR HAZY (A) 10/15/2022 2000   LABSPEC 1.025 10/15/2022 2000   PHURINE 6.0 10/15/2022 2000   GLUCOSEU NEGATIVE 10/15/2022 2000   HGBUR NEGATIVE 10/15/2022 2000   BILIRUBINUR NEGATIVE 10/15/2022 2000   BILIRUBINUR negative 09/30/2022 1737   KETONESUR NEGATIVE 10/15/2022 2000   PROTEINUR NEGATIVE 10/15/2022 2000   UROBILINOGEN >=8.0 (A) 09/30/2022 1737   UROBILINOGEN 0.2 01/02/2022 1815   NITRITE POSITIVE (A) 10/15/2022 2000   LEUKOCYTESUR SMALL (A) 10/15/2022 2000    Radiological Exams on Admission: No results found.  EKG: Independently reviewed. See above  Assessment/Plan  ESBL UTI  -failure out patient tx with macrobid/fosfomycin  - per ID rec admit for tx with meropenem - f/u on repeat culture  -Imaging of renal system due to prolonged nature of infection   EKG with RBBB -no further work up required at this time -patient w/o cardiac  symptoms    DVT prophylaxis: heparin Code Status: full/ as discussed per patient wishes in event of cardiac arrest  Family Communication: none at bedside  Disposition Plan: patient  expected to be admitted greater than 2 midnights  Consults called: n/a Admission status: med tele   Lurline Del MD [facility]   If 7PM-7AM, please contact night-coverage [email or web address] Password [facility]  10/15/2022, 11:46 PM

## 2022-10-15 NOTE — ED Provider Notes (Signed)
[place] EMERGENCY DEPARTMENT AT [facility] Provider Note   CSN: [id number] Arrival date & time: 10/15/22  1943     History  Chief Complaint  Patient presents with   Urinary Frequency    Danny Mccoy is a 37 y.o. male.  This is a 37 year old male comes to the emergency department today due to increased urinary frequency.  Patient was recently treated for UTI.  He was seen here today for abnormal EKG and discharge.  I reviewed the patient's urine culture.   Urinary Frequency       Home Medications Prior to Admission medications   Medication Sig Start Date End Date Taking? Authorizing Provider  nitrofurantoin, macrocrystal-monohydrate, (MACROBID) 100 MG capsule Take 1 capsule (100 mg total) by mouth 2 (two) times daily. 10/02/22   Raspet, Noberto Retort, PA-C      Allergies    Patient has no known allergies.    Review of Systems   Review of Systems  Genitourinary:  Positive for frequency.    Physical Exam Updated Vital Signs BP 135/78 (BP Location: Right Arm)   Pulse 77   Temp 99 F (37.2 C) (Oral)   Resp 18   SpO2 100%  Physical Exam Constitutional:      Appearance: He is not ill-appearing or toxic-appearing.  Cardiovascular:     Rate and Rhythm: Normal rate.  Neurological:     Mental Status: He is alert.     ED Results / Procedures / Treatments   Labs (all labs ordered are listed, but only abnormal results are displayed) Labs Reviewed  URINALYSIS, ROUTINE W REFLEX MICROSCOPIC - Abnormal; Notable for the following components:      Result Value   APPearance HAZY (*)    Nitrite POSITIVE (*)    Leukocytes,Ua SMALL (*)    Bacteria, UA MANY (*)    All other components within normal limits  BASIC METABOLIC PANEL  CBC WITH DIFFERENTIAL/PLATELET  GC/CHLAMYDIA PROBE AMP ([place]) NOT AT [facility]    EKG None  Radiology No results found.  Procedures Procedures    Medications Ordered in ED Medications  meropenem (MERREM) 1 g in  sodium chloride 0.9 % 100 mL IVPB (has no administration in time range)    ED Course/ Medical Decision Making/ A&P                                 Medical Decision Making This is a 37 year old male is here today for increased urinary frequency.  Plan-looking at the patient's urine culture, he has an ESBL.  This is why the patient's outpatient antibiotics have not been effective.  Patient has not have a clear reason for why he is getting a UTI, but this may just be a one-off that has been inadequately treated due to resistance.  I have placed an infectious disease consult.  Patient is not systemically ill, I do not believe this patient requires labs at this time.  No flank pain.  Reassessment-spoke with infectious disease who recommend admission with carbapenem treatment given that the patient has failed outpatient treatment.  Will admit.  Basic labs ordered.  Amount and/or Complexity of Data Reviewed Labs: ordered.  Risk Prescription drug management. Decision regarding hospitalization.           Final Clinical Impression(s) / ED Diagnoses Final diagnoses:  ESBL (extended spectrum beta-lactamase) producing bacteria infection    Rx / DC Orders ED Discharge Orders  None         Anders Simmonds T, DO 10/15/22 2142

## 2022-10-15 NOTE — Discharge Instructions (Addendum)
Your EKG today is unchanged from your prior EKGs.  You have what is called an incomplete right bundle branch block which at times can be normal.  When you had your heart echo 2 years ago the function of your heart and the way it looked was normal.  If you start having any palpitations or feel like your heart is skipping beats or feel like you are going to pass out with activity you should follow-up with a cardiologist.  However if you do not have any of the symptoms you do not have any chest pain or shortness of breath you do not need any follow-up as your EKG is normal for you.

## 2022-10-15 NOTE — ED Triage Notes (Signed)
Pt presents stating he has taken 2 anbx recently for UTI and was told to come back if frequency occurred . Pt states he has had increased frequency and chills/fever.

## 2022-10-15 NOTE — ED Provider Notes (Signed)
[place] EMERGENCY DEPARTMENT AT [facility] Provider Note   CSN: [id number] Arrival date & time: 10/15/22  1442     History  Chief Complaint  Patient presents with   Abnormal ECG    Danny Mccoy is a 37 y.o. male.  Patient is a 37 year old male with a past medical history of abnormal EKG who is presenting today after having of routine physical done and routine EKG that was abnormal which concerned his provider.  Patient denies any chest pain, shortness of breath, recent syncope, palpitations or racing heart.  2 weeks ago he did join a gym and is started working out again.  He is on the treadmill for 20 minutes and reports he does get tired with jogging but he figured that was because he was out of shape.  He does not feel like he is going to pass out or have any significant chest pain when working out.  He does get winded with running and when he slows down symptoms go away.  Patient was seen a few years ago for an episode of syncope and at that time an abnormal EKG was noted.  He had a full cardiology workup with echo as well as CTA of the chest all which was normal.  No evidence of hypertrophic cardiomyopathy.  He also had no evidence of PE or pericardial effusions.  Patient was supposed to wear a Zio patch but reports he never completed it but denies any issues at this time.  The history is provided by the patient.       Home Medications Prior to Admission medications   Medication Sig Start Date End Date Taking? Authorizing Provider  nitrofurantoin, macrocrystal-monohydrate, (MACROBID) 100 MG capsule Take 1 capsule (100 mg total) by mouth 2 (two) times daily. 10/02/22   Raspet, Noberto Retort, PA-C      Allergies    Patient has no known allergies.    Review of Systems   Review of Systems  Physical Exam Updated Vital Signs BP 117/83 (BP Location: Right Arm)   Pulse 72   Temp 97.7 F (36.5 C)   Resp 18   Ht 5\' 10"  (1.778 m)   Wt 95.7 kg   SpO2 97%   BMI 30.28  kg/m  Physical Exam Vitals and nursing note reviewed.  Constitutional:      General: He is not in acute distress.    Appearance: He is well-developed.  HENT:     Head: Normocephalic and atraumatic.  Eyes:     Conjunctiva/sclera: Conjunctivae normal.     Pupils: Pupils are equal, round, and reactive to light.  Cardiovascular:     Rate and Rhythm: Normal rate and regular rhythm.     Heart sounds: No murmur heard. Pulmonary:     Effort: Pulmonary effort is normal. No respiratory distress.     Breath sounds: Normal breath sounds. No wheezing or rales.  Abdominal:     Palpations: Abdomen is soft.  Musculoskeletal:        General: No tenderness. Normal range of motion.     Cervical back: Normal range of motion and neck supple.  Skin:    General: Skin is warm and dry.     Findings: No erythema or rash.  Neurological:     Mental Status: He is alert and oriented to person, place, and time.  Psychiatric:        Behavior: Behavior normal.     ED Results / Procedures / Treatments   Labs (all  labs ordered are listed, but only abnormal results are displayed) Labs Reviewed - No data to display  EKG EKG Interpretation Date/Time:  Thursday October 15 2022 14:52:41 EDT Ventricular Rate:  72 PR Interval:  190 QRS Duration:  100 QT Interval:  386 QTC Calculation: 422 R Axis:   -46  Text Interpretation: Normal sinus rhythm with sinus arrhythmia Possible Left atrial enlargement Incomplete right bundle branch block Left anterior fascicular block Minimal voltage criteria for LVH, may be normal variant ( Cornell product ) When compared with ECG of 06-Apr-2021 20:22, No significant change since last tracing Reconfirmed by Gwyneth Sprout ([id number]) on 10/15/2022 3:44:00 PM  Radiology No results found.  Procedures Procedures    Medications Ordered in ED Medications - No data to display  ED Course/ Medical Decision Making/ A&P                                 Medical Decision  Making Amount and/or Complexity of Data Reviewed ECG/medicine tests: ordered and independent interpretation performed. Decision-making details documented in ED Course.   Patient presenting today due to having a physical and getting an abnormal read on his EKG.  This is not unusual for the patient.  He has had multiple EKGs done over the year all which have been abnormal with varying degrees of right bundle branch block and T wave inversions.  Patient has no symptoms at this time and has had cardiology workup for this in the past.  He has had no recent syncope or other issues.  His syncope in the past was thought to be related to dehydration.  Patient is also denying any symptoms suggestive of dysrhythmia.  I independently interpreted patient's EKG today which does show an incomplete right bundle branch block but no significant change from prior EKGs.  Patient's blood pressure is normal at 117/83 with normal heart rate and oxygen saturation.  At this time there is no indication for further testing or cardiology follow-up.  Did discuss with the patient that if he starts having palpitations or skipped beats he can follow back up with cardiology and complete the Zio patch.        Final Clinical Impression(s) / ED Diagnoses Final diagnoses:  Incomplete right bundle branch block (RBBB)    Rx / DC Orders ED Discharge Orders     None         Gwyneth Sprout, MD 10/15/22 1606

## 2022-10-15 NOTE — H&P (Incomplete)
History and Physical    Danny Mccoy:[id number] DOB: 06-09-1985 DOA: 10/15/2022  PCP: Pcp, No  Patient coming from: home  I have personally briefly reviewed patient's old medical records in [facility]  Chief Complaint: Urinary frequency / hx of recurrent UTI  HPI: Danny Mccoy is a 37 y.o. male with medical history significant of  persistent UTI symptoms since mid August due ESBL course of both macrobid and fosfomycin.  Patient cause was discussed with ID who recommended admission for IV antibiotics. Patient notes frequency has well as intermittent fevers at home. He notes he also had flank pain at the onset of symptoms however this has resolved. He notes no n/v/d / abdominal pain on ros.  Of note prior this er visit patient was seen in ED as he was sent in by pcp due to new RBBB for evaluation. Patient has cardiac evaluation and repeat EKG noting RBBB but with no cardiac symptoms or history for discharged.   ED Course:  Tmx 99, bp 117/83, hr 72, r 18, sat 97% on ra  UA: +bacteria, + nitrite,  wbc 21-50 Labs: Wbc 7.1/ hgb 14.1, plt 244 Na 140, 3.7 , cL 105, glu69, cr 1.1  Tx  Meropenem   Review of Systems: As per HPI otherwise 10 point review of systems negative.   Past Medical History:  Diagnosis Date  . HSV-1 infection     History reviewed. No pertinent surgical history.   reports that he has never smoked. He has never used smokeless tobacco. He reports that he does not currently use alcohol. He reports that he does not use drugs.  No Known Allergies  Family History  Problem Relation Age of Onset  . Healthy Mother   . Healthy Father     Prior to Admission medications   Medication Sig Start Date End Date Taking? Authorizing Provider  nitrofurantoin, macrocrystal-monohydrate, (MACROBID) 100 MG capsule Take 1 capsule (100 mg total) by mouth 2 (two) times daily. Patient not taking: Reported on 10/15/2022 10/02/22   Jeani Hawking, PA-C     Physical Exam: Vitals:   10/15/22 1951 10/15/22 2332  BP: 135/78 120/77  Pulse: 77 (!) 59  Resp: 18 18  Temp: 99 F (37.2 C) 98.1 F (36.7 C)  TempSrc: Oral   SpO2: 100% 98%    Constitutional: NAD, calm, comfortable Vitals:   10/15/22 1951 10/15/22 2332  BP: 135/78 120/77  Pulse: 77 (!) 59  Resp: 18 18  Temp: 99 F (37.2 C) 98.1 F (36.7 C)  TempSrc: Oral   SpO2: 100% 98%   Eyes: PERRL, lids and conjunctivae normal ENMT: Mucous membranes are moist. Posterior pharynx clear of any exudate or lesions.Normal dentition.  Neck: normal, supple, no masses, no thyromegaly Respiratory: clear to auscultation bilaterally, no wheezing, no crackles. Normal respiratory effort. No accessory muscle use.  Cardiovascular: Regular rate and rhythm, no murmurs / rubs / gallops. No extremity edema. 2+ pedal pulses. No carotid bruits.  Abdomen: no tenderness, no masses palpated. No hepatosplenomegaly. Bowel sounds positive.  Musculoskeletal: no clubbing / cyanosis. No joint deformity upper and lower extremities. Good ROM, no contractures. Normal muscle tone.  Skin: no rashes, lesions, ulcers. No induration Neurologic: CN 2-12 grossly intact. Sensation intact, DTR normal. Strength 5/5 in all 4.  Psychiatric: Normal judgment and insight. Alert and oriented x 3. Normal mood.    Labs on Admission: I have personally reviewed following labs and imaging studies  CBC: Recent Labs  Lab 10/15/22 2159  WBC 7.1  NEUTROABS 3.7  HGB 14.1  HCT 41.7  MCV 92.1  PLT 244   Basic Metabolic Panel: Recent Labs  Lab 10/15/22 2159  NA 140  K 3.7  CL 105  CO2 27  GLUCOSE 69*  BUN 9  CREATININE 1.11  CALCIUM 8.2*   GFR: Estimated Creatinine Clearance: 105.8 mL/min (by C-G formula based on SCr of 1.11 mg/dL). Liver Function Tests: No results for input(s): "AST", "ALT", "ALKPHOS", "BILITOT", "PROT", "ALBUMIN" in the last 168 hours. No results for input(s): "LIPASE", "AMYLASE" in the last 168  hours. No results for input(s): "AMMONIA" in the last 168 hours. Coagulation Profile: No results for input(s): "INR", "PROTIME" in the last 168 hours. Cardiac Enzymes: No results for input(s): "CKTOTAL", "CKMB", "CKMBINDEX", "TROPONINI" in the last 168 hours. BNP (last 3 results) No results for input(s): "PROBNP" in the last 8760 hours. HbA1C: No results for input(s): "HGBA1C" in the last 72 hours. CBG: No results for input(s): "GLUCAP" in the last 168 hours. Lipid Profile: No results for input(s): "CHOL", "HDL", "LDLCALC", "TRIG", "CHOLHDL", "LDLDIRECT" in the last 72 hours. Thyroid Function Tests: No results for input(s): "TSH", "T4TOTAL", "FREET4", "T3FREE", "THYROIDAB" in the last 72 hours. Anemia Panel: No results for input(s): "VITAMINB12", "FOLATE", "FERRITIN", "TIBC", "IRON", "RETICCTPCT" in the last 72 hours. Urine analysis:    Component Value Date/Time   COLORURINE YELLOW 10/15/2022 2000   APPEARANCEUR HAZY (A) 10/15/2022 2000   LABSPEC 1.025 10/15/2022 2000   PHURINE 6.0 10/15/2022 2000   GLUCOSEU NEGATIVE 10/15/2022 2000   HGBUR NEGATIVE 10/15/2022 2000   BILIRUBINUR NEGATIVE 10/15/2022 2000   BILIRUBINUR negative 09/30/2022 1737   KETONESUR NEGATIVE 10/15/2022 2000   PROTEINUR NEGATIVE 10/15/2022 2000   UROBILINOGEN >=8.0 (A) 09/30/2022 1737   UROBILINOGEN 0.2 01/02/2022 1815   NITRITE POSITIVE (A) 10/15/2022 2000   LEUKOCYTESUR SMALL (A) 10/15/2022 2000    Radiological Exams on Admission: No results found.  EKG: Independently reviewed. See above  Assessment/Plan  ESBL UTI  -failure out patient tx with macrobid/fosfomycin  - per ID rec admit for tx with meropenem - f/u on repeat culture  -Imaging of renal system due to prolonged nature of infection   EKG with RBBB -no further work up required at this time -patient w/o cardiac symptoms    DVT prophylaxis: heparin Code Status: full/ as discussed per patient wishes in event of cardiac arrest  Family  Communication: none at bedside  Disposition Plan: patient  expected to be admitted greater than 2 midnights  Consults called:  Admission status: *** (inpatient / obs / tele / medical floor / SDU)   Lurline Del MD [facility] Pager [phone]  If 7PM-7AM, please contact night-coverage [email or web address] Password [facility]  10/15/2022, 11:46 PM

## 2022-10-16 ENCOUNTER — Encounter (HOSPITAL_COMMUNITY): Payer: Self-pay | Admitting: Internal Medicine

## 2022-10-16 ENCOUNTER — Inpatient Hospital Stay (HOSPITAL_COMMUNITY): Payer: Medicaid Other

## 2022-10-16 DIAGNOSIS — A499 Bacterial infection, unspecified: Principal | ICD-10-CM

## 2022-10-16 HISTORY — DX: Bacterial infection, unspecified: A49.9

## 2022-10-16 LAB — GLUCOSE, CAPILLARY
Glucose-Capillary: 104 mg/dL — ABNORMAL HIGH (ref 70–99)
Glucose-Capillary: 106 mg/dL — ABNORMAL HIGH (ref 70–99)
Glucose-Capillary: 78 mg/dL (ref 70–99)
Glucose-Capillary: 89 mg/dL (ref 70–99)
Glucose-Capillary: 93 mg/dL (ref 70–99)

## 2022-10-16 LAB — CBC
HCT: 41.2 % (ref 39.0–52.0)
Hemoglobin: 13.9 g/dL (ref 13.0–17.0)
MCH: 30.2 pg (ref 26.0–34.0)
MCHC: 33.7 g/dL (ref 30.0–36.0)
MCV: 89.4 fL (ref 80.0–100.0)
Platelets: 221 10*3/uL (ref 150–400)
RBC: 4.61 MIL/uL (ref 4.22–5.81)
RDW: 13.5 % (ref 11.5–15.5)
WBC: 5.8 10*3/uL (ref 4.0–10.5)
nRBC: 0 % (ref 0.0–0.2)

## 2022-10-16 LAB — COMPREHENSIVE METABOLIC PANEL
ALT: 23 U/L (ref 0–44)
AST: 17 U/L (ref 15–41)
Albumin: 3.5 g/dL (ref 3.5–5.0)
Alkaline Phosphatase: 58 U/L (ref 38–126)
Anion gap: 10 (ref 5–15)
BUN: 7 mg/dL (ref 6–20)
CO2: 25 mmol/L (ref 22–32)
Calcium: 8.5 mg/dL — ABNORMAL LOW (ref 8.9–10.3)
Chloride: 101 mmol/L (ref 98–111)
Creatinine, Ser: 1.01 mg/dL (ref 0.61–1.24)
GFR, Estimated: >60 mL/min (ref >60)
Glucose, Bld: 93 mg/dL (ref 70–99)
Potassium: 3.6 mmol/L (ref 3.5–5.1)
Sodium: 136 mmol/L (ref 135–145)
Total Bilirubin: 1.4 mg/dL — ABNORMAL HIGH (ref 0.3–1.2)
Total Protein: 6 g/dL — ABNORMAL LOW (ref 6.5–8.1)

## 2022-10-16 LAB — GC/CHLAMYDIA PROBE AMP (~~LOC~~) NOT AT ARMC
Chlamydia: NEGATIVE
Comment: NEGATIVE
Comment: NORMAL
Neisseria Gonorrhea: NEGATIVE

## 2022-10-16 LAB — HEMOGLOBIN A1C
Hgb A1c MFr Bld: 5.5 % (ref 4.8–5.6)
Mean Plasma Glucose: 111.15 mg/dL

## 2022-10-16 NOTE — TOC Initial Note (Signed)
Transition of Care [facility]) - Initial/Assessment Note    Patient Details  Name: Danny Mccoy MRN: [id number] Date of Birth: [birth year]  [facility]) CM/SW Contact:    Kermit Balo, RN Phone Number: 10/16/2022, 2:59 PM  Clinical Narrative:                 Pt is from home alone. He states he has no one to check on him at home.  Pt drives self.  Pt not taking any prescription medications.  No PcP and no health insurance. CM (with pt permission) was able to get pt an appt at [facility] Information on the AVS.  CM will also add [facility] pharmacy information.  Pt plans to drive self home at dc.   Expected Discharge Plan: Home/Self Care Barriers to Discharge: Continued Medical Work up   Patient Goals and CMS Choice            Expected Discharge Plan and Services   Discharge Planning Services: CM Consult   Living arrangements for the past 2 months: Single Family Home                                      Prior Living Arrangements/Services Living arrangements for the past 2 months: Single Family Home Lives with:: Self Patient language and need for interpreter reviewed:: Yes Do you feel safe going back to the place where you live?: Yes        Care giver support system in place?: No (comment)   Criminal Activity/Legal Involvement Pertinent to Current Situation/Hospitalization: No - Comment as needed  Activities of Daily Living      Permission Sought/Granted                  Emotional Assessment Appearance:: Appears stated age Attitude/Demeanor/Rapport: Guarded Affect (typically observed): Quiet Orientation: : Oriented to Self, Oriented to Place, Oriented to  Time   Psych Involvement: No (comment)  Admission diagnosis:  UTI (urinary tract infection) [N39.0] ESBL (extended spectrum beta-lactamase) producing bacteria infection [A49.9, Z16.12] Patient Active Problem List   Diagnosis Date Noted   ESBL (extended  spectrum beta-lactamase) producing bacteria infection 10/16/2022   UTI (urinary tract infection) 10/15/2022   AKI (acute kidney injury) (HCC)    Syncope 11/06/2020   CONSTIPATION 06/30/2007   PCP:  Pcp, No Pharmacy:   [id number] [place], [place] - [address] [place] [address] Phone: [phone] Fax: [phone]  [id number] - [place], [place] - [address] AT [facility] [address] [place] [address] Phone: [phone] Fax: [phone]     Social Determinants of Health (SDOH) Social History: SDOH Screenings   Tobacco Use: Low Risk  (10/15/2022)   SDOH Interventions:     Readmission Risk Interventions     No data to display

## 2022-10-16 NOTE — Progress Notes (Signed)
PROGRESS NOTE    Danny Mccoy  GNF:[id number] DOB: 02-22-1985 DOA: 10/15/2022 PCP: Oneita Hurt, No  Chief Complaint  Patient presents with   Urinary Frequency    Brief Narrative:   Danny Mccoy is [name] 37 y.o. male with medical history significant for  persistent UTI symptoms since mid August . Patient was diagnosed with ESBL  and since then has had course of both macrobid and fosfomycin. However despite treatment patient noted persistent symptoms.    He's been admitted for IV antibiotics.  Assessment & Plan:   Principal Problem:   UTI (urinary tract infection)  Recurrent UTI's  ESBL E. Coli UTI  Hematuria Culture from 09/30/22 with ESBL e. Coli, awaiting repeat culture Meropenem CT abd/pelvis is pending He'll need urology follow up outpatient +/- ID follow up with his history of frequent UTI's (unusual for Danny Mccoy male)  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%, follow outpatient No cardiac symptoms    DVT prophylaxis: heparin Code Status: full Family Communication: none Disposition:   Status is: Inpatient Remains inpatient appropriate because: need for continued inpatient care   Consultants:  none  Procedures:  none  Antimicrobials:  Anti-infectives (From admission, onward)    Start     Dose/Rate Route Frequency Ordered Stop   10/15/22 2200  meropenem (MERREM) 1 g in sodium chloride 0.9 % 100 mL IVPB        1 g 200 mL/hr over 30 Minutes Intravenous Every 8 hours 10/15/22 2141     10/15/22 2100  cefTRIAXone (ROCEPHIN) injection 1 g  Status:  Discontinued        1 g Intramuscular  Once 10/15/22 2057 10/15/22 2130       Subjective: No new complaints Notes hematuria  Objective: Vitals:   10/15/22 2357 10/16/22 0351 10/16/22 0441 10/16/22 0904  BP: 127/87  (!) 97/57 108/65  Pulse: (!) 59  63 (!) 54  Resp: 20  20 20   Temp: 97.6 F (36.4 C)  (!) 97.5 F (36.4 C) 98.1 F (36.7 C)  TempSrc: Oral  Oral   SpO2: 100%  100% 99%  Weight:  95.7 kg     Height:  5\' 10"  (1.778 m)      Intake/Output Summary (Last 24 hours) at 10/16/2022 0945 Last data filed at 10/16/2022 8469 Gross per 24 hour  Intake 100 ml  Output 0 ml  Net 100 ml   Filed Weights   10/16/22 0351  Weight: 95.7 kg    Examination:  General exam: Appears calm and comfortable  Respiratory system: unlabored Gastrointestinal system: Abdomen is nondistended, soft and nontender.  No CVA tenderness. Central nervous system: Alert and oriented. No focal neurological deficits. Extremities: no LEE    Data Reviewed: I have personally reviewed following labs and imaging studies  CBC: Recent Labs  Lab 10/15/22 2159 10/16/22 0725  WBC 7.1 5.8  NEUTROABS 3.7  --   HGB 14.1 13.9  HCT 41.7 41.2  MCV 92.1 89.4  PLT 244 221    Basic Metabolic Panel: Recent Labs  Lab 10/15/22 2159 10/16/22 0725  NA 140 136  K 3.7 3.6  CL 105 101  CO2 27 25  GLUCOSE 69* 93  BUN 9 7  CREATININE 1.11 1.01  CALCIUM 8.2* 8.5*    GFR: Estimated Creatinine Clearance: 116.3 mL/min (by C-G formula based on SCr of 1.01 mg/dL).  Liver Function Tests: Recent Labs  Lab 10/16/22 0725  AST 17  ALT 23  ALKPHOS 58  BILITOT 1.4*  PROT  6.0*  ALBUMIN 3.5    CBG: Recent Labs  Lab 10/15/22 2359 10/16/22 0747  GLUCAP 104* 106*     No results found for this or any previous visit (from the past 240 hour(s)).       Radiology Studies: No results found.      Scheduled Meds:  heparin  5,000 Units Subcutaneous Q8H   Continuous Infusions:  meropenem (MERREM) IV 1 g (10/16/22 0625)     LOS: 1 day    Time spent: over 30 min    Lacretia Nicks, MD [facility]   To contact the attending provider between 7A-7P or the covering provider during after hours 7P-7A, please log into the web site [email or web address] and access using universal [place] password for that web site. If you do not have the password, please call the hospital operator.  10/16/2022, 9:45 AM

## 2022-10-17 LAB — CBC WITH DIFFERENTIAL/PLATELET
Abs Immature Granulocytes: 0.02 10*3/uL (ref 0.00–0.07)
Basophils Absolute: 0 10*3/uL (ref 0.0–0.1)
Basophils Relative: 1 %
Eosinophils Absolute: 0.3 10*3/uL (ref 0.0–0.5)
Eosinophils Relative: 4 %
HCT: 42.6 % (ref 39.0–52.0)
Hemoglobin: 14.5 g/dL (ref 13.0–17.0)
Immature Granulocytes: 0 %
Lymphocytes Relative: 33 %
Lymphs Abs: 2 10*3/uL (ref 0.7–4.0)
MCH: 30.3 pg (ref 26.0–34.0)
MCHC: 34 g/dL (ref 30.0–36.0)
MCV: 89.1 fL (ref 80.0–100.0)
Monocytes Absolute: 0.5 10*3/uL (ref 0.1–1.0)
Monocytes Relative: 8 %
Neutro Abs: 3.3 10*3/uL (ref 1.7–7.7)
Neutrophils Relative %: 54 %
Platelets: 225 10*3/uL (ref 150–400)
RBC: 4.78 MIL/uL (ref 4.22–5.81)
RDW: 13.3 % (ref 11.5–15.5)
WBC: 6.1 10*3/uL (ref 4.0–10.5)
nRBC: 0 % (ref 0.0–0.2)

## 2022-10-17 LAB — COMPREHENSIVE METABOLIC PANEL
ALT: 21 U/L (ref 0–44)
AST: 17 U/L (ref 15–41)
Albumin: 3.6 g/dL (ref 3.5–5.0)
Alkaline Phosphatase: 69 U/L (ref 38–126)
Anion gap: 8 (ref 5–15)
BUN: 5 mg/dL — ABNORMAL LOW (ref 6–20)
CO2: 25 mmol/L (ref 22–32)
Calcium: 8.3 mg/dL — ABNORMAL LOW (ref 8.9–10.3)
Chloride: 105 mmol/L (ref 98–111)
Creatinine, Ser: 0.97 mg/dL (ref 0.61–1.24)
GFR, Estimated: >60 mL/min (ref >60)
Glucose, Bld: 104 mg/dL — ABNORMAL HIGH (ref 70–99)
Potassium: 3.6 mmol/L (ref 3.5–5.1)
Sodium: 138 mmol/L (ref 135–145)
Total Bilirubin: 1 mg/dL (ref 0.3–1.2)
Total Protein: 6.3 g/dL — ABNORMAL LOW (ref 6.5–8.1)

## 2022-10-17 LAB — MAGNESIUM: Magnesium: 2.1 mg/dL (ref 1.7–2.4)

## 2022-10-17 LAB — PHOSPHORUS: Phosphorus: 3 mg/dL (ref 2.5–4.6)

## 2022-10-17 NOTE — Progress Notes (Signed)
PROGRESS NOTE    Danny Mccoy  WUJ:[id number] DOB: 1985/11/11 DOA: 10/15/2022 PCP: Oneita Hurt, No  Chief Complaint  Patient presents with   Urinary Frequency    Brief Narrative:   Danny Mccoy is Danny Mccoy 37 y.o. male with medical history significant for  persistent UTI symptoms since mid August . Patient was diagnosed with ESBL  and since then has had course of both macrobid and fosfomycin. However despite treatment patient noted persistent symptoms.    He's been admitted for IV antibiotics.  Assessment & Plan:   Principal Problem:   UTI (urinary tract infection) Active Problems:   ESBL (extended spectrum beta-lactamase) producing bacteria infection  Recurrent UTI's  ESBL E. Coli UTI  Hematuria Culture from 09/30/22 with ESBL e. Coli, awaiting repeat culture Meropenem CT abd/pelvis is without acute findings He'll need urology follow up outpatient (can call for Danny Mccoy follow up appointment) I consulted ID -> unfortunately, based on my conversation with micro today, no urine to add culture onto 9/5 sample.  Not sure Danny Mccoy urine culture at this time with him on antibiotics will be particularly useful.  Appreciate recommendations.     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%, follow outpatient No cardiac symptoms    DVT prophylaxis: heparin Code Status: full Family Communication: none Disposition:   Status is: Inpatient Remains inpatient appropriate because: need for continued inpatient care   Consultants:  none  Procedures:  none  Antimicrobials:  Anti-infectives (From admission, onward)    Start     Dose/Rate Route Frequency Ordered Stop   10/15/22 2200  meropenem (MERREM) 1 g in sodium chloride 0.9 % 100 mL IVPB        1 g 200 mL/hr over 30 Minutes Intravenous Every 8 hours 10/15/22 2141     10/15/22 2100  cefTRIAXone (ROCEPHIN) injection 1 g  Status:  Discontinued        1 g Intramuscular  Once 10/15/22 2057 10/15/22 2130       Subjective: No  complaints  Objective: Vitals:   10/16/22 1725 10/16/22 2023 10/17/22 0551 10/17/22 0951  BP: (!) 117/58 (!) 128/92 118/81 112/78  Pulse: 60 76 (!) 56 61  Resp: 16 16 18    Temp: 98.5 F (36.9 C) 98.5 F (36.9 C) 97.7 F (36.5 C) 98 F (36.7 C)  TempSrc:  Oral Oral Oral  SpO2: 100% 100% 100% 100%  Weight:      Height:        Intake/Output Summary (Last 24 hours) at 10/17/2022 1627 Last data filed at 10/17/2022 0900 Gross per 24 hour  Intake 1030.76 ml  Output 0 ml  Net 1030.76 ml   Filed Weights   10/16/22 0351  Weight: 95.7 kg    Examination:  General: No acute distress. Cardiovascular: RRR Lungs: unlabored Neurological: Alert and oriented 3. Moves all extremities 4 with equal strength. Cranial nerves II through XII grossly intact. Extremities: No clubbing or cyanosis. No edema.   Data Reviewed: I have personally reviewed following labs and imaging studies  CBC: Recent Labs  Lab 10/15/22 2159 10/16/22 0725  WBC 7.1 5.8  NEUTROABS 3.7  --   HGB 14.1 13.9  HCT 41.7 41.2  MCV 92.1 89.4  PLT 244 221    Basic Metabolic Panel: Recent Labs  Lab 10/15/22 2159 10/16/22 0725  NA 140 136  K 3.7 3.6  CL 105 101  CO2 27 25  GLUCOSE 69* 93  BUN 9 7  CREATININE 1.11 1.01  CALCIUM 8.2* 8.5*  GFR: Estimated Creatinine Clearance: 116.3 mL/min (by C-G formula based on SCr of 1.01 mg/dL).  Liver Function Tests: Recent Labs  Lab 10/16/22 0725  AST 17  ALT 23  ALKPHOS 58  BILITOT 1.4*  PROT 6.0*  ALBUMIN 3.5    CBG: Recent Labs  Lab 10/15/22 2359 10/16/22 0747 10/16/22 1123 10/16/22 1624 10/16/22 2026  GLUCAP 104* 106* 89 78 93     No results found for this or any previous visit (from the past 240 hour(s)).       Radiology Studies: CT ABDOMEN PELVIS WO CONTRAST  Result Date: 10/16/2022 CLINICAL DATA:  Complicated urinary tract infection. Possible admission for persistent UTI symptoms since mid August. On antibiotics. EXAM: CT ABDOMEN  AND PELVIS WITHOUT CONTRAST TECHNIQUE: Multidetector CT imaging of the abdomen and pelvis was performed following the standard protocol without IV contrast. RADIATION DOSE REDUCTION: This exam was performed according to the departmental dose-optimization program which includes automated exposure control, adjustment of the mA and/or kV according to patient size and/or use of iterative reconstruction technique. COMPARISON:  None Available. FINDINGS: Lower chest: Mild dependent atelectasis at both lung bases. No significant pleural or pericardial effusion. Hepatobiliary: The liver appears unremarkable as imaged in the noncontrast state. No evidence of gallstones, gallbladder wall thickening or biliary dilatation. Pancreas: Unremarkable. No pancreatic ductal dilatation or surrounding inflammatory changes. Spleen: Normal in size without focal abnormality. Adrenals/Urinary Tract: Both adrenal glands appear normal. Both kidneys appear unremarkable as imaged in the noncontrast state. There is no evidence of urinary tract calculus, hydronephrosis or perinephric soft tissue stranding. The bladder appears normal for its degree of distention. Stomach/Bowel: No enteric contrast administered. The stomach appears unremarkable for its degree of distension. No evidence of bowel wall thickening, distention or surrounding inflammatory change. The appendix appears normal. Vascular/Lymphatic: There are no enlarged abdominal or pelvic lymph nodes. No significant vascular findings on noncontrast imaging. Reproductive: The prostate gland and seminal vesicles appear unremarkable. Other: No evidence of abdominal wall mass or hernia. No ascites or pneumoperitoneum. Musculoskeletal: No acute or significant osseous findings. IMPRESSION: No acute findings or explanation for the patient's symptoms. Specifically, no evidence of urinary tract calculus, hydronephrosis or complicated urinary tract infection. Electronically Signed   By: Danny Mccoy M.D.   On: 10/16/2022 12:13        Scheduled Meds:  heparin  5,000 Units Subcutaneous Q8H   Continuous Infusions:  meropenem (MERREM) IV 1 g (10/17/22 1410)     LOS: 2 days    Time spent: over 30 min    Lacretia Nicks, MD [facility]   To contact the attending provider between 7A-7P or the covering provider during after hours 7P-7A, please log into the web site [email or web address] and access using universal [place] password for that web site. If you do not have the password, please call the hospital operator.  10/17/2022, 4:27 PM

## 2022-10-17 NOTE — Progress Notes (Signed)
   10/16/22 2215  Provider Notification  Provider Name/Title Dr. Joneen Roach  Date Provider Notified 10/16/22  Time Provider Notified 2215  Method of Notification Page  Notification Reason Red med refusal (Heparin SQ)  Provider response No new orders   Patient refusing Heparin SQ.  Educated on medication.  Patient is ambulatory and frequently walks in his room and the hallway.  Dr. Joneen Roach made aware.  Bernie Covey RN

## 2022-10-17 NOTE — Consult Note (Signed)
[facility]                                                                                        Patient Identification: Patient Name: Danny Mccoy MRN: [id number] Admit Date: 10/15/2022  7:44 PM Today's Date: 10/17/2022 Reason for consult: Concern for recurrent UTI Requesting provider: Dr. Lowell Guitar  Principal Problem:   UTI (urinary tract infection) Active Problems:   ESBL (extended spectrum beta-lactamase) producing bacteria infection   Antibiotics:  Meropenem 9/5-c  Lines/Hardware:  Assessment # Complicated ESBL E coli UTI    Recommendations  Continue meropenem  Fu Urine cx  Plan for at least 7 days course  Follow up with Urology  Rest of the management as per the primary team. Please call with questions or concerns.  Thank you for the consult  __________________________________________________________________________________________________________ HPI and Hospital Course: 37 year old male with h/o RBBB who presented to the ED on 9/5 who presented to the ED for abnormal ECG as part of his physical examination for a job and was discharged after unremarkable history as well as workup.  Followed up later in the day due to increased urinary frequency as well as fevers and chills as he was told to come back to the ED if no improvement of UTI symptoms and antibiotics.    Was seen in the ED on 7/24 for penile discharge with a history of new sexual partner and possible exposure to STD Urine GC negative, RPR negative, HIV negative.  he was given a course of doxycycline.  Patient was seen in the ED 8/21 for UTI symptoms with foul-smelling cloudy urine, bilateral lower back pain, scrotal pain with ejaculation.  Urine GC and trichomonas was negative.  Urine culture grew 20,000 colonies of ESBL E. coli and was prescribed Macrobid for 5 days.  Reports he did not have any improvement in  his symptoms and also started having hematuria and presented to the ED on 8/30 with persistent dysuria and new hematuria.  He was given a dose of fosfomycin as he did not want IV antibiotics.  He reports improvement in hematuria with fosfomycin but continued to have increased frequency as well as dysuria came to the back to the ED on 9/5.  He reports GU symptoms have been intermittently going on since May 2024 and he was following a urologist and had also completed a course of ciprofloxacin in the past but was not able to follow-up because of lack of insurance and need to be out of caution many.  Reports she was also treated with a 4-week course of ciprofloxacin last year for prostatitis  Reports he has been sexually active with male but has not been since his symptoms of UTI started.  Denies any penile lesions or rashes or discharge.  Denies any nausea, vomiting abdominal pain or diarrhea.  Denies any chest pain, cough and shortness of breath.  Denies any joint pain.  Reports his urine is more clear as well as burning sensation has improved with IV antibiotics. Report he was tested for HIV as well as HBV and HCV serology which was unremarkable   At ED, afebrile  Labs remarkable for WBC 7.1, UA with positive nitrite, many bacteria and pyuria   ROS: as above  Past Medical History:  Diagnosis Date   HSV-1 infection    History reviewed. No pertinent surgical history.  Scheduled Meds:  heparin  5,000 Units Subcutaneous Q8H   Continuous Infusions:  meropenem (MERREM) IV 1 g (10/17/22 0556)   PRN Meds:.acetaminophen **OR** acetaminophen, albuterol, ondansetron **OR** ondansetron (ZOFRAN) IV  No Known Allergies  Social History   Socioeconomic History   Marital status: Single    Spouse name: Not on file   Number of children: Not on file   Years of education: Not on file   Highest education level: Not on file  Occupational History   Not on file  Tobacco Use   Smoking status: Never    Smokeless tobacco: Never   Tobacco comments:    black and mild 2x/week  Vaping Use   Vaping status: Never Used  Substance and Sexual Activity   Alcohol use: Not Currently   Drug use: Never   Sexual activity: Not on file  Other Topics Concern   Not on file  Social History Narrative   Not on file   Social Determinants of Health   Financial Resource Strain: Not on file  Food Insecurity: Not on file  Transportation Needs: Not on file  Physical Activity: Not on file  Stress: Not on file  Social Connections: Not on file  Intimate Partner Violence: Not on file   Family History  Problem Relation Age of Onset   Healthy Mother    Healthy Father     Vitals  BP 118/81 (BP Location: Left Arm)   Pulse (!) 56   Temp 97.7 F (36.5 C) (Oral)   Resp 18   Ht 5\' 10"  (1.778 m)   Wt 95.7 kg   SpO2 100%   BMI 30.27 kg/m   Physical Exam Constitutional: Adult male lying in the bed and appears comfortable    Comments: HEENT WNL  Cardiovascular:     Rate and Rhythm: Normal rate and regular rhythm.     Heart sounds: S1 and S2  Pulmonary:     Effort: Pulmonary effort is normal.     Comments: Normal lung sounds  Abdominal:     Palpations: Abdomen is soft.     Tenderness: Nondistended and nontender  Musculoskeletal:        General: No swelling or tenderness in peripheral joints  Skin:    Comments: No rashes.  GU exam deferred per patient request  Neurological:     General: Awake, alert and oriented, following commands  Psychiatric:        Mood and Affect: Mood normal.    Pertinent Microbiology Results for orders placed or performed during the hospital encounter of 09/30/22  Urine Culture     Status: Abnormal   Collection Time: 09/30/22  5:41 PM   Specimen: Urine, Clean Catch  Result Value Ref Range Status   Specimen Description URINE, CLEAN CATCH  Final   Special Requests   Final    NONE Performed at [facility], [address] [place], [place] [address]     Culture (A)  Final    20,000 COLONIES/mL ESCHERICHIA COLI Confirmed Extended Spectrum Beta-Lactamase Producer (ESBL).  In bloodstream infections from ESBL organisms, carbapenems are preferred over piperacillin/tazobactam. They are shown to have a lower risk of mortality.    Report Status 10/02/2022 FINAL  Final   Organism ID, Bacteria ESCHERICHIA COLI (A)  Final      Susceptibility   Escherichia coli - MIC*    AMPICILLIN >=32 RESISTANT Resistant     CEFAZOLIN >=64 RESISTANT Resistant     CEFEPIME 2 SENSITIVE Sensitive     CEFTRIAXONE >=64 RESISTANT Resistant     CIPROFLOXACIN >=4 RESISTANT Resistant     GENTAMICIN <=1 SENSITIVE Sensitive     IMIPENEM <=0.25 SENSITIVE Sensitive     NITROFURANTOIN <=16 SENSITIVE Sensitive     TRIMETH/SULFA >=320 RESISTANT Resistant     AMPICILLIN/SULBACTAM 4 SENSITIVE Sensitive     PIP/TAZO <=4 SENSITIVE Sensitive     * 20,000 COLONIES/mL ESCHERICHIA COLI    Pertinent Lab seen by me:    Latest Ref Rng & Units 10/16/2022    7:25 AM 10/15/2022    9:59 PM 04/06/2021    8:35 PM  CBC  WBC 4.0 - 10.5 K/uL 5.8  7.1  8.2   Hemoglobin 13.0 - 17.0 g/dL 16.1  09.6  04.5   Hematocrit 39.0 - 52.0 % 41.2  41.7  44.6   Platelets 150 - 400 K/uL 221  244  260       Latest Ref Rng & Units 10/16/2022    7:25 AM 10/15/2022    9:59 PM 04/06/2021    8:35 PM  CMP  Glucose 70 - 99 mg/dL 93  69  96   BUN 6 - 20 mg/dL 7  9  13    Creatinine 0.61 - 1.24 mg/dL 4.09  8.11  9.14   Sodium 135 - 145 mmol/L 136  140  139   Potassium 3.5 - 5.1 mmol/L 3.6  3.7  4.0   Chloride 98 - 111 mmol/L 101  105  104   CO2 22 - 32 mmol/L 25  27  28    Calcium 8.9 - 10.3 mg/dL 8.5  8.2  9.0   Total Protein 6.5 - 8.1 g/dL 6.0     Total Bilirubin 0.3 - 1.2 mg/dL 1.4     Alkaline Phos 38 - 126 U/L 58     AST 15 - 41 U/L 17     ALT 0 - 44 U/L 23        Pertinent Imagings/Other Imagings Plain films and CT images have been personally visualized and interpreted; radiology reports have been  reviewed. Decision making incorporated into the Impression / Recommendations.  CT ABDOMEN PELVIS WO CONTRAST  Result Date: 10/16/2022 CLINICAL DATA:  Complicated urinary tract infection. Possible admission for persistent UTI symptoms since mid August. On antibiotics. EXAM: CT ABDOMEN AND PELVIS WITHOUT CONTRAST TECHNIQUE: Multidetector CT imaging of the abdomen and pelvis was performed following the standard protocol without IV contrast. RADIATION DOSE REDUCTION: This exam was performed according to the departmental dose-optimization program which includes automated exposure control, adjustment of the mA and/or kV according to patient size and/or use of iterative reconstruction technique. COMPARISON:  None Available. FINDINGS: Lower chest: Mild dependent atelectasis at both lung bases. No significant pleural or pericardial effusion. Hepatobiliary: The liver appears unremarkable as imaged in the noncontrast state. No evidence of gallstones, gallbladder wall thickening or biliary dilatation. Pancreas: Unremarkable. No pancreatic ductal dilatation or surrounding inflammatory changes. Spleen: Normal in size without focal abnormality. Adrenals/Urinary Tract: Both adrenal glands appear normal. Both kidneys appear unremarkable as imaged in the noncontrast state. There is no evidence of urinary tract calculus, hydronephrosis or perinephric soft tissue stranding. The bladder appears normal for its degree of distention. Stomach/Bowel: No enteric contrast administered. The stomach appears unremarkable for its  degree of distension. No evidence of bowel wall thickening, distention or surrounding inflammatory change. The appendix appears normal. Vascular/Lymphatic: There are no enlarged abdominal or pelvic lymph nodes. No significant vascular findings on noncontrast imaging. Reproductive: The prostate gland and seminal vesicles appear unremarkable. Other: No evidence of abdominal wall mass or hernia. No ascites or  pneumoperitoneum. Musculoskeletal: No acute or significant osseous findings. IMPRESSION: No acute findings or explanation for the patient's symptoms. Specifically, no evidence of urinary tract calculus, hydronephrosis or complicated urinary tract infection. Electronically Signed   By: Carey Bullocks M.D.   On: 10/16/2022 12:13    I have personally spent 82  minutes involved in face-to-face and non-face-to-face activities for this patient on the day of the visit. Professional time spent includes the following activities: Preparing to see the patient (review of tests), Obtaining and/or reviewing separately obtained history (admission/discharge record), Performing a medically appropriate examination and/or evaluation , Ordering medications/tests/procedures, referring and communicating with other health care professionals, Documenting clinical information in the EMR, Independently interpreting results (not separately reported), Communicating results to the patient/family/caregiver, Counseling and educating the patient/family/caregiver and Care coordination (not separately reported).  Electronically signed by:   Plan d/w requesting provider as well as ID pharm D  Note: This document was prepared using dragon voice recognition software and may include unintentional dictation errors.   Odette Fraction, MD Infectious Disease Physician [facility] Pager: [phone]

## 2022-10-17 NOTE — Plan of Care (Signed)

## 2022-10-18 MED ORDER — SODIUM CHLORIDE 0.9 % IV SOLN
1.0000 g | INTRAVENOUS | Status: AC
  Administered 2022-10-18 – 2022-10-24 (×7): 1 g via INTRAVENOUS
  Filled 2022-10-18 (×7): qty 1000

## 2022-10-18 MED ORDER — SODIUM CHLORIDE 0.9% FLUSH: 10.0000 mL | INTRAVENOUS | Status: DC | PRN

## 2022-10-18 NOTE — TOC Progression Note (Addendum)
Transition of Care [facility]) - Progression Note    Patient Details  Name: Danny Mccoy MRN: [id number] Date of Birth: 01/10/1986  [facility] CM/SW Contact  Ronny Bacon, RN Phone Number: 10/18/2022, 1:42 PM  Clinical Narrative:   Message from provider that patient will need IV antibiotics at home. Reached out to [facility] at 1226, awaiting response.  1421: Response received from [facility]. Will send information to her team tomorrow and will meet with patient to discuss financials.    Expected Discharge Plan: Home/Self Care Barriers to Discharge: Continued Medical Work up  Expected Discharge Plan and Services   Discharge Planning Services: CM Consult   Living arrangements for the past 2 months: Single Family Home                                       Social Determinants of Health (SDOH) Interventions SDOH Screenings   Tobacco Use: Low Risk  (10/15/2022)    Readmission Risk Interventions     No data to display

## 2022-10-18 NOTE — Progress Notes (Signed)

## 2022-10-18 NOTE — Progress Notes (Addendum)
ID Brief note  Remains afebrile Urine culture was not collected on admission, unable to add culture to prior sample on 9/5.  Futile to get urine cultures now with being on abtx   Plan to treat as complicated UTI in a male  OK to place midline if planned for discharge  OPAT  Diagnosis: Complicated UTI in a male  Culture Result: ESBL E coli  No Known Allergies  OPAT Orders Discharge antibiotics to be given via PICC line Discharge antibiotics: ertapenem 1g iv daily  Per pharmacy protocol  Duration: 7 days  End Date: 10/22/22  [facility] Care Per Protocol:  Home health RN for IV administration and teaching; PICC line care and labs.    Labs weekly while on IV antibiotics: X__ CBC with differential X__ BMP __ CMP __ CRP __ ESR __ Vancomycin trough __ CK  X__ Please pull PIC at completion of IV antibiotics __ Please leave PIC in place until doctor has seen patient or been notified  Fax weekly labs to [phone]  Clinic Follow Up Appt: 9/18 at 3: 45 pm   ID will so, recall if needed   Odette Fraction, MD Infectious Disease Physician [facility] [address] [place], [place] [address] Phone: [phone]  Fax: [phone]

## 2022-10-18 NOTE — Progress Notes (Signed)
PHARMACY CONSULT NOTE FOR:  OUTPATIENT  PARENTERAL ANTIBIOTIC THERAPY (OPAT)  Indication: ESBL UTI Regimen: Ertapenem 1 gram IV every 24 hours End date: 10/22/22  IV antibiotic discharge orders are pended. To discharging provider:  please sign these orders via discharge navigator,  Select New Orders & click on the button choice - Manage This Unsigned Work.      Thank you for allowing pharmacy to be a part of this patient's care.   Signe Colt, PharmD 10/18/2022 11:15 AM  **Pharmacist phone directory can be found on [email or web address] listed under [facility]**

## 2022-10-18 NOTE — Plan of Care (Signed)

## 2022-10-18 NOTE — Progress Notes (Signed)
PROGRESS NOTE    Danny Mccoy  ZOX:[id number] DOB: October 30, 1985 DOA: 10/15/2022 PCP: Oneita Hurt, No  Chief Complaint  Patient presents with   Urinary Frequency    Brief Narrative:   Danny Mccoy is Danny Mccoy 37 y.o. male with medical history significant for  persistent UTI symptoms since mid August . Patient was diagnosed with ESBL  and since then has had course of both macrobid and fosfomycin. However despite treatment patient noted persistent symptoms.    He's been admitted for IV antibiotics.  Assessment & Plan:   Principal Problem:   UTI (urinary tract infection) Active Problems:   ESBL (extended spectrum beta-lactamase) producing bacteria infection  Recurrent UTI's  ESBL E. Coli UTI  Hematuria Culture from 09/30/22 with ESBL e. Coli, awaiting repeat culture Meropenem Unfortunately, unable to add on urine for urine culture CT abd/pelvis is without acute findings He'll need urology follow up outpatient (can call for Danny Mccoy follow up appointment) Appreciate ID recs, planning for d/c with midline and outpatient abx.  D/c home once home health antibiotics arranged.    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%, follow outpatient No cardiac symptoms    DVT prophylaxis: heparin Code Status: full Family Communication: none Disposition:   Status is: Inpatient Remains inpatient appropriate because: need for continued inpatient care   Consultants:  none  Procedures:  none  Antimicrobials:  Anti-infectives (From admission, onward)    Start     Dose/Rate Route Frequency Ordered Stop   10/18/22 1145  ertapenem (INVANZ) 1 g in sodium chloride 0.9 % 100 mL IVPB        1 g 200 mL/hr over 30 Minutes Intravenous Every 24 hours 10/18/22 1054 10/25/22 1144   10/15/22 2200  meropenem (MERREM) 1 g in sodium chloride 0.9 % 100 mL IVPB  Status:  Discontinued        1 g 200 mL/hr over 30 Minutes Intravenous Every 8 hours 10/15/22 2141 10/18/22 1054   10/15/22 2100  cefTRIAXone  (ROCEPHIN) injection 1 g  Status:  Discontinued        1 g Intramuscular  Once 10/15/22 2057 10/15/22 2130       Subjective: No complaints  Objective: Vitals:   10/17/22 1632 10/17/22 2120 10/18/22 0435 10/18/22 1021  BP: 130/85 109/70 114/72 (!) 131/96  Pulse: 79 65 (!) 51 67  Resp: 18 16 16    Temp: 98.2 F (36.8 C) 98.5 F (36.9 C) 97.8 F (36.6 C)   TempSrc:  Oral Oral   SpO2:  98% 98% 98%  Weight:      Height:        Intake/Output Summary (Last 24 hours) at 10/18/2022 1358 Last data filed at 10/18/2022 1147 Gross per 24 hour  Intake 2089.17 ml  Output 0 ml  Net 2089.17 ml   Filed Weights   10/16/22 0351  Weight: 95.7 kg    Examination:  General: No acute distress. Lungs: unlabored Neurological: Alert and oriented 3. Moves all extremities 4 with equal strength. Cranial nerves II through XII grossly intact. Extremities: No clubbing or cyanosis. No edema. Data Reviewed: I have personally reviewed following labs and imaging studies  CBC: Recent Labs  Lab 10/15/22 2159 10/16/22 0725 10/17/22 1616  WBC 7.1 5.8 6.1  NEUTROABS 3.7  --  3.3  HGB 14.1 13.9 14.5  HCT 41.7 41.2 42.6  MCV 92.1 89.4 89.1  PLT 244 221 225    Basic Metabolic Panel: Recent Labs  Lab 10/15/22 2159 10/16/22 0725 10/17/22 1616  NA 140 136 138  K 3.7 3.6 3.6  CL 105 101 105  CO2 27 25 25   GLUCOSE 69* 93 104*  BUN 9 7 5*  CREATININE 1.11 1.01 0.97  CALCIUM 8.2* 8.5* 8.3*  MG  --   --  2.1  PHOS  --   --  3.0    GFR: Estimated Creatinine Clearance: 121.1 mL/min (by C-G formula based on SCr of 0.97 mg/dL).  Liver Function Tests: Recent Labs  Lab 10/16/22 0725 10/17/22 1616  AST 17 17  ALT 23 21  ALKPHOS 58 69  BILITOT 1.4* 1.0  PROT 6.0* 6.3*  ALBUMIN 3.5 3.6    CBG: Recent Labs  Lab 10/15/22 2359 10/16/22 0747 10/16/22 1123 10/16/22 1624 10/16/22 2026  GLUCAP 104* 106* 89 78 93     No results found for this or any previous visit (from the past 240  hour(s)).       Radiology Studies: No results found.      Scheduled Meds:   Continuous Infusions:  ertapenem 1 g (10/18/22 1147)     LOS: 3 days    Time spent: over 30 min    Lacretia Nicks, MD [facility]   To contact the attending provider between 7A-7P or the covering provider during after hours 7P-7A, please log into the web site [email or web address] and access using universal [place] password for that web site. If you do not have the password, please call the hospital operator.  10/18/2022, 1:58 PM

## 2022-10-19 NOTE — Progress Notes (Signed)
PROGRESS NOTE    Danny Mccoy  XBJ:[id number] DOB: Dec 15, 1985 DOA: 10/15/2022 PCP: Oneita Hurt, No  Chief Complaint  Patient presents with   Urinary Frequency    Brief Narrative:   Danny Mccoy is Danny Mccoy 37 y.o. male with medical history significant for  persistent UTI symptoms since mid August . Patient was diagnosed with ESBL  and since then has had course of both macrobid and fosfomycin. However despite treatment patient noted persistent symptoms.    He's been admitted for IV antibiotics.  Discharge after he's completed 7 days antibiotics,   Assessment & Plan:   Principal Problem:   UTI (urinary tract infection) Active Problems:   ESBL (extended spectrum beta-lactamase) producing bacteria infection  Recurrent UTI's  ESBL E. Coli UTI  Hematuria Culture from 09/30/22 with ESBL e. Coli, awaiting repeat culture Meropenem, plan for 7 days Unfortunately, unable to add on urine for urine culture CT abd/pelvis is without acute findings He'll need urology follow up outpatient (can call for Danny Mccoy follow up appointment) Appreciate ID recs (9/8 note -> has follow up appt 9/18 at 3:45 PM).  Had planned for home with abx, but as he's homeless, he'll stay here to complete course.  Has midline in place.    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  Concerning sleeping in car.  He's not interested in information regarding shelters at this point, but will see if TOC has any resources that might be helpful for him.  Obesity Body mass index is 30.27 kg/m.     DVT prophylaxis: heparin Code Status: full Family Communication: none Disposition:   Status is: Inpatient Remains inpatient appropriate because: need for continued inpatient care   Consultants:  none  Procedures:  none  Antimicrobials:   Anti-infectives (From admission, onward)    Start     Dose/Rate Route Frequency Ordered Stop   10/18/22 1145  ertapenem (INVANZ) 1 g in sodium chloride 0.9 % 100 mL IVPB        1 g 200 mL/hr over 30 Minutes Intravenous Every 24 hours 10/18/22 1054 10/25/22 1144   10/15/22 2200  meropenem (MERREM) 1 g in sodium chloride 0.9 % 100 mL IVPB  Status:  Discontinued        1 g 200 mL/hr over 30 Minutes Intravenous Every 8 hours 10/15/22 2141 10/18/22 1054   10/15/22 2100  cefTRIAXone (ROCEPHIN) injection 1 g  Status:  Discontinued        1 g Intramuscular  Once 10/15/22 2057 10/15/22 2130       Subjective: No complaints  Objective: Vitals:   10/18/22 1649 10/18/22 2248 10/19/22 0513 10/19/22 1005  BP: 123/64 110/70 110/67 114/73  Pulse: (!) 59 65 67 61  Resp: 18 16 16 18   Temp: 98.4 F (36.9 C) 98.3 F (36.8 C) 98.1 F (36.7 C) 98.2 F (36.8 C)  TempSrc: Oral Oral Oral Oral  SpO2: 100% 100% 99% 100%  Weight:      Height:        Intake/Output Summary (Last 24 hours) at 10/19/2022 1054 Last data filed at 10/19/2022 0600 Gross per 24 hour  Intake 1540 ml  Output 0 ml  Net 1540 ml   Filed Weights   10/16/22 0351  Weight: 95.7 kg    Examination:  General: No acute distress. Lungs: unlabored Neurological: Alert  and oriented 3. Moves all extremities 4 with equal strength. Cranial nerves II through XII grossly intact. Extremities: No clubbing or cyanosis. No edema.  Data Reviewed: I have personally reviewed following labs and imaging studies  CBC: Recent Labs  Lab 10/15/22 2159 10/16/22 0725 10/17/22 1616  WBC 7.1 5.8 6.1  NEUTROABS 3.7  --  3.3  HGB 14.1 13.9 14.5  HCT 41.7 41.2 42.6  MCV 92.1 89.4 89.1  PLT 244 221 225    Basic Metabolic Panel: Recent Labs  Lab 10/15/22 2159 10/16/22 0725 10/17/22 1616  NA 140 136 138  K 3.7 3.6 3.6  CL 105 101 105  CO2 27 25 25   GLUCOSE 69* 93 104*  BUN 9 7 5*  CREATININE 1.11 1.01 0.97  CALCIUM 8.2* 8.5* 8.3*   MG  --   --  2.1  PHOS  --   --  3.0    GFR: Estimated Creatinine Clearance: 121.1 mL/min (by C-G formula based on SCr of 0.97 mg/dL).  Liver Function Tests: Recent Labs  Lab 10/16/22 0725 10/17/22 1616  AST 17 17  ALT 23 21  ALKPHOS 58 69  BILITOT 1.4* 1.0  PROT 6.0* 6.3*  ALBUMIN 3.5 3.6    CBG: Recent Labs  Lab 10/15/22 2359 10/16/22 0747 10/16/22 1123 10/16/22 1624 10/16/22 2026  GLUCAP 104* 106* 89 78 93     No results found for this or any previous visit (from the past 240 hour(s)).       Radiology Studies: No results found.      Scheduled Meds:   Continuous Infusions:  ertapenem 1 g (10/18/22 1147)     LOS: 4 days    Time spent: over 30 min    Danny Nicks, MD [facility]   To contact the attending provider between 7A-7P or the covering provider during after hours 7P-7A, please log into the web site [email or web address] and access using universal [place] password for that web site. If you do not have the password, please call the hospital operator.  10/19/2022, 10:54 AM

## 2022-10-19 NOTE — TOC CM/SW Note (Signed)
Transition of Care [facility]) - Inpatient Brief Assessment   Patient Details  Name: Danny Mccoy MRN: [id number] Date of Birth: January 04, 1986  [facility] CM/SW Contact:    Tom-Johnson, Hershal Coria, RN Phone Number: 10/19/2022, 11:13 AM   Clinical Narrative:  Patient presented to the ED with abnormal EKG results from a scheduled Physical Assessment at a potential new Employment.  Admitted with UTI, on IV abx. Patient will need 7 more days if IV abx before discharge. Has Hx of recurring UTI's.   Patient states he is currently homeless, living in his car and taking shower at a Gym he is subscribed to. Has a potential Employment he is working on getting and will get him a decent home afterwards. Has a daughter, does not have other immediate family.  Does not have Medical Insurance nor PCP.  Patient has hosp f/u appointment scheduled at [facility], info on AVS.   Patient to receive IV abx inpatient d/t homelessness.    CM will continue to follow as patient progresses with care towards discharge.                   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

## 2022-10-20 DIAGNOSIS — B9629 Other Escherichia coli [E. coli] as the cause of diseases classified elsewhere: Secondary | ICD-10-CM

## 2022-10-20 NOTE — Plan of Care (Signed)
  Problem: Clinical Measurements: Goal: Respiratory complications will improve Outcome: Progressing   Problem: Activity: Goal: Risk for activity intolerance will decrease Outcome: Progressing   Problem: Nutrition: Goal: Adequate nutrition will be maintained Outcome: Progressing   Problem: Coping: Goal: Level of anxiety will decrease Outcome: Progressing   Problem: Elimination: Goal: Will not experience complications related to bowel motility Outcome: Progressing   Problem: Safety: Goal: Ability to remain free from injury will improve Outcome: Progressing   Problem: Skin Integrity: Goal: Risk for impaired skin integrity will decrease Outcome: Progressing

## 2022-10-20 NOTE — Plan of Care (Signed)

## 2022-10-20 NOTE — Progress Notes (Signed)
PROGRESS NOTE    Danny Mccoy  HQI:[id number] DOB: 1985/11/14 DOA: 10/15/2022 PCP: Oneita Hurt, No    Brief Narrative:   Danny Mccoy is a 37 y.o. male with past medical history significant for persistent UTI symptoms since August 2024, recently diagnosed with ESBL UTI in which she has had a course of Macrobid and fosfomycin.  Despite treatment, patient reported persistent symptoms such as malodor, increased urinary frequency, intermittent fevers and flank pain.  Denies nausea/vomiting/diarrhea, no abdominal pain.  In the ED, temperature 97.7 F, HR 72, RR 18, BP 117/83, SpO2 97% on room air.  WBC 7.1, hemoglobin 14.1, platelet count 244.  Sodium 140, potassium 3.7, chloride 105, CO2 27, glucose 69, BUN 9, creatinine 1.11.  Urinalysis with small leukocytes, positive nitrite, many bacteria, 21-50 WBCs.  CT abdomen/pelvis with no acute findings.  Chlamydia/gonorrhea negative.  Patient was started on meropenem.  TRH consulted for admission for further evaluation management of ESBL UTI.  Assessment & Plan:   ESBL E. coli UTI Patient representing to the hospital with persistent UTI symptoms such as malodor, increased urinary frequency, intermittent fevers and flank pain.  CT abdomen/pelvis negative.  Urinalysis on admission with small leukocytes, positive nitrite, many bacteria, 21-50 WBCs; but unfortunate unable to add on culture.  Urine culture from 09/30/2022 with 20K ESBL E. Coli.  Patient was seen by infectious disease, changed to ertapenem.  Given homelessness, will need to stay inpatient to complete antibiotic course. -- Ertapenem 1 g IV every 24 hours, will plan 10-day course -- Has follow-up appointment with infectious disease on 10/28/2022 at 3:45 PM -- Will need patient follow-up with urology -- Contact precautions  Incomplete right bundle branch block Denies any cardiac symptoms.  TTE 2022 with LVEF 60 to 65%.  Continue outpatient follow-up.  Homelessness Currently sleeping in  his car.  Left previous living situation due to safety concerns.  Not interested in shelters. -- TOC following  Obesity Body mass index is 29.99 kg/m.   DVT prophylaxis: Ambulation    Code Status: Full Code Family Communication: No family present at bedside this morning  Disposition Plan:  Level of care: Med-Surg Status is: Inpatient Remains inpatient appropriate because: IV antibiotics    Consultants:  Infectious disease  Procedures:  None  Antimicrobials:  Meropenem 9/5 - 9/7 Ertapenem 9/8>>   Subjective: Patient seen examined bedside, resting company.  Lying in bed.  No specific complaints this morning.  Remains on IV antibiotics.  RN present at bedside.  Denies headache, no dizziness, no chest pain, no palpitations, no shortness of breath, no abdominal pain, no fever/chills/night sweats, no nausea/vomiting/diarrhea, no focal weakness, no fatigue, no paresthesias.  No acute events overnight per nursing staff.  Objective: Vitals:   10/19/22 1619 10/19/22 2108 10/20/22 0449 10/20/22 0929  BP: (!) 129/93 112/70 119/65 113/84  Pulse: 79 61 62 (!) 59  Resp: 18 13  18   Temp: 98.5 F (36.9 C) 98.4 F (36.9 C) 98.1 F (36.7 C) 98.2 F (36.8 C)  TempSrc: Oral Oral    SpO2: 100% 100% 100% 99%  Weight:   94.8 kg   Height:        Intake/Output Summary (Last 24 hours) at 10/20/2022 1355 Last data filed at 10/20/2022 0833 Gross per 24 hour  Intake 580 ml  Output 0 ml  Net 580 ml   Filed Weights   10/16/22 0351 10/20/22 0449  Weight: 95.7 kg 94.8 kg    Examination:  Physical Exam: GEN: NAD, alert and oriented  x 3, wd/wn HEENT: NCAT, PERRL, EOMI, sclera clear, MMM PULM: CTAB w/o wheezes/crackles, normal respiratory effort, on room air CV: RRR w/o M/G/R GI: abd soft, NTND, NABS, no R/G/M MSK: no peripheral edema, muscle strength globally intact 5/5 bilateral upper/lower extremities NEURO: CN II-XII intact, no focal deficits, sensation to light touch  intact PSYCH: normal mood/affect Integumentary: dry/intact, no rashes or wounds    Data Reviewed: I have personally reviewed following labs and imaging studies  CBC: Recent Labs  Lab 10/15/22 2159 10/16/22 0725 10/17/22 1616  WBC 7.1 5.8 6.1  NEUTROABS 3.7  --  3.3  HGB 14.1 13.9 14.5  HCT 41.7 41.2 42.6  MCV 92.1 89.4 89.1  PLT 244 221 225   Basic Metabolic Panel: Recent Labs  Lab 10/15/22 2159 10/16/22 0725 10/17/22 1616  NA 140 136 138  K 3.7 3.6 3.6  CL 105 101 105  CO2 27 25 25   GLUCOSE 69* 93 104*  BUN 9 7 5*  CREATININE 1.11 1.01 0.97  CALCIUM 8.2* 8.5* 8.3*  MG  --   --  2.1  PHOS  --   --  3.0   GFR: Estimated Creatinine Clearance: 120.5 mL/min (by C-G formula based on SCr of 0.97 mg/dL). Liver Function Tests: Recent Labs  Lab 10/16/22 0725 10/17/22 1616  AST 17 17  ALT 23 21  ALKPHOS 58 69  BILITOT 1.4* 1.0  PROT 6.0* 6.3*  ALBUMIN 3.5 3.6   No results for input(s): "LIPASE", "AMYLASE" in the last 168 hours. No results for input(s): "AMMONIA" in the last 168 hours. Coagulation Profile: No results for input(s): "INR", "PROTIME" in the last 168 hours. Cardiac Enzymes: No results for input(s): "CKTOTAL", "CKMB", "CKMBINDEX", "TROPONINI" in the last 168 hours. BNP (last 3 results) No results for input(s): "PROBNP" in the last 8760 hours. HbA1C: No results for input(s): "HGBA1C" in the last 72 hours. CBG: Recent Labs  Lab 10/15/22 2359 10/16/22 0747 10/16/22 1123 10/16/22 1624 10/16/22 2026  GLUCAP 104* 106* 89 78 93   Lipid Profile: No results for input(s): "CHOL", "HDL", "LDLCALC", "TRIG", "CHOLHDL", "LDLDIRECT" in the last 72 hours. Thyroid Function Tests: No results for input(s): "TSH", "T4TOTAL", "FREET4", "T3FREE", "THYROIDAB" in the last 72 hours. Anemia Panel: No results for input(s): "VITAMINB12", "FOLATE", "FERRITIN", "TIBC", "IRON", "RETICCTPCT" in the last 72 hours. Sepsis Labs: No results for input(s): "PROCALCITON",  "LATICACIDVEN" in the last 168 hours.  No results found for this or any previous visit (from the past 240 hour(s)).       Radiology Studies: No results found.      Scheduled Meds: Continuous Infusions:  ertapenem 1 g (10/20/22 0911)     LOS: 5 days    Time spent: 53 minutes spent on chart review, discussion with nursing staff, consultants, updating family and interview/physical exam; more than 50% of that time was spent in counseling and/or coordination of care.    Alvira Philips [place], DO [facility] Available via Epic secure chat 7am-7pm After these hours, please refer to coverage provider listed on [email or web address] 10/20/2022, 1:55 PM

## 2022-10-21 NOTE — Progress Notes (Signed)
PROGRESS NOTE    Danny Mccoy  UJW:[id number] DOB: 1985-04-25 DOA: 10/15/2022 PCP: Oneita Hurt, No    Brief Narrative:   Danny Mccoy is a 37 y.o. male with past medical history significant for persistent UTI symptoms since August 2024, recently diagnosed with ESBL UTI in which she has had a course of Macrobid and fosfomycin.  Despite treatment, patient reported persistent symptoms such as malodor, increased urinary frequency, intermittent fevers and flank pain.  Denies nausea/vomiting/diarrhea, no abdominal pain.  In the ED, temperature 97.7 F, HR 72, RR 18, BP 117/83, SpO2 97% on room air.  WBC 7.1, hemoglobin 14.1, platelet count 244.  Sodium 140, potassium 3.7, chloride 105, CO2 27, glucose 69, BUN 9, creatinine 1.11.  Urinalysis with small leukocytes, positive nitrite, many bacteria, 21-50 WBCs.  CT abdomen/pelvis with no acute findings.  Chlamydia/gonorrhea negative.  Patient was started on meropenem.  TRH consulted for admission for further evaluation management of ESBL UTI.  Assessment & Plan:   ESBL E. coli UTI Patient representing to the hospital with persistent UTI symptoms such as malodor, increased urinary frequency, intermittent fevers and flank pain.  CT abdomen/pelvis negative.  Urinalysis on admission with small leukocytes, positive nitrite, many bacteria, 21-50 WBCs; but unfortunate unable to add on culture.  Urine culture from 09/30/2022 with 20K ESBL E. Coli.  Patient was seen by infectious disease, changed to ertapenem.  Given homelessness, will need to stay inpatient to complete antibiotic course. -- Ertapenem 1 g IV every 24 hours, will plan 10-day course (End: 9/14) -- Has follow-up appointment with infectious disease on 10/28/2022 at 3:45 PM -- Will need patient follow-up with urology -- Contact precautions  Incomplete right bundle branch block Denies any cardiac symptoms.  TTE 2022 with LVEF 60 to 65%.  Continue outpatient follow-up.  Homelessness Currently  sleeping in his car.  Left previous living situation due to safety concerns.  Not interested in shelters. -- TOC following  Obesity Body mass index is 29.99 kg/m.   DVT prophylaxis: Ambulation    Code Status: Full Code Family Communication: No family present at bedside this morning  Disposition Plan:  Level of care: Med-Surg Status is: Inpatient Remains inpatient appropriate because: IV antibiotics    Consultants:  Infectious disease  Procedures:  None  Antimicrobials:  Meropenem 9/5 - 9/7 Ertapenem 9/8>>   Subjective: Patient seen examined bedside, resting company.  Lying in bed.  No specific complaints this morning.  Remains on IV antibiotics.   Denies headache, no dizziness, no chest pain, no palpitations, no shortness of breath, no abdominal pain, no fever/chills/night sweats, no nausea/vomiting/diarrhea, no focal weakness, no fatigue, no paresthesias.  No acute events overnight per nursing staff.  Objective: Vitals:   10/20/22 2308 10/21/22 0451 10/21/22 0500 10/21/22 0814  BP: (!) 138/90 (!) 112/58  105/69  Pulse: 73 66  66  Resp: 20 20  17   Temp: 98.6 F (37 C) 98.4 F (36.9 C)  98.1 F (36.7 C)  TempSrc:      SpO2: 100% 99%  99%  Weight:   94.8 kg   Height:        Intake/Output Summary (Last 24 hours) at 10/21/2022 0854 Last data filed at 10/21/2022 0854 Gross per 24 hour  Intake 800 ml  Output 0 ml  Net 800 ml   Filed Weights   10/16/22 0351 10/20/22 0449 10/21/22 0500  Weight: 95.7 kg 94.8 kg 94.8 kg    Examination:  Physical Exam: GEN: NAD, alert and oriented x  3, wd/wn HEENT: NCAT, PERRL, EOMI, sclera clear, MMM PULM: CTAB w/o wheezes/crackles, normal respiratory effort, on room air CV: RRR w/o M/G/R GI: abd soft, NTND, NABS, no R/G/M MSK: no peripheral edema, muscle strength globally intact 5/5 bilateral upper/lower extremities NEURO: CN II-XII intact, no focal deficits, sensation to light touch intact PSYCH: normal  mood/affect Integumentary: dry/intact, no rashes or wounds    Data Reviewed: I have personally reviewed following labs and imaging studies  CBC: Recent Labs  Lab 10/15/22 2159 10/16/22 0725 10/17/22 1616  WBC 7.1 5.8 6.1  NEUTROABS 3.7  --  3.3  HGB 14.1 13.9 14.5  HCT 41.7 41.2 42.6  MCV 92.1 89.4 89.1  PLT 244 221 225   Basic Metabolic Panel: Recent Labs  Lab 10/15/22 2159 10/16/22 0725 10/17/22 1616  NA 140 136 138  K 3.7 3.6 3.6  CL 105 101 105  CO2 27 25 25   GLUCOSE 69* 93 104*  BUN 9 7 5*  CREATININE 1.11 1.01 0.97  CALCIUM 8.2* 8.5* 8.3*  MG  --   --  2.1  PHOS  --   --  3.0   GFR: Estimated Creatinine Clearance: 120.5 mL/min (by C-G formula based on SCr of 0.97 mg/dL). Liver Function Tests: Recent Labs  Lab 10/16/22 0725 10/17/22 1616  AST 17 17  ALT 23 21  ALKPHOS 58 69  BILITOT 1.4* 1.0  PROT 6.0* 6.3*  ALBUMIN 3.5 3.6   No results for input(s): "LIPASE", "AMYLASE" in the last 168 hours. No results for input(s): "AMMONIA" in the last 168 hours. Coagulation Profile: No results for input(s): "INR", "PROTIME" in the last 168 hours. Cardiac Enzymes: No results for input(s): "CKTOTAL", "CKMB", "CKMBINDEX", "TROPONINI" in the last 168 hours. BNP (last 3 results) No results for input(s): "PROBNP" in the last 8760 hours. HbA1C: No results for input(s): "HGBA1C" in the last 72 hours. CBG: Recent Labs  Lab 10/15/22 2359 10/16/22 0747 10/16/22 1123 10/16/22 1624 10/16/22 2026  GLUCAP 104* 106* 89 78 93   Lipid Profile: No results for input(s): "CHOL", "HDL", "LDLCALC", "TRIG", "CHOLHDL", "LDLDIRECT" in the last 72 hours. Thyroid Function Tests: No results for input(s): "TSH", "T4TOTAL", "FREET4", "T3FREE", "THYROIDAB" in the last 72 hours. Anemia Panel: No results for input(s): "VITAMINB12", "FOLATE", "FERRITIN", "TIBC", "IRON", "RETICCTPCT" in the last 72 hours. Sepsis Labs: No results for input(s): "PROCALCITON", "LATICACIDVEN" in the  last 168 hours.  No results found for this or any previous visit (from the past 240 hour(s)).       Radiology Studies: No results found.      Scheduled Meds: Continuous Infusions:  ertapenem 1 g (10/20/22 0911)     LOS: 6 days    Time spent: 50 minutes spent on chart review, discussion with nursing staff, consultants, updating family and interview/physical exam; more than 50% of that time was spent in counseling and/or coordination of care.    Alvira Philips [place], DO [facility] Available via Epic secure chat 7am-7pm After these hours, please refer to coverage provider listed on [email or web address] 10/21/2022, 8:54 AM

## 2022-10-22 MED ORDER — SODIUM CHLORIDE 0.9 % IV SOLN: INTRAVENOUS | Status: DC | PRN

## 2022-10-22 NOTE — Progress Notes (Signed)
Patient called out to [facility] to request to speak to nurse.  Patient requesting to have a private hospitalization encounter where no one is able to see that he is admitted. He would not like anyone to be able to visit him.  I verbalized understanding and called to Admitting Department to ask that patient's hospitalization be made private.

## 2022-10-22 NOTE — Progress Notes (Signed)
PROGRESS NOTE    Danny Mccoy  VOZ:[id number] DOB: 09-07-1985 DOA: 10/15/2022 PCP: Oneita Hurt, No    Brief Narrative:   Danny Mccoy is a 37 y.o. male with past medical history significant for persistent UTI symptoms since August 2024, recently diagnosed with ESBL UTI in which she has had a course of Macrobid and fosfomycin.  Despite treatment, patient reported persistent symptoms such as malodor, increased urinary frequency, intermittent fevers and flank pain.  Denies nausea/vomiting/diarrhea, no abdominal pain.  In the ED, temperature 97.7 F, HR 72, RR 18, BP 117/83, SpO2 97% on room air.  WBC 7.1, hemoglobin 14.1, platelet count 244.  Sodium 140, potassium 3.7, chloride 105, CO2 27, glucose 69, BUN 9, creatinine 1.11.  Urinalysis with small leukocytes, positive nitrite, many bacteria, 21-50 WBCs.  CT abdomen/pelvis with no acute findings.  Chlamydia/gonorrhea negative.  Patient was started on meropenem.  TRH consulted for admission for further evaluation management of ESBL UTI.  Assessment & Plan:   ESBL E. coli UTI Patient representing to the hospital with persistent UTI symptoms such as malodor, increased urinary frequency, intermittent fevers and flank pain.  CT abdomen/pelvis negative.  Urinalysis on admission with small leukocytes, positive nitrite, many bacteria, 21-50 WBCs; but unfortunate unable to add on culture.  Urine culture from 09/30/2022 with 20K ESBL E. Coli.  Patient was seen by infectious disease, changed to ertapenem.  Given homelessness, will need to stay inpatient to complete antibiotic course. -- Ertapenem 1 g IV every 24 hours, will plan 10-day course (End: 9/14) -- Has follow-up appointment with infectious disease on 10/28/2022 at 3:45 PM -- Will need patient follow-up with urology -- Contact precautions  Incomplete right bundle branch block Denies any cardiac symptoms.  TTE 2022 with LVEF 60 to 65%.  Continue outpatient follow-up.  Homelessness Currently  sleeping in his car.  Left previous living situation due to safety concerns.  Not interested in shelters. -- TOC following  Obesity Body mass index is 29.99 kg/m.   DVT prophylaxis: Ambulation    Code Status: Full Code Family Communication: No family present at bedside this morning  Disposition Plan:  Level of care: Med-Surg Status is: Inpatient Remains inpatient appropriate because: IV antibiotics    Consultants:  Infectious disease  Procedures:  None  Antimicrobials:  Meropenem 9/5 - 9/7 Ertapenem 9/8>>   Subjective: Patient seen examined bedside, lying in bed sleeping but easily arousable.  No specific complaints this morning.  Remains on IV antibiotics.   Denies headache, no dizziness, no chest pain, no palpitations, no shortness of breath, no abdominal pain, no fever/chills/night sweats, no nausea/vomiting/diarrhea, no focal weakness, no fatigue, no paresthesias.  No acute events overnight per nursing staff.  Objective: Vitals:   10/21/22 2002 10/22/22 0500 10/22/22 0535 10/22/22 0821  BP: 120/68  101/69 107/77  Pulse: (!) 58  (!) 55 60  Resp: (!) 21  (!) 21 17  Temp: 98.7 F (37.1 C)  98.6 F (37 C) 98.2 F (36.8 C)  TempSrc:      SpO2: 100%  99% 98%  Weight:  94.8 kg    Height:        Intake/Output Summary (Last 24 hours) at 10/22/2022 1230 Last data filed at 10/22/2022 0900 Gross per 24 hour  Intake 700 ml  Output 0 ml  Net 700 ml   Filed Weights   10/20/22 0449 10/21/22 0500 10/22/22 0500  Weight: 94.8 kg 94.8 kg 94.8 kg    Examination:  Physical Exam: GEN: NAD, alert  and oriented x 3, wd/wn HEENT: NCAT, PERRL, EOMI, sclera clear, MMM PULM: CTAB w/o wheezes/crackles, normal respiratory effort, on room air CV: RRR w/o M/G/R GI: abd soft, NTND, NABS, no R/G/M MSK: no peripheral edema, muscle strength globally intact 5/5 bilateral upper/lower extremities NEURO: CN II-XII intact, no focal deficits, sensation to light touch intact PSYCH: normal  mood/affect Integumentary: dry/intact, no rashes or wounds    Data Reviewed: I have personally reviewed following labs and imaging studies  CBC: Recent Labs  Lab 10/15/22 2159 10/16/22 0725 10/17/22 1616  WBC 7.1 5.8 6.1  NEUTROABS 3.7  --  3.3  HGB 14.1 13.9 14.5  HCT 41.7 41.2 42.6  MCV 92.1 89.4 89.1  PLT 244 221 225   Basic Metabolic Panel: Recent Labs  Lab 10/15/22 2159 10/16/22 0725 10/17/22 1616  NA 140 136 138  K 3.7 3.6 3.6  CL 105 101 105  CO2 27 25 25   GLUCOSE 69* 93 104*  BUN 9 7 5*  CREATININE 1.11 1.01 0.97  CALCIUM 8.2* 8.5* 8.3*  MG  --   --  2.1  PHOS  --   --  3.0   GFR: Estimated Creatinine Clearance: 120.5 mL/min (by C-G formula based on SCr of 0.97 mg/dL). Liver Function Tests: Recent Labs  Lab 10/16/22 0725 10/17/22 1616  AST 17 17  ALT 23 21  ALKPHOS 58 69  BILITOT 1.4* 1.0  PROT 6.0* 6.3*  ALBUMIN 3.5 3.6   No results for input(s): "LIPASE", "AMYLASE" in the last 168 hours. No results for input(s): "AMMONIA" in the last 168 hours. Coagulation Profile: No results for input(s): "INR", "PROTIME" in the last 168 hours. Cardiac Enzymes: No results for input(s): "CKTOTAL", "CKMB", "CKMBINDEX", "TROPONINI" in the last 168 hours. BNP (last 3 results) No results for input(s): "PROBNP" in the last 8760 hours. HbA1C: No results for input(s): "HGBA1C" in the last 72 hours. CBG: Recent Labs  Lab 10/15/22 2359 10/16/22 0747 10/16/22 1123 10/16/22 1624 10/16/22 2026  GLUCAP 104* 106* 89 78 93   Lipid Profile: No results for input(s): "CHOL", "HDL", "LDLCALC", "TRIG", "CHOLHDL", "LDLDIRECT" in the last 72 hours. Thyroid Function Tests: No results for input(s): "TSH", "T4TOTAL", "FREET4", "T3FREE", "THYROIDAB" in the last 72 hours. Anemia Panel: No results for input(s): "VITAMINB12", "FOLATE", "FERRITIN", "TIBC", "IRON", "RETICCTPCT" in the last 72 hours. Sepsis Labs: No results for input(s): "PROCALCITON", "LATICACIDVEN" in the  last 168 hours.  No results found for this or any previous visit (from the past 240 hour(s)).       Radiology Studies: No results found.      Scheduled Meds: Continuous Infusions:  sodium chloride 10 mL/hr at 10/22/22 1113   ertapenem 1 g (10/22/22 1114)     LOS: 7 days    Time spent: 50 minutes spent on chart review, discussion with nursing staff, consultants, updating family and interview/physical exam; more than 50% of that time was spent in counseling and/or coordination of care.    Alvira Philips [place], DO [facility] Available via Epic secure chat 7am-7pm After these hours, please refer to coverage provider listed on [email or web address] 10/22/2022, 12:30 PM

## 2022-10-22 NOTE — Plan of Care (Signed)
Alert and oriented. Ambulated in hallways and in room independently.  Patient requested to have his hospitalization marked as private. Admitting Department called and request conveyed. Hospitalization updated to mark as private encounter.  Tolerating IV antibiotics. Will likely discharge once IV antibiotics course complete.   Problem: Health Behavior/Discharge Planning: Goal: Ability to manage health-related needs will improve Outcome: Progressing   Problem: Clinical Measurements: Goal: Ability to maintain clinical measurements within normal limits will improve Outcome: Progressing Goal: Will remain free from infection Outcome: Progressing Goal: Diagnostic test results will improve Outcome: Progressing Goal: Respiratory complications will improve Outcome: Progressing Goal: Cardiovascular complication will be avoided Outcome: Progressing   Problem: Activity: Goal: Risk for activity intolerance will decrease Outcome: Progressing   Problem: Nutrition: Goal: Adequate nutrition will be maintained Outcome: Progressing   Problem: Coping: Goal: Level of anxiety will decrease Outcome: Progressing   Problem: Elimination: Goal: Will not experience complications related to bowel motility Outcome: Progressing Goal: Will not experience complications related to urinary retention Outcome: Progressing   Problem: Pain Managment: Goal: General experience of comfort will improve Outcome: Progressing   Problem: Safety: Goal: Ability to remain free from injury will improve Outcome: Progressing   Problem: Skin Integrity: Goal: Risk for impaired skin integrity will decrease Outcome: Progressing   Problem: Urinary Elimination: Goal: Signs and symptoms of infection will decrease Outcome: Progressing

## 2022-10-22 NOTE — TOC Progression Note (Addendum)
Transition of Care [facility]) - Progression Note    Patient Details  Name: Danny Mccoy MRN: [id number] Date of Birth: [birth year]  [facility]) CM/SW Contact  Tom-Johnson, Hershal Coria, RN Phone Number: 10/22/2022, 4:14 PM  Clinical Narrative:     Patient remains inpatient d/t IV abx tx. Patient is homeless which is a barrier for discharge at this time. Patient will be discharged after completion of IV abx.  CM consulted for Medication Assistance, patient does not have Medical Insurance, MATCH will be done for prescription at discharge.   CM will continue to follow as patient progresses with care towards discharge. .          Expected Discharge Plan: Home/Self Care Barriers to Discharge: Continued Medical Work up  Expected Discharge Plan and Services   Discharge Planning Services: CM Consult   Living arrangements for the past 2 months: Single Family Home                                       Social Determinants of Health (SDOH) Interventions SDOH Screenings   Food Insecurity: No Food Insecurity (10/19/2022)  Housing: High Risk (10/19/2022)  Transportation Needs: No Transportation Needs (10/19/2022)  Utilities: At Risk (10/19/2022)  Tobacco Use: Low Risk  (10/15/2022)    Readmission Risk Interventions    10/19/2022   11:11 AM  Readmission Risk Prevention Plan  Transportation Screening Complete  PCP or Specialist Appt within 5-7 Days Complete  Home Care Screening Complete  Medication Review (RN CM) Referral to Pharmacy

## 2022-10-23 MED ORDER — HYDROCERIN EX CREA
TOPICAL_CREAM | Freq: Three times a day (TID) | CUTANEOUS | Status: DC | PRN
  Filled 2022-10-23: qty 113

## 2022-10-23 NOTE — Progress Notes (Signed)
PROGRESS NOTE    Danny Seman  WUJ:[id number] DOB: 1985/03/03 DOA: 10/15/2022 PCP: Oneita Hurt, No    Brief Narrative:   Danny Mccoy is a 37 y.o. male with past medical history significant for persistent UTI symptoms since August 2024, recently diagnosed with ESBL UTI in which she has had a course of Macrobid and fosfomycin.  Despite treatment, patient reported persistent symptoms such as malodor, increased urinary frequency, intermittent fevers and flank pain.  Denies nausea/vomiting/diarrhea, no abdominal pain.  In the ED, temperature 97.7 F, HR 72, RR 18, BP 117/83, SpO2 97% on room air.  WBC 7.1, hemoglobin 14.1, platelet count 244.  Sodium 140, potassium 3.7, chloride 105, CO2 27, glucose 69, BUN 9, creatinine 1.11.  Urinalysis with small leukocytes, positive nitrite, many bacteria, 21-50 WBCs.  CT abdomen/pelvis with no acute findings.  Chlamydia/gonorrhea negative.  Patient was started on meropenem.  TRH consulted for admission for further evaluation management of ESBL UTI.  Assessment & Plan:   ESBL E. coli UTI Patient representing to the hospital with persistent UTI symptoms such as malodor, increased urinary frequency, intermittent fevers and flank pain.  CT abdomen/pelvis negative.  Urinalysis on admission with small leukocytes, positive nitrite, many bacteria, 21-50 WBCs; but unfortunate unable to add on culture.  Urine culture from 09/30/2022 with 20K ESBL E. Coli.  Patient was seen by infectious disease, changed to ertapenem.  Given homelessness, will need to stay inpatient to complete antibiotic course. -- Ertapenem 1 g IV every 24 hours, will plan 10-day course (End: 9/14) -- Has follow-up appointment with infectious disease on 10/28/2022 at 3:45 PM -- Will need patient follow-up with urology -- Contact precautions  Incomplete right bundle branch block Denies any cardiac symptoms.  TTE 2022 with LVEF 60 to 65%.  Continue outpatient follow-up.  Homelessness Currently  sleeping in his car.  Left previous living situation due to safety concerns.  Not interested in shelters. -- TOC following  Obesity Body mass index is 29.99 kg/m.   DVT prophylaxis: Ambulation    Code Status: Full Code Family Communication: No family present at bedside this morning  Disposition Plan:  Level of care: Med-Surg Status is: Inpatient Remains inpatient appropriate because: IV antibiotics    Consultants:  Infectious disease  Procedures:  None  Antimicrobials:  Meropenem 9/5 - 9/7 Ertapenem 9/8>>   Subjective: Patient seen examined bedside, lying in bed sleeping but easily arousable.  No specific complaints this morning.  Remains on IV antibiotics.   Denies headache, no dizziness, no chest pain, no palpitations, no shortness of breath, no abdominal pain, no fever/chills/night sweats, no nausea/vomiting/diarrhea, no focal weakness, no fatigue, no paresthesias.  No acute events overnight per nursing staff.  Discussed plan discharge home tomorrow after receives final dose of IV antibiotics.  Objective: Vitals:   10/22/22 1558 10/22/22 2118 10/23/22 0535 10/23/22 0949  BP: 126/81 126/77 103/67 102/64  Pulse: 73 61 (!) 53 (!) 53  Resp: 19 16 16 20   Temp: 98.4 F (36.9 C) 98.3 F (36.8 C) 98.2 F (36.8 C) (!) 97.5 F (36.4 C)  TempSrc:      SpO2: 100% 100% 99% 99%  Weight:      Height:        Intake/Output Summary (Last 24 hours) at 10/23/2022 1058 Last data filed at 10/22/2022 2118 Gross per 24 hour  Intake 445.53 ml  Output 0 ml  Net 445.53 ml   Filed Weights   10/20/22 0449 10/21/22 0500 10/22/22 0500  Weight: 94.8 kg  94.8 kg 94.8 kg    Examination:  Physical Exam: GEN: NAD, alert and oriented x 3, wd/wn HEENT: NCAT, PERRL, EOMI, sclera clear, MMM PULM: CTAB w/o wheezes/crackles, normal respiratory effort, on room air CV: RRR w/o M/G/R GI: abd soft, NTND, NABS, no R/G/M MSK: no peripheral edema, muscle strength globally intact 5/5 bilateral  upper/lower extremities NEURO: CN II-XII intact, no focal deficits, sensation to light touch intact PSYCH: normal mood/affect Integumentary: dry/intact, no rashes or wounds    Data Reviewed: I have personally reviewed following labs and imaging studies  CBC: Recent Labs  Lab 10/17/22 1616  WBC 6.1  NEUTROABS 3.3  HGB 14.5  HCT 42.6  MCV 89.1  PLT 225   Basic Metabolic Panel: Recent Labs  Lab 10/17/22 1616  NA 138  K 3.6  CL 105  CO2 25  GLUCOSE 104*  BUN 5*  CREATININE 0.97  CALCIUM 8.3*  MG 2.1  PHOS 3.0   GFR: Estimated Creatinine Clearance: 120.5 mL/min (by C-G formula based on SCr of 0.97 mg/dL). Liver Function Tests: Recent Labs  Lab 10/17/22 1616  AST 17  ALT 21  ALKPHOS 69  BILITOT 1.0  PROT 6.3*  ALBUMIN 3.6   No results for input(s): "LIPASE", "AMYLASE" in the last 168 hours. No results for input(s): "AMMONIA" in the last 168 hours. Coagulation Profile: No results for input(s): "INR", "PROTIME" in the last 168 hours. Cardiac Enzymes: No results for input(s): "CKTOTAL", "CKMB", "CKMBINDEX", "TROPONINI" in the last 168 hours. BNP (last 3 results) No results for input(s): "PROBNP" in the last 8760 hours. HbA1C: No results for input(s): "HGBA1C" in the last 72 hours. CBG: Recent Labs  Lab 10/16/22 1123 10/16/22 1624 10/16/22 2026  GLUCAP 89 78 93   Lipid Profile: No results for input(s): "CHOL", "HDL", "LDLCALC", "TRIG", "CHOLHDL", "LDLDIRECT" in the last 72 hours. Thyroid Function Tests: No results for input(s): "TSH", "T4TOTAL", "FREET4", "T3FREE", "THYROIDAB" in the last 72 hours. Anemia Panel: No results for input(s): "VITAMINB12", "FOLATE", "FERRITIN", "TIBC", "IRON", "RETICCTPCT" in the last 72 hours. Sepsis Labs: No results for input(s): "PROCALCITON", "LATICACIDVEN" in the last 168 hours.  No results found for this or any previous visit (from the past 240 hour(s)).       Radiology Studies: No results  found.      Scheduled Meds: Continuous Infusions:  sodium chloride Stopped (10/22/22 1216)   ertapenem 1 g (10/23/22 1002)     LOS: 8 days    Time spent: 47 minutes spent on chart review, discussion with nursing staff, consultants, updating family and interview/physical exam; more than 50% of that time was spent in counseling and/or coordination of care.    Alvira Philips [place], DO [facility] Available via Epic secure chat 7am-7pm After these hours, please refer to coverage provider listed on [email or web address] 10/23/2022, 10:58 AM

## 2022-10-24 NOTE — Plan of Care (Signed)
Ready for discharge

## 2022-10-24 NOTE — Progress Notes (Signed)
DISCHARGE NOTE HOME Danny Mccoy to be discharged Home per MD order. Discussed prescriptions and follow up appointments with the patient. Prescriptions given to patient; medication list explained in detail. Patient verbalized understanding.  Skin clean, dry and intact without evidence of skin break down, no evidence of skin tears noted. IV catheter discontinued intact. Site without signs and symptoms of complications. Dressing and pressure applied. Pt denies pain at the site currently. No complaints noted.  Patient free of lines, drains, and wounds.   An After Visit Summary (AVS) was printed and given to the patient. Patient escorted via wheelchair, and discharged home via private auto.  Margarita Grizzle, RN

## 2022-10-24 NOTE — Discharge Summary (Signed)
Physician Discharge Summary  Danny Mccoy WUJ:[id number] DOB: Jun 11, 1985 DOA: 10/15/2022  PCP: Pcp, No  Admit date: 10/15/2022 Discharge date: 10/24/2022  Admitted From: Home Disposition: Home  Recommendations for Outpatient Follow-up:  Follow up with PCP in 1-2 weeks  Please obtain BMP/CBC in one week Please follow up on the following pending results:  Home Health:  Equipment/Devices:   Discharge Condition:  CODE STATUS:  Diet recommendation:   History of present illness:  Danny Mccoy is a 37 y.o. male with past medical history significant for persistent UTI symptoms since August 2024, recently diagnosed with ESBL UTI in which she has had a course of Macrobid and fosfomycin.  Despite treatment, patient reported persistent symptoms such as malodor, increased urinary frequency, intermittent fevers and flank pain.  Denies nausea/vomiting/diarrhea, no abdominal pain.   In the ED, temperature 97.7 F, HR 72, RR 18, BP 117/83, SpO2 97% on room air.  WBC 7.1, hemoglobin 14.1, platelet count 244.  Sodium 140, potassium 3.7, chloride 105, CO2 27, glucose 69, BUN 9, creatinine 1.11.  Urinalysis with small leukocytes, positive nitrite, many bacteria, 21-50 WBCs.  CT abdomen/pelvis with no acute findings.  Chlamydia/gonorrhea negative.  Patient was started on meropenem.  TRH consulted for admission for further evaluation management of ESBL UTI.  Hospital course:  ESBL E. coli UTI Patient representing to the hospital with persistent UTI symptoms such as malodor, increased urinary frequency, intermittent fevers and flank pain.  CT abdomen/pelvis negative.  Urinalysis on admission with small leukocytes, positive nitrite, many bacteria, 21-50 WBCs; but unfortunate unable to add on culture.  Urine culture from 09/30/2022 with 20K ESBL E. Coli.  Patient was seen by infectious disease, changed to ertapenem.  Given homelessness, remained inpatient and completed 10-day course of IV  antibiotics. Has follow-up appointment with infectious disease on 10/28/2022 at 3:45 PM. Will need patient follow-up with urology   Incomplete right bundle branch block Denies any cardiac symptoms.  TTE 2022 with LVEF 60 to 65%.  Continue outpatient follow-up.   Homelessness Currently sleeping in his car.  Left previous living situation due to safety concerns.  Not interested in shelters.  Seen by transitions of care.   Overweight Body mass index is 29.99 kg/m.  Complicates all facets of care  Discharge Diagnoses:  Principal Problem:   UTI (urinary tract infection) Active Problems:   ESBL (extended spectrum beta-lactamase) producing bacteria infection    Discharge Instructions  Discharge Instructions     Call MD for:  difficulty breathing, headache or visual disturbances   Complete by: As directed    Call MD for:  extreme fatigue   Complete by: As directed    Call MD for:  persistant dizziness or light-headedness   Complete by: As directed    Call MD for:  persistant nausea and vomiting   Complete by: As directed    Call MD for:  severe uncontrolled pain   Complete by: As directed    Call MD for:  temperature >100.4   Complete by: As directed    Diet - low sodium heart healthy   Complete by: As directed    Increase activity slowly   Complete by: As directed       Allergies as of 10/24/2022   No Known Allergies      Medication List     STOP taking these medications    nitrofurantoin (macrocrystal-monohydrate) 100 MG capsule Commonly known as: MACROBID        Follow-up Information  [facility] Follow up on 10/28/2022.   Specialty: Internal Medicine Why: Your appointment is at 1 pm. Please arrive early and bring a picture ID and current medications. Contact information: [address] [place] [address] [phone]        [facility] Follow up.   Why: Use this pharmacy to assist with the cost of  medications Contact information: [address], [place], [place] [address]  Phone: [phone]        [facility] Follow up.   Why: Call for a follow up appointment Contact information: [address] [place] [address] [phone]        Odette Fraction, MD Follow up.   Specialty: Infectious Diseases Why: you have an appointment on 9/18 at 3:45 PM Contact information: [address] [employer] [address] [place] [address] [phone]                No Known Allergies  Consultations: Infectious disease   Procedures/Studies: CT ABDOMEN PELVIS WO CONTRAST  Result Date: 10/16/2022 CLINICAL DATA:  Complicated urinary tract infection. Possible admission for persistent UTI symptoms since mid August. On antibiotics. EXAM: CT ABDOMEN AND PELVIS WITHOUT CONTRAST TECHNIQUE: Multidetector CT imaging of the abdomen and pelvis was performed following the standard protocol without IV contrast. RADIATION DOSE REDUCTION: This exam was performed according to the departmental dose-optimization program which includes automated exposure control, adjustment of the mA and/or kV according to patient size and/or use of iterative reconstruction technique. COMPARISON:  None Available. FINDINGS: Lower chest: Mild dependent atelectasis at both lung bases. No significant pleural or pericardial effusion. Hepatobiliary: The liver appears unremarkable as imaged in the noncontrast state. No evidence of gallstones, gallbladder wall thickening or biliary dilatation. Pancreas: Unremarkable. No pancreatic ductal dilatation or surrounding inflammatory changes. Spleen: Normal in size without focal abnormality. Adrenals/Urinary Tract: Both adrenal glands appear normal. Both kidneys appear unremarkable as imaged in the noncontrast state. There is no evidence of urinary tract calculus, hydronephrosis or perinephric soft tissue stranding. The bladder appears normal for its  degree of distention. Stomach/Bowel: No enteric contrast administered. The stomach appears unremarkable for its degree of distension. No evidence of bowel wall thickening, distention or surrounding inflammatory change. The appendix appears normal. Vascular/Lymphatic: There are no enlarged abdominal or pelvic lymph nodes. No significant vascular findings on noncontrast imaging. Reproductive: The prostate gland and seminal vesicles appear unremarkable. Other: No evidence of abdominal wall mass or hernia. No ascites or pneumoperitoneum. Musculoskeletal: No acute or significant osseous findings. IMPRESSION: No acute findings or explanation for the patient's symptoms. Specifically, no evidence of urinary tract calculus, hydronephrosis or complicated urinary tract infection. Electronically Signed   By: Carey Bullocks M.D.   On: 10/16/2022 12:13     Subjective: Patient seen examined bedside, resting calmly.  Completed 10-day course of IV antibiotics today.  Has outpatient follow-up scheduled with infectious disease upcoming.  No other specific complaints or concerns at this time.  Denies headache, no dizziness, no chest pain, no palpitations, no shortness of breath, no abdominal pain, no fever/chills/night sweats, no nausea/vomiting/diarrhea, no focal weakness, no fatigue, no paresthesias.  No acute events overnight per nursing.  Discharge Exam: Vitals:   10/24/22 0438 10/24/22 0803  BP: (!) 113/91 129/64  Pulse: (!) 55 64  Resp: 16 17  Temp: 97.7 F (36.5 C) 97.8 F (36.6 C)  SpO2: 98% 99%   Vitals:   10/23/22 1700 10/23/22 2032  10/24/22 0438 10/24/22 0803  BP: 104/89 116/67 (!) 113/91 129/64  Pulse: 63 61 (!) 55 64  Resp: 19 16 16 17   Temp: 97.8 F (36.6 C) 98.4 F (36.9 C) 97.7 F (36.5 C) 97.8 F (36.6 C)  TempSrc:  Oral Oral   SpO2: 100% 100% 98% 99%  Weight:      Height:        Physical Exam: GEN: NAD, alert and oriented x 3, wd/wn HEENT: NCAT, PERRL, EOMI, sclera clear, MMM PULM:  CTAB w/o wheezes/crackles, normal respiratory effort, on room air CV: RRR w/o M/G/R GI: abd soft, NTND, NABS, no R/G/M MSK: no peripheral edema, muscle strength globally intact 5/5 bilateral upper/lower extremities NEURO: CN II-XII intact, no focal deficits, sensation to light touch intact PSYCH: normal mood/affect Integumentary: dry/intact, no rashes or wounds    The results of significant diagnostics from this hospitalization (including imaging, microbiology, ancillary and laboratory) are listed below for reference.     Microbiology: No results found for this or any previous visit (from the past 240 hour(s)).   Labs: BNP (last 3 results) No results for input(s): "BNP" in the last 8760 hours. Basic Metabolic Panel: Recent Labs  Lab 10/17/22 1616  NA 138  K 3.6  CL 105  CO2 25  GLUCOSE 104*  BUN 5*  CREATININE 0.97  CALCIUM 8.3*  MG 2.1  PHOS 3.0   Liver Function Tests: Recent Labs  Lab 10/17/22 1616  AST 17  ALT 21  ALKPHOS 69  BILITOT 1.0  PROT 6.3*  ALBUMIN 3.6   No results for input(s): "LIPASE", "AMYLASE" in the last 168 hours. No results for input(s): "AMMONIA" in the last 168 hours. CBC: Recent Labs  Lab 10/17/22 1616  WBC 6.1  NEUTROABS 3.3  HGB 14.5  HCT 42.6  MCV 89.1  PLT 225   Cardiac Enzymes: No results for input(s): "CKTOTAL", "CKMB", "CKMBINDEX", "TROPONINI" in the last 168 hours. BNP: Invalid input(s): "POCBNP" CBG: No results for input(s): "GLUCAP" in the last 168 hours. D-Dimer No results for input(s): "DDIMER" in the last 72 hours. Hgb A1c No results for input(s): "HGBA1C" in the last 72 hours. Lipid Profile No results for input(s): "CHOL", "HDL", "LDLCALC", "TRIG", "CHOLHDL", "LDLDIRECT" in the last 72 hours. Thyroid function studies No results for input(s): "TSH", "T4TOTAL", "T3FREE", "THYROIDAB" in the last 72 hours.  Invalid input(s): "FREET3" Anemia work up No results for input(s): "VITAMINB12", "FOLATE", "FERRITIN",  "TIBC", "IRON", "RETICCTPCT" in the last 72 hours. Urinalysis    Component Value Date/Time   COLORURINE YELLOW 10/15/2022 2000   APPEARANCEUR HAZY (A) 10/15/2022 2000   LABSPEC 1.025 10/15/2022 2000   PHURINE 6.0 10/15/2022 2000   GLUCOSEU NEGATIVE 10/15/2022 2000   HGBUR NEGATIVE 10/15/2022 2000   BILIRUBINUR NEGATIVE 10/15/2022 2000   BILIRUBINUR negative 09/30/2022 1737   KETONESUR NEGATIVE 10/15/2022 2000   PROTEINUR NEGATIVE 10/15/2022 2000   UROBILINOGEN >=8.0 (A) 09/30/2022 1737   UROBILINOGEN 0.2 01/02/2022 1815   NITRITE POSITIVE (A) 10/15/2022 2000   LEUKOCYTESUR SMALL (A) 10/15/2022 2000   Sepsis Labs Recent Labs  Lab 10/17/22 1616  WBC 6.1   Microbiology No results found for this or any previous visit (from the past 240 hour(s)).   Time coordinating discharge: Over 30 minutes  SIGNED:   Alvira Philips [place], DO  [facility] 10/24/2022, 9:37 AM

## 2022-10-24 NOTE — TOC Transition Note (Signed)
Transition of Care [facility]) - CM/SW Discharge Note   Patient Details  Name: Danny Mccoy MRN: [id number] Date of Birth: 12-26-1985  Transition of Care [facility]) CM/SW Contact:  Ronny Bacon, RN Phone Number: 10/24/2022, 10:41 AM   Clinical Narrative:  Patient is expected to be discharged today. Patient completed IV antibiotics as inpatient and will receive last dose before discharge. According to floor RN Randa Evens patient needs midline removed, IV team made aware.     Final next level of care: Home/Self Care Barriers to Discharge: Continued Medical Work up   Patient Goals and CMS Choice      Discharge Placement                         Discharge Plan and Services Additional resources added to the After Visit Summary for     Discharge Planning Services: CM Consult                                 Social Determinants of Health (SDOH) Interventions SDOH Screenings   Food Insecurity: No Food Insecurity (10/19/2022)  Housing: High Risk (10/19/2022)  Transportation Needs: No Transportation Needs (10/19/2022)  Utilities: At Risk (10/19/2022)  Tobacco Use: Low Risk  (10/15/2022)     Readmission Risk Interventions    10/19/2022   11:11 AM  Readmission Risk Prevention Plan  Transportation Screening Complete  PCP or Specialist Appt within 5-7 Days Complete  Home Care Screening Complete  Medication Review (RN CM) Referral to Pharmacy

## 2022-10-28 ENCOUNTER — Encounter: Payer: Self-pay | Admitting: Internal Medicine

## 2022-10-28 ENCOUNTER — Other Ambulatory Visit: Payer: Self-pay | Admitting: Internal Medicine

## 2022-10-28 ENCOUNTER — Ambulatory Visit (INDEPENDENT_AMBULATORY_CARE_PROVIDER_SITE_OTHER): Payer: Medicaid Other | Admitting: Internal Medicine

## 2022-10-28 ENCOUNTER — Encounter: Payer: Self-pay | Admitting: Nurse Practitioner

## 2022-10-28 ENCOUNTER — Other Ambulatory Visit: Payer: Self-pay

## 2022-10-28 ENCOUNTER — Ambulatory Visit (INDEPENDENT_AMBULATORY_CARE_PROVIDER_SITE_OTHER): Payer: Medicaid Other | Admitting: Nurse Practitioner

## 2022-10-28 VITALS — BP 125/83 | HR 62 | Temp 98.4°F | Ht 71.0 in | Wt 215.0 lb

## 2022-10-28 VITALS — BP 131/80 | HR 80 | Resp 16 | Ht 71.0 in | Wt 216.3 lb

## 2022-10-28 DIAGNOSIS — M79642 Pain in left hand: Secondary | ICD-10-CM | POA: Insufficient documentation

## 2022-10-28 DIAGNOSIS — R801 Persistent proteinuria, unspecified: Secondary | ICD-10-CM

## 2022-10-28 DIAGNOSIS — Z1612 Extended spectrum beta lactamase (ESBL) resistance: Secondary | ICD-10-CM

## 2022-10-28 DIAGNOSIS — I4510 Unspecified right bundle-branch block: Secondary | ICD-10-CM | POA: Insufficient documentation

## 2022-10-28 DIAGNOSIS — M79641 Pain in right hand: Secondary | ICD-10-CM

## 2022-10-28 DIAGNOSIS — A499 Bacterial infection, unspecified: Secondary | ICD-10-CM | POA: Diagnosis present

## 2022-10-28 DIAGNOSIS — Z5900 Homelessness unspecified: Secondary | ICD-10-CM | POA: Insufficient documentation

## 2022-10-28 DIAGNOSIS — Z09 Encounter for follow-up examination after completed treatment for conditions other than malignant neoplasm: Secondary | ICD-10-CM | POA: Insufficient documentation

## 2022-10-28 DIAGNOSIS — R809 Proteinuria, unspecified: Secondary | ICD-10-CM | POA: Insufficient documentation

## 2022-10-28 DIAGNOSIS — F321 Major depressive disorder, single episode, moderate: Secondary | ICD-10-CM | POA: Diagnosis not present

## 2022-10-28 NOTE — Assessment & Plan Note (Signed)
Patient has upcoming appointment with infectious disease specialist today Was encouraged to follow-up with urology as well Follow-up in the office as needed if his urinary symptoms reoccurs Denies fever, abdominal pain, flank pain, urinary odor , cloudy urine ,hematuria

## 2022-10-28 NOTE — Progress Notes (Signed)
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   Patient states he had a UTI. Is asking to get rechecked for this.   Patient asked about an EKG states he has heart blockage.

## 2022-10-28 NOTE — Patient Instructions (Signed)
Please follow-up in 1 year or sooner if needed I encourage you to keep your appointment with infectious disease specialist and also see the urologist  Please consider getting your flu vaccine and tetanus vaccine  For  arthritis you can take Tylenol 650 mg every 6 hours as needed    It is important that you exercise regularly at least 30 minutes 5 times a week as tolerated  Think about what you will eat, plan ahead. Choose " clean, green, fresh or frozen" over canned, processed or packaged foods which are more sugary, salty and fatty. 70 to 75% of food eaten should be vegetables and fruit. Three meals at set times with snacks allowed between meals, but they must be fruit or vegetables. Aim to eat over a 12 hour period , example 7 am to 7 pm, and STOP after  your last meal of the day. Drink water,generally about 64 ounces per day, no other drink is as healthy. Fruit juice is best enjoyed in a healthy way, by EATING the fruit.  Thanks for choosing [facility] we consider it a privelige to serve you.

## 2022-10-28 NOTE — Assessment & Plan Note (Signed)
Currently does not have pain in his hands But has intermittent pain when working with his hands Patient encouraged to take Tylenol as needed application of heat or ice also encouraged

## 2022-10-28 NOTE — Assessment & Plan Note (Signed)
Currently sleeps him in his car List of shelters around this area provided Patient states that he starts a new job on Monday Food from the pantry provided to the patient.

## 2022-10-28 NOTE — Assessment & Plan Note (Signed)
Hospital discharge summary labs and imaging studies reviewed

## 2022-10-28 NOTE — Assessment & Plan Note (Addendum)
Currently denies chest pain, palpitations, sob , sycope Patient encouraged to follow-up with cardiology if he develops any chest pain, palpitations Phone number and address to the cardiology office provided

## 2022-10-28 NOTE — Assessment & Plan Note (Signed)
Lab Results  Component Value Date   NA 138 10/17/2022   K 3.6 10/17/2022   CO2 25 10/17/2022   GLUCOSE 104 (H) 10/17/2022   BUN 5 (L) 10/17/2022   CREATININE 0.97 10/17/2022   CALCIUM 8.3 (L) 10/17/2022   GFRNONAA >60 10/17/2022  Patient encouraged to increase intake of foods rich in calcium

## 2022-10-28 NOTE — Assessment & Plan Note (Addendum)
Will have the patient follow-up in the office in 1 month for  his persistent proteinuria.  Will also recheck his CMP, UA, urine microalbumin and  consider nephrology referral

## 2022-10-28 NOTE — Assessment & Plan Note (Signed)
Flowsheet Row Office Visit from 10/28/2022 in [facility]  PHQ-9 Total Score 13     Patient denies SI, HI Declined medication and counseling Information for [facility] behavioral health clinic provided, patient advised to follow-up with them if needed.

## 2022-10-28 NOTE — Progress Notes (Unsigned)
Patient: Danny Mccoy  DOB: [birth year] MRN: [id number] PCP: Donell Beers, FNP  Referring Provider: ***  No chief complaint on file.    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 @GENDER @ with   ROS  Past Medical History:  Diagnosis Date   Anxiety    Asthma    Depression    ESBL (extended spectrum beta-lactamase) producing bacteria infection 10/16/2022   HSV-1 infection    UTI (urinary tract infection) 10/15/2022    No outpatient medications prior to visit.   No facility-administered medications prior to visit.     No Known Allergies  Social History   Tobacco Use   Smoking status: Never   Smokeless tobacco: Never   Tobacco comments:    black and mild 2x/week  Vaping Use   Vaping status: Never Used  Substance Use Topics   Alcohol use: Not Currently   Drug use: Never    Family History  Problem Relation Age of Onset   Healthy Mother    Diabetes Mother    Healthy Father    Diabetes Father    Breast cancer Maternal Grandmother    Prostate cancer Neg Hx    Colon cancer Neg Hx     Objective:  There were no vitals filed for this visit. There is no height or weight on file to calculate BMI.  Physical Exam  Lab Results: Lab Results  Component Value Date   WBC 6.1 10/17/2022   HGB 14.5 10/17/2022   HCT 42.6 10/17/2022   MCV 89.1 10/17/2022   PLT 225 10/17/2022    Lab Results  Component  Value Date   CREATININE 0.97 10/17/2022   BUN 5 (L) 10/17/2022   NA 138 10/17/2022   K 3.6 10/17/2022   CL 105 10/17/2022   CO2 25 10/17/2022    Lab Results  Component Value Date   ALT 21 10/17/2022   AST 17 10/17/2022   ALKPHOS 69 10/17/2022   BILITOT 1.0 10/17/2022     Assessment & Plan:   -F/U PRN  Danelle Earthly, MD [facility]   10/28/22  3:07 PM

## 2022-10-28 NOTE — Progress Notes (Signed)
New Patient Office Visit  Subjective:  Patient ID: Danny Mccoy, male    DOB: [birth year]  Age: 37 y.o. MRN: [id number]  CC:  Chief Complaint  Patient presents with   New Patient (Initial Visit)    HPI Danny Mccoy is a 37 y.o. male  has a past medical history of Anxiety, Asthma, Depression, ESBL (extended spectrum beta-lactamase) producing bacteria infection (10/16/2022), HSV-1 infection, and UTI (urinary tract infection) (10/15/2022).    Patient presents for hospital discharge follow-up for and to establish care for his chronic medical conditions.  Patient was on admission at the hospital from from 10/15/2022 through 10/24/2022 for ESBL E. coli UTI.  Prior to his hospitalization patient was treated with a course of Macrobid and fosfomycin but despite treatment patient had persistent symptoms of urinary ordered, increased frequency intermittent fever and flank pain. During admission Patient  completed 10-day course of IV antibiotics.  Today patient denies fever, urinary frequency, urinary odor, flank pain, hematuria.  He reports a little burning sensation with urination.  He has upcoming appointment with infectious disease specialist today.  He stated that urology will not see him because he has not paid his outstanding bills.  Patient declined flu vaccine and Tdap vaccine.  He was encouraged to consider getting both vaccines  Depression.  Stated that he was diagnosed with depression in 2015.  Stated that he was prescribed medication but he did not take the medication.  He does not know what is causing his depression.  He is currently homeless and sleeps in his car.  He starts a new job on Monday.  Patient denies SI, HI.   Patient refused blood work today.   Past Medical History:  Diagnosis Date   Anxiety    Asthma    Depression    ESBL (extended spectrum beta-lactamase) producing bacteria infection 10/16/2022   HSV-1 infection    UTI (urinary tract infection)  10/15/2022    Past Surgical History:  Procedure Laterality Date   FRACTURE SURGERY      Family History  Problem Relation Age of Onset   Healthy Mother    Diabetes Mother    Healthy Father    Diabetes Father    Breast cancer Maternal Grandmother    Prostate cancer Neg Hx    Colon cancer Neg Hx     Social History   Socioeconomic History   Marital status: Single    Spouse name: Not on file   Number of children: 1   Years of education: Not on file   Highest education level: Not on file  Occupational History   Not on file  Tobacco Use   Smoking status: Never   Smokeless tobacco: Never   Tobacco comments:    black and mild 2x/week  Vaping Use   Vaping status: Never Used  Substance and Sexual Activity   Alcohol use: Not Currently   Drug use: Never   Sexual activity: Yes  Other Topics Concern   Not on file  Social History Narrative   Homeless. Lives in the car.    Social Determinants of Health   Financial Resource Strain: Not on file  Food Insecurity: No Food Insecurity (10/19/2022)   Hunger Vital Sign    Worried About Running Out of Food in the Last Year: Never true    Ran Out of Food in the Last Year: Never true  Transportation Needs: No Transportation Needs (10/19/2022)   PRAPARE - Administrator, Civil Service (Medical): No  Lack of Transportation (Non-Medical): No  Physical Activity: Not on file  Stress: Not on file  Social Connections: Not on file  Intimate Partner Violence: Not At Risk (10/19/2022)   Humiliation, Afraid, Rape, and Kick questionnaire    Fear of Current or Ex-Partner: No    Emotionally Abused: No    Physically Abused: No    Sexually Abused: No    ROS Review of Systems  Constitutional: Negative.   HENT: Negative.    Eyes: Negative.   Respiratory: Negative.    Cardiovascular: Negative.  Negative for chest pain, palpitations and leg swelling.  Gastrointestinal: Negative.   Endocrine: Negative.   Genitourinary: Negative.   Negative for difficulty urinating, flank pain, frequency, genital sores and hematuria.  Musculoskeletal:  Positive for arthralgias.  Skin: Negative.   Allergic/Immunologic: Negative.   Neurological: Negative.   Psychiatric/Behavioral: Negative.  Negative for confusion, self-injury and suicidal ideas.     Objective:   Today's Vitals: BP 125/83   Pulse 62   Temp 98.4 F (36.9 C) (Oral)   Ht 5\' 11"  (1.803 m)   Wt 215 lb (97.5 kg)   SpO2 99%   BMI 29.99 kg/m   Physical Exam Vitals and nursing note reviewed.  Constitutional:      General: He is not in acute distress.    Appearance: Normal appearance. He is not ill-appearing, toxic-appearing or diaphoretic.  HENT:     Mouth/Throat:     Mouth: Mucous membranes are moist.     Pharynx: Oropharynx is clear. No oropharyngeal exudate or posterior oropharyngeal erythema.  Eyes:     General: No scleral icterus.       Right eye: No discharge.        Left eye: No discharge.     Extraocular Movements: Extraocular movements intact.     Conjunctiva/sclera: Conjunctivae normal.  Cardiovascular:     Rate and Rhythm: Normal rate and regular rhythm.     Pulses: Normal pulses.     Heart sounds: Normal heart sounds. No murmur heard.    No friction rub. No gallop.  Pulmonary:     Effort: Pulmonary effort is normal. No respiratory distress.     Breath sounds: Normal breath sounds. No stridor. No wheezing, rhonchi or rales.  Chest:     Chest wall: No tenderness.  Abdominal:     General: There is no distension.     Palpations: Abdomen is soft.     Tenderness: There is no abdominal tenderness. There is no right CVA tenderness, left CVA tenderness or guarding.  Musculoskeletal:        General: No swelling, tenderness, deformity or signs of injury.     Right lower leg: No edema.     Left lower leg: No edema.  Skin:    General: Skin is warm and dry.     Capillary Refill: Capillary refill takes less than 2 seconds.     Coloration: Skin is not  jaundiced or pale.     Findings: No bruising, erythema or lesion.  Neurological:     Mental Status: He is alert and oriented to person, place, and time.     Motor: No weakness.     Coordination: Coordination normal.     Gait: Gait normal.  Psychiatric:        Mood and Affect: Mood normal.        Behavior: Behavior normal.        Thought Content: Thought content normal.  Judgment: Judgment normal.     Assessment & Plan:   Problem List Items Addressed This Visit       Cardiovascular and Mediastinum   Incomplete right bundle branch block    Currently denies chest pain, palpitations, sob , sycope Patient encouraged to follow-up with cardiology if he develops any chest pain, palpitations Phone number and address to the cardiology office provided        Other   ESBL (extended spectrum beta-lactamase) producing bacteria infection - Primary    Patient has upcoming appointment with infectious disease specialist today Was encouraged to follow-up with urology as well Follow-up in the office as needed if his urinary symptoms reoccurs Denies fever, abdominal pain, flank pain, urinary odor , cloudy urine ,hematuria      Hospital discharge follow-up    Hospital discharge summary labs and imaging studies reviewed      Current moderate episode of major depressive disorder (HCC)    Flowsheet Row Office Visit from 10/28/2022 in [facility]  PHQ-9 Total Score 13     Patient denies SI, HI Declined medication and counseling Information for [facility] behavioral health clinic provided, patient advised to follow-up with them if needed.        Homelessness    Currently sleeps him in his car List of shelters around this area provided Patient states that he starts a new job on Monday Food from the pantry provided to the patient.      Bilateral hand pain    Currently does not have pain in his hands But has intermittent pain when working with his  hands Patient encouraged to take Tylenol as needed application of heat or ice also encouraged      Hypocalcemia    Lab Results  Component Value Date   NA 138 10/17/2022   K 3.6 10/17/2022   CO2 25 10/17/2022   GLUCOSE 104 (H) 10/17/2022   BUN 5 (L) 10/17/2022   CREATININE 0.97 10/17/2022   CALCIUM 8.3 (L) 10/17/2022   GFRNONAA >60 10/17/2022  Patient encouraged to increase intake of foods rich in calcium      Proteinuria    Will have the patient follow-up in the office in 1 month for  his persistent proteinuria.  Will also recheck his CMP, UA, urine microalbumin and  consider nephrology referral       No outpatient encounter medications on file as of 10/28/2022.   No facility-administered encounter medications on file as of 10/28/2022.    Follow-up: Return in about 1 month (around 11/27/2022) for UTI, Proteiunuria.   Donell Beers, FNP

## 2022-10-30 ENCOUNTER — Ambulatory Visit: Payer: Medicaid Other | Attending: Cardiology | Admitting: Cardiology

## 2022-10-30 ENCOUNTER — Encounter: Payer: Self-pay | Admitting: Cardiology

## 2022-10-30 VITALS — BP 100/60 | HR 74 | Ht 71.0 in | Wt 215.2 lb

## 2022-10-30 DIAGNOSIS — R55 Syncope and collapse: Secondary | ICD-10-CM | POA: Diagnosis not present

## 2022-10-30 DIAGNOSIS — R079 Chest pain, unspecified: Secondary | ICD-10-CM | POA: Diagnosis not present

## 2022-10-30 MED ORDER — METOPROLOL TARTRATE 50 MG PO TABS: 50.0000 mg | ORAL_TABLET | ORAL | 0 refills | Status: DC

## 2022-10-30 NOTE — Patient Instructions (Signed)
Medication Instructions:  The current medical regimen is effective;  continue present plan and medications.  *If you need a refill on your cardiac medications before your next appointment, please call your pharmacy*   Lab Work: None today If you have labs (blood work) drawn today and your tests are completely normal, you will receive your results only by: MyChart Message (if you have MyChart) OR A paper copy in the mail If you have any lab test that is abnormal or we need to change your treatment, we will call you to review the results.   Testing/Procedures:   Your cardiac CT will be scheduled at:   [facility] [address] [place], [place] [address] [phone]  Please arrive at the [facility] Entrance (Entrance C2) of [facility] 30 minutes prior to test start time. You can use the FREE valet parking offered at entrance C (encouraged to control the heart rate for the test)  Proceed to the [facility] Radiology Department (first floor) to check-in and test prep.  All radiology patients and guests should use entrance C2 at [facility], accessed from [facility], even though the hospital's physical address listed is [address]    Please follow these instructions carefully (unless otherwise directed):  An IV will be required for this test and Nitroglycerin will be given.  Hold all erectile dysfunction medications at least 3 days (72 hrs) prior to test. (Ie viagra, cialis, sildenafil, tadalafil, etc)   On the Night Before the Test: Be sure to Drink plenty of water. Do not consume any caffeinated/decaffeinated beverages or chocolate 12 hours prior to your test. Do not take any antihistamines 12 hours prior to your test.  On the Day of the Test: Drink plenty of water until 1 hour prior to the test. Do not eat any food 1 hour prior to test. You may take your regular medications prior to the test.  Take  metoprolol (Lopressor) two hours prior to test. If you take Furosemide/Hydrochlorothiazide/Spironolactone, please HOLD on the morning of the test.      After the Test: Drink plenty of water. After receiving IV contrast, you may experience a mild flushed feeling. This is normal. On occasion, you may experience a mild rash up to 24 hours after the test. This is not dangerous. If this occurs, you can take Benadryl 25 mg and increase your fluid intake. If you experience trouble breathing, this can be serious. If it is severe call 911 IMMEDIATELY. If it is mild, please call our office. If you take any of these medications: Glipizide/Metformin, Avandament, Glucavance, please do not take 48 hours after completing test unless otherwise instructed.  We will call to schedule your test 2-4 weeks out understanding that some insurance companies will need an authorization prior to the service being performed.   For more information and frequently asked questions, please visit our website : [email or web address]  For non-scheduling related questions, please contact the cardiac imaging nurse navigator should you have any questions/concerns: Cardiac Imaging Nurse Navigators Direct Office Dial: [phone]   For scheduling needs, including cancellations and rescheduling, please call [place], [phone].   Follow-Up: At [facility], you and your health needs are our priority.  As part of our continuing mission to provide you with exceptional heart care, we have created designated Provider Care Teams.  These Care Teams include your primary Cardiologist (physician) and Advanced Practice Providers (APPs -  Physician Assistants and Nurse Practitioners) who all  work together to provide you with the care you need, when you need it.  We recommend signing up for the patient portal called "MyChart".  Sign up information is provided on this After Visit Summary.  MyChart is used to connect with  patients for Virtual Visits (Telemedicine).  Patients are able to view lab/test results, encounter notes, upcoming appointments, etc.  Non-urgent messages can be sent to your provider as well.   To learn more about what you can do with MyChart, go to [email or web address].    Your next appointment:   Follow up will be based on results of the above testing.

## 2022-10-30 NOTE — Progress Notes (Signed)
Cardiology Office Note:  .   Date:  10/30/2022  ID:  Danny Mccoy, DOB [birth year], MRN [id number] PCP: Donell Beers, FNP  [facility] Providers Cardiologist:  None    History of Present Illness: .   Danny Mccoy is a 37 y.o. male  Discussed with the use of AI scribe software  History of Present Illness   Danny Mccoy, a patient with a history of syncope in 2022, presents for evaluation of chest pain. The patient reports a fainting episode at work in 2022, which led to a 10-day hospitalization with continuous heart monitoring. Despite being given a heart monitor to use at home, the patient admits to not using it. Recently, during a job interview, an EKG was performed, and the patient was informed of potential heart abnormalities. The patient reports feeling "very sluggish" some days and experiences shortness of breath, particularly upon waking and during physical activity. The patient also reports occasional chest tightness during exercise, but had previously attributed this to normal exertion. The patient denies any history of smoking and reports a family history of diabetes but not heart disease.        Studies Reviewed: Marland Kitchen   EKG Interpretation Date/Time:  Friday October 30 2022 13:16:58 EDT Ventricular Rate:  70 PR Interval:  178 QRS Duration:  106 QT Interval:  392 QTC Calculation: 423 R Axis:   43  Text Interpretation: Normal sinus rhythm Minimal voltage criteria for LVH, may be normal variant ( Sokolow-Lyon ) ST & T wave abnormality, consider inferior ischemia When compared with ECG of 15-Oct-2022 14:52, Left anterior fascicular block is no longer Present Incomplete right bundle branch block is no longer Present Criteria for Septal infarct are no longer Present Confirmed by Donato Schultz ([id number]) on 10/30/2022 1:20:47 PM    Cardiac Studies & Procedures       ECHOCARDIOGRAM  ECHOCARDIOGRAM COMPLETE 11/06/2020  Narrative ECHOCARDIOGRAM  REPORT    Patient Name:   Danny Mccoy Date of Exam: 11/06/2020 Medical Rec #:  [id number]       Height:       72.0 in Accession #:    [id number]      Weight:       199.0 lb Date of Birth:  21-Sep-1985       BSA:          2.126 m Patient Age:    35 years        BP:           138/87 mmHg Patient Gender: M               HR:           57 bpm. Exam Location:  Inpatient  Procedure: 2D Echo, Cardiac Doppler and Color Doppler  Indications:    Syncope  History:        Patient has no prior history of Echocardiogram examinations. Elevated troponin.  Sonographer:    Roosvelt Maser RDCS Referring Phys: [id number] CAROLE N HALL  IMPRESSIONS   1. Left ventricular ejection fraction, by estimation, is 60 to 65%. The left ventricle has normal function. The left ventricle has no regional wall motion abnormalities. Left ventricular diastolic parameters were normal. 2. Right ventricular systolic function is normal. The right ventricular size is normal. 3. The mitral valve is abnormal. Mild mitral valve regurgitation. No evidence of mitral stenosis. 4. The aortic valve is tricuspid. Aortic valve regurgitation is not visualized. No aortic stenosis is present. 5. The inferior vena  cava is normal in size with greater than 50% respiratory variability, suggesting right atrial pressure of 3 mmHg.  FINDINGS Left Ventricle: Left ventricular ejection fraction, by estimation, is 60 to 65%. The left ventricle has normal function. The left ventricle has no regional wall motion abnormalities. The left ventricular internal cavity size was normal in size. There is no left ventricular hypertrophy. Left ventricular diastolic parameters were normal.  Right Ventricle: The right ventricular size is normal. No increase in right ventricular wall thickness. Right ventricular systolic function is normal.  Left Atrium: Left atrial size was normal in size.  Right Atrium: Right atrial size was normal in size.  Pericardium: There  is no evidence of pericardial effusion.  Mitral Valve: The mitral valve is abnormal. There is mild thickening of the mitral valve leaflet(s). Mild mitral valve regurgitation. No evidence of mitral valve stenosis.  Tricuspid Valve: The tricuspid valve is normal in structure. Tricuspid valve regurgitation is not demonstrated. No evidence of tricuspid stenosis.  Aortic Valve: The aortic valve is tricuspid. Aortic valve regurgitation is not visualized. No aortic stenosis is present.  Pulmonic Valve: The pulmonic valve was normal in structure. Pulmonic valve regurgitation is trivial. No evidence of pulmonic stenosis.  Aorta: The aortic root is normal in size and structure.  Venous: The inferior vena cava is normal in size with greater than 50% respiratory variability, suggesting right atrial pressure of 3 mmHg.  IAS/Shunts: No atrial level shunt detected by color flow Doppler.   LEFT VENTRICLE PLAX 2D LVIDd:         5.20 cm  Diastology LVIDs:         3.60 cm  LV e' medial:    10.70 cm/s LV PW:         1.00 cm  LV E/e' medial:  9.4 LV IVS:        1.00 cm  LV e' lateral:   16.20 cm/s LVOT diam:     2.10 cm  LV E/e' lateral: 6.2 LVOT Area:     3.46 cm   RIGHT VENTRICLE RV Basal diam:  3.70 cm  LEFT ATRIUM             Index       RIGHT ATRIUM           Index LA diam:        3.80 cm 1.79 cm/m  RA Area:     20.70 cm LA Vol (A2C):   74.4 ml 34.99 ml/m RA Volume:   68.70 ml  32.31 ml/m LA Vol (A4C):   63.2 ml 29.73 ml/m LA Biplane Vol: 68.8 ml 32.36 ml/m  AORTA Ao Root diam: 3.00 cm  MITRAL VALVE MV Area (PHT): 4.39 cm     SHUNTS MV Decel Time: 173 msec     Systemic Diam: 2.10 cm MV E velocity: 101.00 cm/s MV A velocity: 49.80 cm/s MV E/A ratio:  2.03  Charlton Haws MD Electronically signed by Charlton Haws MD Signature Date/Time: 11/06/2020/5:41:00 PM    Final              Risk Assessment/Calculations:            Physical Exam:   VS:  BP 100/60   Pulse 74   Ht  5\' 11"  (1.803 m)   Wt 215 lb 3.2 oz (97.6 kg)   SpO2 96%   BMI 30.01 kg/m    Wt Readings from Last 3 Encounters:  10/30/22 215 lb 3.2 oz (97.6 kg)  10/28/22  216 lb 4.8 oz (98.1 kg)  10/28/22 215 lb (97.5 kg)    GEN: Well nourished, well developed in no acute distress NECK: No JVD; No carotid bruits CARDIAC: RRR, no murmurs, rubs, gallops RESPIRATORY:  Clear to auscultation without rales, wheezing or rhonchi  ABDOMEN: Soft, non-tender, non-distended EXTREMITIES:  No edema; No deformity   ASSESSMENT AND PLAN: .    Assessment and Plan    Right Bundle Branch Block/chest pain Noted on prior EKG.  Previous syncopal episode in 2022. Echocardiogram in 2022 showed normal pump function. -Schedule coronary CT scan to assess for any blockages or problems with blood flow.  Fatigue Reports feeling sluggish and tired on some days. No other associated symptoms discussed. -Consider further evaluation depending on the results of the coronary CT scan.  Follow-up as needed after the coronary CT scan.             Signed, Donato Schultz, MD

## 2022-10-31 ENCOUNTER — Encounter (HOSPITAL_COMMUNITY): Payer: Self-pay

## 2022-10-31 ENCOUNTER — Ambulatory Visit (HOSPITAL_COMMUNITY)
Admission: EM | Admit: 2022-10-31 | Discharge: 2022-10-31 | Disposition: A | Discharge status: Home / Self Care | Payer: Medicaid Other | Attending: Emergency Medicine | Admitting: Emergency Medicine

## 2022-10-31 DIAGNOSIS — J029 Acute pharyngitis, unspecified: Secondary | ICD-10-CM

## 2022-10-31 MED ORDER — LIDOCAINE VISCOUS HCL 2 % MT SOLN: 15.0000 mL | OROMUCOSAL | 0 refills | Status: DC | PRN

## 2022-10-31 NOTE — ED Triage Notes (Signed)
Pt presents with sore throat x 2 days. No other symptoms. Pt does not want a strep test.

## 2022-10-31 NOTE — ED Provider Notes (Signed)
[facility]    CSN: [id number] Arrival date & time: 10/31/22  1655     History   Chief Complaint Chief Complaint  Patient presents with   Sore Throat   HPI Danny Mccoy is a 37 y.o. male.  3 day history of sore throat. Rates 4/10 discomfort. A little scratchy. Some dry cough occasionally. No fevers. No nausea/vomiting or abdominal pain. No rash.  Has taken 800 mg ibuprofen twice daily  He reports oral sex earlier in the week. Denies known exposure.  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 Authorizing Provider  lidocaine (XYLOCAINE) 2 % solution Use as directed 15 mLs in the mouth or throat every 3 (three) hours as needed for mouth pain. Swish/gargle and spit out 10/31/22  Yes Terree Gaultney, Lurena Joiner, PA-C  metoprolol tartrate (LOPRESSOR) 50 MG tablet Take 1 tablet (50 mg total) by mouth as directed. Take (1) tablet two hours before CT scan 10/30/22   Jake Bathe, MD    Family  History Family History  Problem Relation Age of Onset   Healthy Mother    Diabetes Mother    Healthy Father    Diabetes Father    Breast cancer Maternal Grandmother    Prostate cancer Neg Hx    Colon cancer Neg Hx     Social History Social History   Tobacco Use   Smoking status: Never   Smokeless tobacco: Never   Tobacco comments:    black and mild 2x/week  Vaping Use   Vaping status: Never Used  Substance Use Topics   Alcohol use: Not Currently    Alcohol/week: 1.0 standard drink of alcohol    Types: 1 Cans of beer per week   Drug use: Never     Allergies   Patient has no known allergies.   Review of Systems Review of Systems As per HPI  Physical Exam Triage Vital Signs ED Triage Vitals  Encounter Vitals Group     BP 10/31/22 1744 115/75     Systolic BP Percentile --      Diastolic BP Percentile --      Pulse Rate 10/31/22 1743 83     Resp 10/31/22 1743 16     Temp 10/31/22 1743 98.5 F (36.9 C)     Temp Source 10/31/22 1743 Oral     SpO2 10/31/22 1743 95 %     Weight --      Height --  Head Circumference --      Peak Flow --      Pain Score --      Pain Loc --      Pain Education --      Exclude from Growth Chart --    No data found.  Updated Vital Signs BP 115/75 (BP Location: Left Arm)   Pulse 83   Temp 98.5 F (36.9 C) (Oral)   Resp 16   SpO2 95%   Physical Exam Vitals and nursing note reviewed.  Constitutional:      General: He is not in acute distress.    Appearance: He is not ill-appearing.  HENT:     Right Ear: Tympanic membrane and ear canal normal.     Left Ear: Tympanic membrane and ear canal normal.     Nose: No congestion or rhinorrhea.     Mouth/Throat:     Mouth: Mucous membranes are moist. No oral lesions.     Pharynx: Oropharynx is clear. Uvula midline. No pharyngeal swelling, oropharyngeal exudate or uvula swelling.     Comments: Very slight streak of erythema on the palatoglossal arch. There is no tonsillar  hypertrophy, exudate, uvula swelling. Normal phonation, tolerating secretions  Eyes:     Conjunctiva/sclera: Conjunctivae normal.  Cardiovascular:     Rate and Rhythm: Normal rate and regular rhythm.     Heart sounds: Normal heart sounds.  Pulmonary:     Effort: Pulmonary effort is normal.     Breath sounds: Normal breath sounds.  Musculoskeletal:     Cervical back: Normal range of motion.  Lymphadenopathy:     Cervical: No cervical adenopathy.  Skin:    General: Skin is warm and dry.  Neurological:     Mental Status: He is alert and oriented to person, place, and time.     UC Treatments / Results  Labs (all labs ordered are listed, but only abnormal results are displayed) Labs Reviewed - No data to display  EKG   Radiology No results found.  Procedures Procedures (including critical care time)  Medications Ordered in UC Medications - No data to display  Initial Impression / Assessment and Plan / UC Course  I have reviewed the triage vital signs and the nursing notes.  Pertinent labs & imaging results that were available during my care of the patient were reviewed by me and considered in my medical decision making (see chart for details).  Afebrile and well appearing. There is slight erythema but very mild.  He does not want a strep test. He also declines oral STD testing. Tests are uncomfortable and cause gag reflex.   He seems frustrated with the plan to treat symptomatically given the well appearing throat and lack of exudate, fever, or LAD. I recommended to use ibuprofen alternated with tylenol, and to try lidocaine gargle and spit for topical numbing, throat lozenge or spray if helpful. I have low concern for bacterial infection at this time, but discussed with patient cannot confirm this without testing. He is very well appearing at this time.   Patient did not want discharge paperwork from me Sent lidocaine to pharmacy on file, advised to give it a try and  continue other symptomatic care  Final Clinical Impressions(s) / UC Diagnoses   Final diagnoses:  Sore throat   Discharge Instructions   None    ED Prescriptions     Medication Sig Dispense Auth. Provider   lidocaine (XYLOCAINE) 2 % solution Use as directed 15 mLs  in the mouth or throat every 3 (three) hours as needed for mouth pain. Swish/gargle and spit out 100 mL Alwin Lanigan, Lurena Joiner, PA-C      PDMP not reviewed this encounter.   Kathrine Haddock 10/31/22 1904

## 2022-11-05 ENCOUNTER — Encounter (HOSPITAL_COMMUNITY): Payer: Self-pay

## 2022-11-05 ENCOUNTER — Ambulatory Visit (HOSPITAL_COMMUNITY)
Admission: EM | Admit: 2022-11-05 | Discharge: 2022-11-05 | Disposition: A | Discharge status: Home / Self Care | Payer: Medicaid Other | Attending: Family Medicine | Admitting: Family Medicine

## 2022-11-05 DIAGNOSIS — J0110 Acute frontal sinusitis, unspecified: Secondary | ICD-10-CM | POA: Diagnosis present

## 2022-11-05 DIAGNOSIS — Z1152 Encounter for screening for COVID-19: Secondary | ICD-10-CM | POA: Insufficient documentation

## 2022-11-05 LAB — SARS CORONAVIRUS 2 (TAT 6-24 HRS): SARS Coronavirus 2: NEGATIVE

## 2022-11-05 MED ORDER — AMOXICILLIN-POT CLAVULANATE 875-125 MG PO TABS: 1.0000 | ORAL_TABLET | Freq: Two times a day (BID) | ORAL | 0 refills | Status: DC

## 2022-11-05 NOTE — Discharge Instructions (Signed)
You were seen today for upper respiratory symptoms.  I have sent out an antibiotic for possible sinus infection.  I recommend you also take over the counter claritin/zyrtec, and flonase nasal spray.  Your covid swab will be resulted tomorrow, although you are outside the window for treatment with an ant-viral.  Please get plenty of rest and fluids.  Return if not improving.

## 2022-11-05 NOTE — ED Triage Notes (Signed)
Pt c/o nasal congestion and cough x1wk. States taking OTC meds with no relief. States needs a work note.

## 2022-11-05 NOTE — ED Provider Notes (Signed)
[facility]    CSN: [id number] Arrival date & time: 11/05/22  0802      History   Chief Complaint Chief Complaint  Patient presents with   Nasal Congestion    HPI Danny Mccoy is a 37 y.o. male.   Patient is here with URI symptoms.  Started as a sore throat last week, taking motrin/nyquil.  Then with runny nose, congestion, drainage.  +fatigue, Mild cough.  Some sob.  Some sinus pain/pressure.  No known fevers/chills.  He is taking motrin daily.  He is requesting a covid test and a note for work as well.       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 Authorizing Provider  lidocaine (XYLOCAINE) 2 % solution Use as directed 15 mLs in the mouth or throat every 3 (three) hours as needed for mouth pain. Swish/gargle and spit out 10/31/22   Rising, Lurena Joiner, PA-C  metoprolol tartrate (LOPRESSOR) 50 MG tablet Take 1 tablet (50 mg total) by mouth as directed. Take (1) tablet two hours before CT scan 10/30/22   Jake Bathe, MD    Family History Family History   Problem Relation Age of Onset   Healthy Mother    Diabetes Mother    Healthy Father    Diabetes Father    Breast cancer Maternal Grandmother    Prostate cancer Neg Hx    Colon cancer Neg Hx     Social History Social History   Tobacco Use   Smoking status: Never   Smokeless tobacco: Never   Tobacco comments:    black and mild 2x/week  Vaping Use   Vaping status: Never Used  Substance Use Topics   Alcohol use: Not Currently    Alcohol/week: 1.0 standard drink of alcohol    Types: 1 Cans of beer per week   Drug use: Never     Allergies   Patient has no known allergies.   Review of Systems Review of Systems  Constitutional:  Positive for fatigue.  HENT:  Positive for congestion, rhinorrhea, sinus pain and sore throat.   Respiratory:  Positive for shortness of breath. Negative for cough.   Cardiovascular: Negative.   Gastrointestinal: Negative.   Musculoskeletal: Negative.   Psychiatric/Behavioral: Negative.       Physical Exam Triage Vital Signs ED Triage Vitals [11/05/22 0833]  Encounter Vitals Group     BP 121/84     Systolic BP Percentile      Diastolic BP Percentile      Pulse Rate 77     Resp  18     Temp 98.2 F (36.8 C)     Temp Source Oral     SpO2 95 %     Weight      Height      Head Circumference      Peak Flow      Pain Score 0     Pain Loc      Pain Education      Exclude from Growth Chart    No data found.  Updated Vital Signs BP 121/84 (BP Location: Left Arm)   Pulse 77   Temp 98.2 F (36.8 C) (Oral)   Resp 18   SpO2 95%   Visual Acuity Right Eye Distance:   Left Eye Distance:   Bilateral Distance:    Right Eye Near:   Left Eye Near:    Bilateral Near:     Physical Exam Constitutional:      Appearance: Normal appearance.  HENT:     Right Ear: Tympanic membrane normal.     Left Ear: Tympanic membrane normal.     Nose: Congestion and rhinorrhea present.     Right Sinus: Maxillary sinus tenderness present.     Left  Sinus: Maxillary sinus tenderness present.     Mouth/Throat:     Pharynx: Posterior oropharyngeal erythema present. No oropharyngeal exudate.  Cardiovascular:     Rate and Rhythm: Normal rate and regular rhythm.  Pulmonary:     Effort: Pulmonary effort is normal.     Breath sounds: Normal breath sounds.  Musculoskeletal:     Cervical back: Normal range of motion and neck supple.  Skin:    General: Skin is warm.  Neurological:     General: No focal deficit present.     Mental Status: He is alert.  Psychiatric:        Mood and Affect: Mood normal.      UC Treatments / Results  Labs (all labs ordered are listed, but only abnormal results are displayed) Labs Reviewed  SARS CORONAVIRUS 2 (TAT 6-24 HRS)    EKG   Radiology No results found.  Procedures Procedures (including critical care time)  Medications Ordered in UC Medications - No data to display  Initial Impression / Assessment and Plan / UC Course  I have reviewed the triage vital signs and the nursing notes.  Pertinent labs & imaging results that were available during my care of the patient were reviewed by me and considered in my medical decision making (see chart for details).   Final Clinical Impressions(s) / UC Diagnoses   Final diagnoses:  Acute non-recurrent frontal sinusitis  Encounter for screening for COVID-19     Discharge Instructions      You were seen today for upper respiratory symptoms.  I have sent out an antibiotic for possible sinus infection.  I recommend you also take over the counter claritin/zyrtec, and flonase nasal spray.  Your covid swab will be resulted tomorrow, although you are outside the window for treatment with an ant-viral.  Please get plenty of rest and fluids.  Return if not improving.     ED Prescriptions     Medication Sig Dispense Auth. Provider   amoxicillin-clavulanate (AUGMENTIN) 875-125 MG tablet Take 1 tablet by mouth every 12 (twelve) hours. 14 tablet  Jannifer Franklin, MD      PDMP not reviewed this encounter.   Jannifer Franklin, MD 11/05/22 [phone]

## 2022-11-17 ENCOUNTER — Encounter (HOSPITAL_COMMUNITY): Payer: Self-pay | Admitting: *Deleted

## 2022-11-17 ENCOUNTER — Ambulatory Visit (HOSPITAL_COMMUNITY)
Admission: EM | Admit: 2022-11-17 | Discharge: 2022-11-17 | Disposition: A | Discharge status: Home / Self Care | Payer: Medicaid Other | Attending: Emergency Medicine | Admitting: Emergency Medicine

## 2022-11-17 DIAGNOSIS — R052 Subacute cough: Secondary | ICD-10-CM | POA: Diagnosis not present

## 2022-11-17 DIAGNOSIS — R0989 Other specified symptoms and signs involving the circulatory and respiratory systems: Secondary | ICD-10-CM | POA: Diagnosis not present

## 2022-11-17 MED ORDER — BENZONATATE 100 MG PO CAPS: 100.0000 mg | ORAL_CAPSULE | Freq: Three times a day (TID) | ORAL | 0 refills | Status: DC

## 2022-11-17 MED ORDER — GUAIFENESIN ER 600 MG PO TB12: 600.0000 mg | ORAL_TABLET | Freq: Two times a day (BID) | ORAL | 0 refills | Status: DC

## 2022-11-17 MED ORDER — PROMETHAZINE-DM 6.25-15 MG/5ML PO SYRP: 5.0000 mL | ORAL_SOLUTION | Freq: Every evening | ORAL | 0 refills | Status: DC | PRN

## 2022-11-17 NOTE — ED Provider Notes (Signed)
[facility]    CSN: [id number] Arrival date & time: 11/17/22  1223      History   Chief Complaint Chief Complaint  Patient presents with   Cough   Nasal Congestion    HPI Danny Mccoy is a 37 y.o. male.   Patient presents with persistent cough and congestion x 2 weeks.  Patient reports taking NyQuil and Robitussin with minimal relief.  Patient was treated with Augmentin for acute sinusitis on 9/26.  Denies shortness of breath, chest pain, fever, headache, dizziness, and weakness.   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 Authorizing Provider  benzonatate (TESSALON) 100 MG capsule Take 1 capsule (100 mg total) by mouth every 8 (eight) hours. 11/17/22  Yes Susann Givens, Joseh Sjogren A, NP  guaiFENesin (MUCINEX) 600 MG 12 hr tablet Take 1 tablet (600 mg total) by mouth 2 (two) times daily. 11/17/22  Yes  Susann Givens, Dquan Cortopassi A, NP  promethazine-dextromethorphan (PROMETHAZINE-DM) 6.25-15 MG/5ML syrup Take 5 mLs by mouth at bedtime as needed for cough. 11/17/22  Yes Susann Givens, Anne Boltz A, NP  metoprolol tartrate (LOPRESSOR) 50 MG tablet Take 1 tablet (50 mg total) by mouth as directed. Take (1) tablet two hours before CT scan 10/30/22   Jake Bathe, MD    Family History Family History  Problem Relation Age of Onset   Healthy Mother    Diabetes Mother    Healthy Father    Diabetes Father    Breast cancer Maternal Grandmother    Prostate cancer Neg Hx    Colon cancer Neg Hx     Social History Social History   Tobacco Use   Smoking status: Never   Smokeless tobacco: Never   Tobacco comments:    black and mild 2x/week  Vaping Use   Vaping status: Never Used  Substance Use Topics   Alcohol use: Not Currently    Alcohol/week: 1.0 standard drink of alcohol    Types: 1 Cans of beer per week   Drug use: Never     Allergies   Patient has no known allergies.   Review of Systems Review of Systems  Constitutional:  Negative for chills, fatigue and fever.  HENT:  Positive for congestion and rhinorrhea. Negative for sinus pressure, sinus pain and sore throat.   Respiratory:  Positive for cough. Negative for chest tightness, shortness of breath and wheezing.  Cardiovascular:  Negative for chest pain.  Gastrointestinal:  Negative for nausea and vomiting.  Neurological:  Negative for dizziness, weakness and headaches.     Physical Exam Triage Vital Signs ED Triage Vitals  Encounter Vitals Group     BP 11/17/22 1314 118/77     Systolic BP Percentile --      Diastolic BP Percentile --      Pulse Rate 11/17/22 1314 (!) 57     Resp 11/17/22 1314 18     Temp 11/17/22 1314 98.6 F (37 C)     Temp Source 11/17/22 1314 Oral     SpO2 11/17/22 1314 97 %     Weight --      Height --      Head Circumference --      Peak Flow --      Pain Score 11/17/22 1313 0     Pain Loc --      Pain  Education --      Exclude from Growth Chart --    No data found.  Updated Vital Signs BP 118/77 (BP Location: Left Arm)   Pulse (!) 57   Temp 98.6 F (37 C) (Oral)   Resp 18   SpO2 97%   Visual Acuity Right Eye Distance:   Left Eye Distance:   Bilateral Distance:    Right Eye Near:   Left Eye Near:    Bilateral Near:     Physical Exam Vitals and nursing note reviewed.  Constitutional:      General: He is awake. He is not in acute distress.    Appearance: Normal appearance. He is well-developed and well-groomed. He is not ill-appearing, toxic-appearing or diaphoretic.  HENT:     Right Ear: Tympanic membrane, ear canal and external ear normal.     Left Ear: Tympanic membrane, ear canal and external ear normal.     Nose: Congestion and rhinorrhea present.     Right Sinus: No maxillary sinus tenderness or frontal sinus tenderness.     Left Sinus: No maxillary sinus tenderness or frontal sinus tenderness.     Mouth/Throat:     Mouth: Mucous membranes are moist.     Pharynx: Posterior oropharyngeal erythema and postnasal drip present. No pharyngeal swelling or oropharyngeal exudate.     Tonsils: No tonsillar exudate.  Cardiovascular:     Rate and Rhythm: Normal rate.     Heart sounds: Normal heart sounds.  Pulmonary:     Effort: Pulmonary effort is normal.     Breath sounds: Normal breath sounds.  Skin:    General: Skin is warm and dry.  Neurological:     Mental Status: He is alert.  Psychiatric:        Behavior: Behavior is cooperative.      UC Treatments / Results  Labs (all labs ordered are listed, but only abnormal results are displayed) Labs Reviewed - No data to display  EKG   Radiology No results found.  Procedures Procedures (including critical care time)  Medications Ordered in UC Medications - No data to display  Initial Impression / Assessment and Plan / UC Course  I have reviewed the triage vital signs and the nursing notes.  Pertinent  labs & imaging results that were available during my care of the patient were reviewed by me and considered in my medical decision making (see chart for details).     Patient presented with 2-week history of cough and congestion.  Patient was recently treated  for acute sinusitis with Augmentin.  Upon assessment patient has mild erythema noted to oropharynx, congestion and rhinorrhea present.  Patient is dry but nonproductive cough.  Prescribed Tessalon and promethazine as needed for cough and Mucinex to help break up mucus.  Discussed return and follow-up precautions. Final Clinical Impressions(s) / UC Diagnoses   Final diagnoses:  Subacute cough  Chest congestion     Discharge Instructions      You can take Tessalon every 8 hours as needed for cough, promethazine cough syrup at night to help with cough that keeps you up at night, and take Mucinex twice daily to help break up congestion.  If your symptoms persist return here for reevaluation.    ED Prescriptions     Medication Sig Dispense Auth. Provider   promethazine-dextromethorphan (PROMETHAZINE-DM) 6.25-15 MG/5ML syrup Take 5 mLs by mouth at bedtime as needed for cough. 118 mL Susann Givens, Kiko Ripp A, NP   benzonatate (TESSALON) 100 MG capsule Take 1 capsule (100 mg total) by mouth every 8 (eight) hours. 21 capsule Susann Givens, Charlott Calvario A, NP   guaiFENesin (MUCINEX) 600 MG 12 hr tablet Take 1 tablet (600 mg total) by mouth 2 (two) times daily. 20 tablet Wynonia Lawman A, NP      PDMP not reviewed this encounter.   Wynonia Lawman A, NP 11/17/22 1335

## 2022-11-17 NOTE — Discharge Instructions (Signed)
You can take Tessalon every 8 hours as needed for cough, promethazine cough syrup at night to help with cough that keeps you up at night, and take Mucinex twice daily to help break up congestion.  If your symptoms persist return here for reevaluation.

## 2022-11-17 NOTE — ED Triage Notes (Signed)
Pt states he has had a cough and congestion x 2 weeks. He has been taking Robitussin OTC

## 2022-11-27 ENCOUNTER — Ambulatory Visit: Payer: Self-pay | Admitting: Nurse Practitioner

## 2022-12-03 ENCOUNTER — Ambulatory Visit (HOSPITAL_COMMUNITY)
Admission: EM | Admit: 2022-12-03 | Discharge: 2022-12-03 | Disposition: A | Discharge status: Home / Self Care | Payer: Medicaid Other | Attending: Physician Assistant | Admitting: Physician Assistant

## 2022-12-03 ENCOUNTER — Encounter (HOSPITAL_COMMUNITY): Payer: Self-pay

## 2022-12-03 DIAGNOSIS — J039 Acute tonsillitis, unspecified: Secondary | ICD-10-CM | POA: Diagnosis not present

## 2022-12-03 HISTORY — DX: Essential (primary) hypertension: I10

## 2022-12-03 MED ORDER — AMOXICILLIN 500 MG PO CAPS: 500.0000 mg | ORAL_CAPSULE | Freq: Two times a day (BID) | ORAL | 0 refills | Status: DC

## 2022-12-03 MED ORDER — IBUPROFEN 800 MG PO TABS: 800.0000 mg | ORAL_TABLET | Freq: Three times a day (TID) | ORAL | 0 refills | Status: DC

## 2022-12-03 NOTE — ED Triage Notes (Signed)
Patient c/o sore throat x 3 days.  Patient denies taking any medication for his symptom.

## 2022-12-03 NOTE — Discharge Instructions (Signed)
We are treating you for a throat infection.  Take amoxicillin 500 mg twice daily for 10 days.  Take ibuprofen 800 mg 3 times daily.  Do not take NSAIDs with this medication including aspirin, ibuprofen/Advil, naproxen/Aleve.  If your symptoms are not improving within a few days you need to return as we would need to do additional testing as we discussed.  If anything worsens you have fever, swelling of her throat, shortness of breath, muffled voice, nausea/vomiting you need to be seen immediately.

## 2022-12-03 NOTE — ED Provider Notes (Signed)
[facility]    CSN: [id number] Arrival date & time: 12/03/22  1824      History   Chief Complaint Chief Complaint  Patient presents with   Sore Throat    HPI Danny Mccoy is a 37 y.o. male.   Patient presents today with a 3 to 4-day history of worsening sore throat.  He reports that sore throat pain is rated 7 on a 0-10 pain scale, described as sharp, worse with swallowing, no alleviating factors identified.  He denies any known sick contacts but was around many individuals at [facility] homecoming.  He denies any additional symptoms including cough, congestion, fever, nausea, vomiting.  He has a history of recurrent tonsillitis with similar presentation.  He was last treated with Augmentin in September 2024.  And denies any additional antibiotics since then.  He is able to eat and drink normally without difficulty.  Denies any swelling of his throat, shortness of breath, muffled voice.  Does report that he performed oral sex on a woman before symptoms began without barrier protection but is no specific concern for oral STI and declined testing today.    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 Authorizing Provider  amoxicillin (AMOXIL) 500 MG capsule Take 1 capsule (500 mg total) by mouth 2 (two) times daily. 12/03/22  Yes Delila Kuklinski K, PA-C  ibuprofen (ADVIL) 800 MG tablet Take 1 tablet (800 mg total) by mouth 3 (three) times daily. 12/03/22  Yes Levonia Wolfley K, PA-C  guaiFENesin (MUCINEX) 600 MG 12 hr tablet Take 1 tablet (600 mg total) by mouth 2 (two) times daily. 11/17/22   Wynonia Lawman A, NP  metoprolol tartrate (LOPRESSOR) 50 MG tablet Take 1 tablet (50 mg total) by mouth as directed. Take (1) tablet two hours before CT scan 10/30/22   Jake Bathe, MD    Family History Family History  Problem Relation Age of Onset   Healthy Mother    Diabetes Mother    Healthy Father    Diabetes Father    Breast cancer Maternal Grandmother    Prostate cancer Neg Hx    Colon cancer Neg Hx     Social History Social History   Tobacco Use   Smoking status: Never   Smokeless tobacco: Never   Tobacco comments:    black and mild 2x/week  Vaping Use   Vaping status: Never Used  Substance Use Topics   Alcohol use: Not Currently    Alcohol/week: 1.0 standard drink of alcohol    Types:  1 Cans of beer per week   Drug use: Never     Allergies   Patient has no known allergies.   Review of Systems Review of Systems  Constitutional:  Positive for activity change. Negative for appetite change, fatigue and fever.  HENT:  Positive for sore throat and trouble swallowing. Negative for congestion, sinus pressure, sneezing and voice change.   Respiratory:  Negative for cough and shortness of breath.   Cardiovascular:  Negative for chest pain.  Gastrointestinal:  Negative for abdominal pain, diarrhea, nausea and vomiting.  Neurological:  Negative for dizziness, light-headedness and headaches.     Physical Exam Triage Vital Signs ED  Triage Vitals  Encounter Vitals Group     BP 12/03/22 1851 117/75     Systolic BP Percentile --      Diastolic BP Percentile --      Pulse Rate 12/03/22 1851 86     Resp 12/03/22 1851 14     Temp 12/03/22 1851 98.3 F (36.8 C)     Temp Source 12/03/22 1851 Oral     SpO2 12/03/22 1851 94 %     Weight --      Height --      Head Circumference --      Peak Flow --      Pain Score 12/03/22 1850 6     Pain Loc --      Pain Education --      Exclude from Growth Chart --    No data found.  Updated Vital Signs BP 117/75 (BP Location: Right Arm)   Pulse 86   Temp 98.3 F (36.8 C) (Oral)   Resp 14   SpO2 94%   Visual Acuity Right Eye Distance:   Left Eye Distance:   Bilateral Distance:    Right Eye Near:   Left Eye Near:    Bilateral Near:     Physical Exam Vitals reviewed.  Constitutional:      General: He is awake.     Appearance: Normal appearance. He is well-developed. He is not ill-appearing.     Comments: Very pleasant male appears stated age in no acute distress sitting comfortably in exam room  HENT:     Head: Normocephalic and atraumatic.     Right Ear: Tympanic membrane, ear canal and external ear normal. Tympanic membrane is not erythematous or bulging.     Left Ear: Tympanic membrane, ear canal and external ear normal. Tympanic membrane is not erythematous or bulging.     Nose: Nose normal.     Mouth/Throat:     Pharynx: Uvula midline. Posterior oropharyngeal erythema present. No oropharyngeal exudate or uvula swelling.     Tonsils: No tonsillar exudate or tonsillar abscesses. 2+ on the right. 2+ on the left.     Comments: Erythema noted posterior oropharynx. Cardiovascular:     Rate and Rhythm: Normal rate and regular rhythm.     Heart sounds: Normal heart sounds, S1 normal and S2 normal. No murmur heard. Pulmonary:     Effort: Pulmonary effort is normal. No accessory muscle usage or respiratory distress.     Breath sounds: Normal breath sounds. No  stridor. No wheezing, rhonchi or rales.     Comments: Clear to auscultation bilaterally Abdominal:     General: Bowel sounds are normal.     Palpations: Abdomen is soft.     Tenderness: There is no abdominal tenderness.  Lymphadenopathy:     Head:     Right side  of head: No submental, submandibular or tonsillar adenopathy.     Left side of head: No submental, submandibular or tonsillar adenopathy.     Cervical: No cervical adenopathy.  Neurological:     Mental Status: He is alert.  Psychiatric:        Behavior: Behavior is cooperative.      UC Treatments / Results  Labs (all labs ordered are listed, but only abnormal results are displayed) Labs Reviewed - No data to display  EKG   Radiology No results found.  Procedures Procedures (including critical care time)  Medications Ordered in UC Medications - No data to display  Initial Impression / Assessment and Plan / UC Course  I have reviewed the triage vital signs and the nursing notes.  Pertinent labs & imaging results that were available during my care of the patient were reviewed by me and considered in my medical decision making (see chart for details).     Patient is well-appearing, afebrile, nontoxic, nontachycardic.  He adamantly opposed to having any kind of oral swab including for strep or oral STI as he has a severe gag reflex and has been traumatized with this in the past.  We had a long conversation that is difficult to know whether or not he actually needs antibiotics without the testing but he reports that he has had multiple episodes of tonsillitis in the past that have responded to amoxicillin within a few days.  He does visit concern for oral STI.  Ultimately did agree to give him amoxicillin 500 mg twice daily for 10 days but we discussed that if he continues to have symptoms he would need to return and we would have to consider testing for both strep and oral STI to which she was reluctant but agreed.  He  did request a prescription for ibuprofen 800 mg to help with pain and swelling we discussed that he is not to take NSAIDs with this medication due to risk of GI bleeding but can use Tylenol.  Recommended to gargle with warm salt water.  Discussed that if anything worsens or changes and he has swelling of his throat, shortness of breath, muffled voice, fever, nausea, vomiting he needs to be seen immediately.  Strict return precautions given.  Final Clinical Impressions(s) / UC Diagnoses   Final diagnoses:  Acute tonsillitis, unspecified etiology     Discharge Instructions      We are treating you for a throat infection.  Take amoxicillin 500 mg twice daily for 10 days.  Take ibuprofen 800 mg 3 times daily.  Do not take NSAIDs with this medication including aspirin, ibuprofen/Advil, naproxen/Aleve.  If your symptoms are not improving within a few days you need to return as we would need to do additional testing as we discussed.  If anything worsens you have fever, swelling of her throat, shortness of breath, muffled voice, nausea/vomiting you need to be seen immediately.     ED Prescriptions     Medication Sig Dispense Auth. Provider   amoxicillin (AMOXIL) 500 MG capsule Take 1 capsule (500 mg total) by mouth 2 (two) times daily. 20 capsule Zaleah Ternes K, PA-C   ibuprofen (ADVIL) 800 MG tablet Take 1 tablet (800 mg total) by mouth 3 (three) times daily. 21 tablet Noble Cicalese, Noberto Retort, PA-C      PDMP not reviewed this encounter.   Jeani Hawking, PA-C 12/03/22 1934

## 2022-12-17 ENCOUNTER — Encounter (HOSPITAL_COMMUNITY): Payer: Self-pay | Admitting: Emergency Medicine

## 2022-12-17 ENCOUNTER — Ambulatory Visit (HOSPITAL_COMMUNITY)
Admission: EM | Admit: 2022-12-17 | Discharge: 2022-12-17 | Disposition: A | Discharge status: Home / Self Care | Payer: Medicaid Other | Attending: Physician Assistant | Admitting: Physician Assistant

## 2022-12-17 DIAGNOSIS — Z202 Contact with and (suspected) exposure to infections with a predominantly sexual mode of transmission: Secondary | ICD-10-CM | POA: Insufficient documentation

## 2022-12-17 DIAGNOSIS — Z113 Encounter for screening for infections with a predominantly sexual mode of transmission: Secondary | ICD-10-CM | POA: Diagnosis present

## 2022-12-17 NOTE — ED Provider Notes (Signed)
[facility]    CSN: [id number] Arrival date & time: 12/17/22  1810      History   Chief Complaint Chief Complaint  Patient presents with   SEXUALLY TRANSMITTED DISEASE    HPI Decoda Ahnaf Caponi is a 37 y.o. male.   Patient presents today requesting STI testing.  Reports that he was sent a picture of an after visit summary with new medications from a previous sexual partner who is now taking metronidazole twice daily for 7 days.  They recommended that he be tested.  He is not currently having any symptoms including penile discharge, genital lesions, dysuria.  He was treated with amoxicillin for sore throat recently but denies additional antibiotics recently.  He does not was use condoms.  He is not interested in blood testing for HIV, hepatitis, syphilis.  Reports that she did not inform him whether or not she actually had an infection but just stated that she was taking this medication he should be tested.    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  Authorizing Provider  guaiFENesin (MUCINEX) 600 MG 12 hr tablet Take 1 tablet (600 mg total) by mouth 2 (two) times daily. 11/17/22   Wynonia Lawman A, NP  ibuprofen (ADVIL) 800 MG tablet Take 1 tablet (800 mg total) by mouth 3 (three) times daily. 12/03/22   Paislie Tessler, Noberto Retort, PA-C  metoprolol tartrate (LOPRESSOR) 50 MG tablet Take 1 tablet (50 mg total) by mouth as directed. Take (1) tablet two hours before CT scan 10/30/22   Jake Bathe, MD    Family History Family History  Problem Relation Age of Onset   Healthy Mother    Diabetes Mother    Healthy Father    Diabetes Father    Breast cancer Maternal Grandmother    Prostate cancer Neg Hx    Colon cancer Neg Hx     Social History Social History   Tobacco Use   Smoking status: Never   Smokeless tobacco: Never   Tobacco comments:    black and mild 2x/week  Vaping Use   Vaping status: Never Used  Substance Use Topics   Alcohol use: Not Currently    Alcohol/week: 1.0 standard drink of alcohol    Types: 1 Cans of beer per week   Drug use: Never     Allergies   Patient has no known allergies.   Review of Systems Review of Systems  Constitutional:  Negative for activity change, appetite change, fatigue and fever.  Gastrointestinal:  Negative for abdominal  pain, diarrhea, nausea and vomiting.  Genitourinary:  Negative for dysuria, flank pain, frequency, hematuria, penile discharge, penile pain and urgency.     Physical Exam Triage Vital Signs ED Triage Vitals  Encounter Vitals Group     BP 12/17/22 1836 116/77     Systolic BP Percentile --      Diastolic BP Percentile --      Pulse Rate 12/17/22 1836 61     Resp 12/17/22 1836 16     Temp 12/17/22 1836 98.4 F (36.9 C)     Temp Source 12/17/22 1836 Oral     SpO2 12/17/22 1836 96 %     Weight --      Height --      Head Circumference --      Peak Flow --      Pain Score 12/17/22 1839 0     Pain Loc --      Pain Education --      Exclude from Growth Chart --     No data found.  Updated Vital Signs BP 116/77 (BP Location: Right Arm)   Pulse 61   Temp 98.4 F (36.9 C) (Oral)   Resp 16   SpO2 96%   Visual Acuity Right Eye Distance:   Left Eye Distance:   Bilateral Distance:    Right Eye Near:   Left Eye Near:    Bilateral Near:     Physical Exam Vitals reviewed.  Constitutional:      General: He is awake.     Appearance: Normal appearance. He is well-developed. He is not ill-appearing.     Comments: Very pleasant male appears stated age in no acute distress sitting comfortably in exam room  HENT:     Head: Normocephalic and atraumatic.     Mouth/Throat:     Pharynx: Uvula midline. No oropharyngeal exudate or posterior oropharyngeal erythema.  Cardiovascular:     Rate and Rhythm: Normal rate and regular rhythm.     Heart sounds: Normal heart sounds, S1 normal and S2 normal. No murmur heard. Pulmonary:     Effort: Pulmonary effort is normal.     Breath sounds: Normal breath sounds. No stridor. No wheezing, rhonchi or rales.     Comments: Clear to auscultation bilaterally Abdominal:     Palpations: Abdomen is soft.     Tenderness: There is no abdominal tenderness.  Genitourinary:    Comments: Exam deferred Neurological:     Mental Status: He is alert.  Psychiatric:        Behavior: Behavior is cooperative.      UC Treatments / Results  Labs (all labs ordered are listed, but only abnormal results are displayed) Labs Reviewed  CYTOLOGY, (ORAL, ANAL, URETHRAL) ANCILLARY ONLY    EKG   Radiology No results found.  Procedures Procedures (including critical care time)  Medications Ordered in UC Medications - No data to display  Initial Impression / Assessment and Plan / UC Course  I have reviewed the triage vital signs and the nursing notes.  Pertinent labs & imaging results that were available during my care of the patient were reviewed by me and considered in my medical decision making (see chart for  details).     Patient is well-appearing, afebrile, nontoxic, nontachycardic.  STI swab was collected by patient and is pending.  He is currently asymptomatic so we will defer treatment until results are available.  Offered HIV/hepatitis/syphilis testing which he declined.  Had a long conversation that based  on the medication alone we cannot determine whether or not he was exposed to an STI as this medication is also used to treat other infections including BV that are not sexually transmitted.  Discussed that he should abstain from sex until he receives results and we discussed the importance of safe sex practices.  Discussed that if he develops any symptoms he should return for reevaluation.  We will contact him if we need to arrange any treatment based on his swab results.  All questions were answered to patient satisfaction.  Final Clinical Impressions(s) / UC Diagnoses   Final diagnoses:  Screening examination for STI  Possible exposure to STI   Discharge Instructions   None    ED Prescriptions   None    PDMP not reviewed this encounter.   Jeani Hawking, PA-C 12/17/22 1914

## 2022-12-17 NOTE — ED Triage Notes (Signed)
Pt presents to be tested for STD. Stated his "girl started take medication for infection" . Pt showed that medication was Flagyl.

## 2022-12-18 ENCOUNTER — Encounter (HOSPITAL_COMMUNITY): Payer: Self-pay | Admitting: *Deleted

## 2022-12-18 ENCOUNTER — Emergency Department (HOSPITAL_COMMUNITY)
Admission: EM | Admit: 2022-12-18 | Discharge: 2022-12-19 | Disposition: A | Discharge status: Home / Self Care | Payer: Medicaid Other | Attending: Emergency Medicine | Admitting: Emergency Medicine

## 2022-12-18 ENCOUNTER — Other Ambulatory Visit: Payer: Self-pay

## 2022-12-18 DIAGNOSIS — Z202 Contact with and (suspected) exposure to infections with a predominantly sexual mode of transmission: Secondary | ICD-10-CM | POA: Diagnosis present

## 2022-12-18 DIAGNOSIS — R369 Urethral discharge, unspecified: Secondary | ICD-10-CM | POA: Insufficient documentation

## 2022-12-18 LAB — CYTOLOGY, (ORAL, ANAL, URETHRAL) ANCILLARY ONLY
Chlamydia: NEGATIVE
Comment: NEGATIVE
Comment: NEGATIVE
Comment: NORMAL
Neisseria Gonorrhea: NEGATIVE
Trichomonas: NEGATIVE

## 2022-12-18 NOTE — ED Triage Notes (Signed)
The pts sexual partner has been diagnosed with trich  he reports that he has  had some burning when he urinates

## 2022-12-18 NOTE — ED Provider Triage Note (Signed)
Emergency Medicine Provider Triage Evaluation Note  Danny Mccoy , a 37 y.o. male  was evaluated in triage.  Pt complains of penile discharge and tingling for one day.  Reports his girlfriend tested positive for trichomoniasis and recently started taking metronidazole.  Declines testing for syphilis, HIV, hepatitis.  Review of Systems  Positive: As above Negative: As above  Physical Exam  BP 131/74 (BP Location: Right Arm)   Pulse 81   Temp 98.1 F (36.7 C) (Oral)   Resp 16   Ht 5\' 11"  (1.803 m)   Wt 97.6 kg   SpO2 98%   BMI 30.01 kg/m  Gen:   Awake, no distress   Resp:  Normal effort  MSK:   Moves extremities without difficulty    Medical Decision Making  Medically screening exam initiated at 9:41 PM.  Appropriate orders placed.  Danny Mccoy was informed that the remainder of the evaluation will be completed by another provider, this initial triage assessment does not replace that evaluation, and the importance of remaining in the ED until their evaluation is complete.     Arabella Merles, PA-C 12/18/22 2142

## 2022-12-19 LAB — URINALYSIS, COMPLETE (UACMP) WITH MICROSCOPIC
Bacteria, UA: NONE SEEN
Bilirubin Urine: NEGATIVE
Glucose, UA: NEGATIVE mg/dL
Hgb urine dipstick: NEGATIVE
Ketones, ur: NEGATIVE mg/dL
Leukocytes,Ua: NEGATIVE
Nitrite: NEGATIVE
Protein, ur: NEGATIVE mg/dL
Specific Gravity, Urine: 1.018 (ref 1.005–1.030)
pH: 7 (ref 5.0–8.0)

## 2022-12-19 MED ORDER — METRONIDAZOLE 500 MG PO TABS: 500.0000 mg | ORAL_TABLET | Freq: Two times a day (BID) | ORAL | 0 refills | Status: AC

## 2022-12-19 MED ORDER — METRONIDAZOLE 500 MG PO TABS
500.0000 mg | ORAL_TABLET | Freq: Once | ORAL | Status: AC
  Administered 2022-12-19: 500 mg via ORAL
  Filled 2022-12-19: qty 1

## 2022-12-19 NOTE — ED Provider Notes (Signed)
[place] EM[facility]ENC[facility]PARTMENT AT [facility] Provider Note   CSN: [id number] Arrival date & time: 12/18/22  2104     History  Chief Complaint  Patient presents with   Exposure to STD    Danny Mccoy is a 37 y.o. male who presents with concern for 24 hours of clear penile discharge and tingling with urination.  Patient seen 48 hours ago at urgent care following reported exposure to trichomonas. His girlfriend is only being treated with Flagyl for trichomoniasis.  Patient was asymptomatic at that time.  STI swab that was self collected in urgent care clinic was pan negative at that time, however patient did develop symptomatology after this testing was performed.  No fevers chills nausea vomiting abdominal pain urinary retention or incontinence.  No penile or testicular pain or swelling.  No skin lesions per patient.  HPI     Home Medications Prior to Admission medications   Medication Sig Start Date End Date Taking? Authorizing Provider  metroNIDAZOLE (FLAGYL) 500 MG tablet Take 1 tablet (500 mg total) by mouth 2 (two) times daily for 7 days. 12/19/22 12/26/22 Yes Greggory Safranek, Eugene Gavia, PA-C  guaiFENesin (MUCINEX) 600 MG 12 hr tablet Take 1 tablet (600 mg total) by mouth 2 (two) times daily. 11/17/22   Wynonia Lawman A, NP  ibuprofen (ADVIL) 800 MG tablet Take 1 tablet (800 mg total) by mouth 3 (three) times daily. 12/03/22   Raspet, Noberto Retort, PA-C  metoprolol tartrate (LOPRESSOR) 50 MG tablet Take 1 tablet (50 mg total) by mouth as directed. Take (1) tablet two hours before CT scan 10/30/22   Jake Bathe, MD      Allergies    Patient has no known allergies.    Review of Systems   Review of Systems  Respiratory: Negative.    Cardiovascular: Negative.   Genitourinary:  Positive for frequency and penile discharge. Negative for decreased urine volume, difficulty urinating, dysuria, enuresis, flank pain, genital sores, hematuria, penile swelling, scrotal  swelling, testicular pain and urgency.    Physical Exam Updated Vital Signs BP 131/74 (BP Location: Right Arm)   Pulse 81   Temp 98.1 F (36.7 C) (Oral)   Resp 16   Ht 5\' 11"  (1.803 m)   Wt 97.6 kg   SpO2 98%   BMI 30.01 kg/m  Physical Exam Vitals and nursing note reviewed. Exam conducted with a chaperone present (ED RN Tresa Endo).  Constitutional:      Appearance: He is not ill-appearing or toxic-appearing.  HENT:     Head: Normocephalic and atraumatic.  Eyes:     General: No scleral icterus.       Right eye: No discharge.        Left eye: No discharge.     Conjunctiva/sclera: Conjunctivae normal.  Pulmonary:     Effort: Pulmonary effort is normal.  Genitourinary:    Penis: Circumcised. Discharge present. No lesions.      Testes: Normal.     Epididymis:     Right: Normal.     Left: Normal.     Comments: Scant clear discharge present at the meatus.  No lesions. Skin:    General: Skin is warm and dry.  Neurological:     General: No focal deficit present.     Mental Status: He is alert.  Psychiatric:        Mood and Affect: Mood normal.     ED Results / Procedures / Treatments   Labs (all labs ordered  are listed, but only abnormal results are displayed) Labs Reviewed  URINALYSIS, COMPLETE (UACMP) WITH MICROSCOPIC - Abnormal; Notable for the following components:      Result Value   APPearance CLOUDY (*)    All other components within normal limits  WET PREP, GENITAL  GC/CHLAMYDIA PROBE AMP ([place]) NOT AT [facility]    EKG None  Radiology No results found.  Procedures Procedures    Medications Ordered in ED Medications  metroNIDAZOLE (FLAGYL) tablet 500 mg (has no administration in time range)    ED Course/ Medical Decision Making/ A&P                                 Medical Decision Making 37 year old male with known exposure to trichomoniasis now with clear penile discharge and urinary frequency.  Normal vitals on intake.  Scant clear  discharge present at the meatus and physical exam otherwise unremarkable.  Amount and/or Complexity of Data Reviewed External Data Reviewed: labs.    Details: Initial swab negative for GC/chlamydia and trichomoniasis on 11/7. Labs:     Details: GC chlamydia pending.  UA without evidence of infection.  Risk Prescription drug management.   Will treat presumptively for trichomoniasis despite recent testing that was negative.  GC and chlamydia pending at this time.  Discussed presumptive treatment at length, patient preference to wait for results of GC and chlamydia prior to treatment for these infections.  Will proceed with treatment with Flagyl for dual trichomoniasis exposure.  Christiane Ha voiced understanding of his medical evaluation and treatment plan. Each of their questions answered to their expressed satisfaction.  Return precautions were given.  Patient is well-appearing, stable, and was discharged in good condition.  This chart was dictated using voice recognition software, Dragon. Despite the best efforts of this provider to proofread and correct errors, errors may still occur which can change documentation meaning.          Final Clinical Impression(s) / ED Diagnoses Final diagnoses:  STD exposure    Rx / DC Orders ED Discharge Orders          Ordered    metroNIDAZOLE (FLAGYL) 500 MG tablet  2 times daily        12/19/22 0421              Lycan Davee, Eugene Gavia, PA-C 12/19/22 0425    Shon Baton, MD 12/20/22 [phone]

## 2022-12-19 NOTE — Discharge Instructions (Signed)
You have been treated presumptively for trichomonas given your symptoms and your known exposure to this infection.  Please take the antibiotic as prescribed the entire course.  The results of your remaining STI testing is pending at this time.  Should you test positive for gonorrhea or chlamydia you will receive a phone call from this hospital with further instructions for treatment.  Follow-up with your primary care doctor.  Please abstain from sexual contact until you have completed your course of antibiotics.

## 2022-12-20 ENCOUNTER — Ambulatory Visit (HOSPITAL_COMMUNITY)
Admission: EM | Admit: 2022-12-20 | Discharge: 2022-12-20 | Discharge status: Against Medical Advice | Payer: Medicaid Other

## 2022-12-20 ENCOUNTER — Encounter (HOSPITAL_COMMUNITY): Payer: Self-pay

## 2022-12-20 ENCOUNTER — Other Ambulatory Visit: Payer: Self-pay

## 2022-12-20 ENCOUNTER — Emergency Department (HOSPITAL_COMMUNITY)
Admission: EM | Admit: 2022-12-20 | Discharge: 2022-12-20 | Disposition: A | Discharge status: Home / Self Care | Payer: Medicaid Other | Attending: Emergency Medicine | Admitting: Emergency Medicine

## 2022-12-20 DIAGNOSIS — I10 Essential (primary) hypertension: Secondary | ICD-10-CM | POA: Diagnosis not present

## 2022-12-20 DIAGNOSIS — Z79899 Other long term (current) drug therapy: Secondary | ICD-10-CM | POA: Diagnosis not present

## 2022-12-20 DIAGNOSIS — Z202 Contact with and (suspected) exposure to infections with a predominantly sexual mode of transmission: Secondary | ICD-10-CM | POA: Diagnosis present

## 2022-12-20 NOTE — ED Notes (Signed)
Called pts name, no response.

## 2022-12-20 NOTE — ED Triage Notes (Signed)
Reports got a phone call while in church from the hospital  Reports was waiting on gc/chlamydia

## 2022-12-20 NOTE — ED Provider Notes (Signed)
[place] [facility]MENT AT [facility] Provider Note   CSN: [id number] Arrival date & time: 12/20/22  1142     History  Chief Complaint  Patient presents with   Labs Only    Danny Mccoy is a 37 y.o. male with past medical history significant for anxiety, depression, hypertension, HSV1 presents to the ED requesting lab results.  Patient states he was in church and missed a phone call for his gonorrhea and chlamydia results.  He is not currently complaining of any symptoms.       Home Medications Prior to Admission medications   Medication Sig Start Date End Date Taking? Authorizing Provider  guaiFENesin (MUCINEX) 600 MG 12 hr tablet Take 1 tablet (600 mg total) by mouth 2 (two) times daily. 11/17/22   Wynonia Lawman A, NP  ibuprofen (ADVIL) 800 MG tablet Take 1 tablet (800 mg total) by mouth 3 (three) times daily. 12/03/22   Raspet, Noberto Retort, PA-C  metoprolol tartrate (LOPRESSOR) 50 MG tablet Take 1 tablet (50 mg total) by mouth as directed. Take (1) tablet two hours before CT scan 10/30/22   Jake Bathe, MD  metroNIDAZOLE (FLAGYL) 500 MG tablet Take 1 tablet (500 mg total) by mouth 2 (two) times daily for 7 days. 12/19/22 12/26/22  Sponseller, Eugene Gavia, PA-C      Allergies    Patient has no known allergies.    Review of Systems   Review of Systems  All other systems reviewed and are negative.   Physical Exam Updated Vital Signs BP 108/63   Pulse 64   Temp 97.8 F (36.6 C)   Resp 17   Ht 5\' 11"  (1.803 m)   Wt 97.5 kg   SpO2 100%   BMI 29.99 kg/m  Physical Exam Vitals and nursing note reviewed.  Constitutional:      General: He is not in acute distress.    Appearance: Normal appearance. He is not ill-appearing or diaphoretic.  Cardiovascular:     Rate and Rhythm: Normal rate and regular rhythm.  Pulmonary:     Effort: Pulmonary effort is normal.  Neurological:     Mental Status: He is alert. Mental status is at baseline.   Psychiatric:        Mood and Affect: Mood normal.        Behavior: Behavior normal.     ED Results / Procedures / Treatments   Labs (all labs ordered are listed, but only abnormal results are displayed) Labs Reviewed - No data to display  EKG None  Radiology No results found.  Procedures Procedures    Medications Ordered in ED Medications - No data to display  ED Course/ Medical Decision Making/ A&P                                 Medical Decision Making  Patient presented to the ED to discuss lab results from STD/STI testing.  He is not currently reporting any symptoms.  Upon record review, laboratory testing is all negative.  Discussed this with patient.  Patient did not wish to wait for discharge paperwork and left the ED.          Final Clinical Impression(s) / ED Diagnoses Final diagnoses:  Encounter for assessment of STD exposure    Rx / DC Orders ED Discharge Orders     None         Chestine Spore, Shajuana Mclucas  R, PA-C 12/20/22 1256    Maia Plan, MD 12/21/22 [phone]

## 2022-12-20 NOTE — ED Notes (Addendum)
Patient coming in requesting STD results. Patient was seen at the urgent care 12/17/22 and tested negative for Chlamydia, Gonorrhea, and Trich. Results were negative, Patient informed of this.   Patient was then seen by the hospital 12/18/22. Informed the Patient that results from his testing there are not back yet. Patient verbalized understanding and states that is all he needed. Verified for the Patient that results should go to his mychart when resulted and that the ED AVS stated he would get a call with positive results.   Patient verbalized understanding and LWBS before triage.

## 2022-12-20 NOTE — ED Notes (Signed)
Discharged by provider

## 2022-12-21 LAB — GC/CHLAMYDIA PROBE AMP (~~LOC~~) NOT AT ARMC
Chlamydia: NEGATIVE
Comment: NEGATIVE
Comment: NORMAL
Neisseria Gonorrhea: NEGATIVE

## 2022-12-22 ENCOUNTER — Telehealth (HOSPITAL_COMMUNITY): Payer: Self-pay

## 2022-12-24 ENCOUNTER — Encounter (HOSPITAL_COMMUNITY): Payer: Self-pay

## 2022-12-28 ENCOUNTER — Ambulatory Visit (HOSPITAL_COMMUNITY)
Admission: RE | Admit: 2022-12-28 | Discharge: 2022-12-28 | Disposition: A | Discharge status: Home / Self Care | Payer: Medicaid Other | Source: Ambulatory Visit | Attending: Cardiology | Admitting: Cardiology

## 2022-12-28 DIAGNOSIS — R072 Precordial pain: Secondary | ICD-10-CM

## 2022-12-28 DIAGNOSIS — R079 Chest pain, unspecified: Secondary | ICD-10-CM | POA: Insufficient documentation

## 2022-12-28 MED ORDER — NITROGLYCERIN 0.4 MG SL SUBL
SUBLINGUAL_TABLET | SUBLINGUAL | Status: AC
  Filled 2022-12-28: qty 2

## 2022-12-28 MED ORDER — NITROGLYCERIN 0.4 MG SL SUBL
0.8000 mg | SUBLINGUAL_TABLET | Freq: Once | SUBLINGUAL | Status: AC
  Administered 2022-12-28: 0.8 mg via SUBLINGUAL

## 2022-12-28 MED ORDER — IOHEXOL 350 MG/ML SOLN
95.0000 mL | Freq: Once | INTRAVENOUS | Status: AC | PRN
  Administered 2022-12-28: 95 mL via INTRAVENOUS

## 2022-12-29 ENCOUNTER — Telehealth: Payer: Self-pay | Admitting: Cardiology

## 2022-12-29 NOTE — Telephone Encounter (Signed)
Pt is requesting a callback regarding his CT results. Please advise

## 2022-12-29 NOTE — Telephone Encounter (Signed)
Testing completed 11/18 and has not been reviewed by Dr Anne Fu at this time.  Will call pt once results are available.

## 2023-01-01 NOTE — Telephone Encounter (Signed)
Normal coronary arteries.  Reassuring CT   Left message for pt results are available on MyChart for his review.  Requested he call back if questions or concerns.

## 2023-01-01 NOTE — Telephone Encounter (Signed)
Pt has reviewed results and Dr Anne Fu comments via MyChart.

## 2023-01-04 ENCOUNTER — Ambulatory Visit: Payer: Self-pay | Admitting: Nurse Practitioner

## 2023-01-06 ENCOUNTER — Ambulatory Visit (HOSPITAL_COMMUNITY)
Admission: EM | Admit: 2023-01-06 | Discharge: 2023-01-06 | Disposition: A | Discharge status: Home / Self Care | Payer: Medicaid Other

## 2023-01-06 ENCOUNTER — Encounter (HOSPITAL_COMMUNITY): Payer: Self-pay

## 2023-01-06 DIAGNOSIS — S7011XA Contusion of right thigh, initial encounter: Secondary | ICD-10-CM

## 2023-01-06 NOTE — ED Triage Notes (Signed)
Patient here today with c/o right thigh pain X 2 days from hitting it on a table at work.

## 2023-01-06 NOTE — Discharge Instructions (Signed)
You can take Tylenol ibuprofen as needed for the pain.  Can also apply ice to the area to help with swelling.  Seek care if symptoms not improved with treatment.

## 2023-01-06 NOTE — ED Provider Notes (Signed)
[facility]    CSN: [id number] Arrival date & time: 01/06/23  1826      History   Chief Complaint Chief Complaint  Patient presents with  . Leg Injury    HPI Danny Mccoy is a 37 y.o. male.   Patient presents today with right thigh pain that began 2 days ago.  Reports at work, he hit his thigh on a conveyor belt.  He denies numbness or tingling going down the leg or pain around the area.  Reports area is red and a little bit raised.  Has not take anything for the pain so far.  Reports he has been unable to work because of the pain.  He is requesting a note for work.   Past Medical History:  Diagnosis Date  . Anxiety   . Asthma   . Depression   . ESBL (extended spectrum beta-lactamase) producing bacteria infection 10/16/2022  . HSV-1 infection   . Hypertension   . UTI (urinary tract infection) 10/15/2022    Patient Active Problem List   Diagnosis Date Noted  . Hospital discharge follow-up 10/28/2022  . Incomplete right bundle branch block 10/28/2022  . 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 Homelessness 10/28/2022  . Bilateral hand pain 10/28/2022  . Hypocalcemia 10/28/2022  . Proteinuria 10/28/2022  . ESBL (extended spectrum beta-lactamase) producing bacteria infection 10/16/2022  . UTI (urinary tract infection) 10/15/2022  . AKI (acute kidney injury) (HCC)   . Syncope 11/06/2020  . Constipation 06/30/2007    Past Surgical History:  Procedure Laterality Date  . FRACTURE SURGERY         Home Medications    Prior to Admission medications   Medication Sig Start Date End Date Taking? Authorizing Provider  tadalafil (CIALIS) 5 MG tablet Take 1 tablet by mouth daily. 10/30/22  Yes [provider]    Family History Family History  Problem Relation Age of Onset  . Healthy Mother   . Diabetes Mother   . Healthy Father   . Diabetes Father   . Breast cancer Maternal Grandmother   . Prostate  cancer Neg Hx   . Colon cancer Neg Hx     Social History Social History   Tobacco Use  . Smoking status: Never  . Smokeless tobacco: Never  . Tobacco comments:    black and mild 2x/week  Vaping Use  . Vaping status: Never Used  Substance Use Topics  . Alcohol use: Not Currently    Alcohol/week: 1.0 standard drink of alcohol    Types: 1 Cans of beer per week  . Drug use: Never     Allergies   Patient has no known allergies.   Review of Systems Review of Systems Per HPI  Physical Exam Triage Vital Signs ED Triage Vitals  Encounter Vitals Group     BP 01/06/23 1841 109/72     Systolic BP Percentile --      Diastolic BP Percentile --      Pulse Rate 01/06/23 1841 88     Resp 01/06/23 1841 16     Temp 01/06/23 1841 98.3 F (36.8 C)     Temp Source 01/06/23 1841 Oral     SpO2 01/06/23 1841 96 %     Weight 01/06/23 1840 200 lb (90.7 kg)     Height 01/06/23 1840 6' (1.829 m)     Head Circumference --      Peak Flow --  Pain Score 01/06/23 1840 7     Pain Loc --      Pain Education --      Exclude from Growth Chart --    No data found.  Updated Vital Signs BP 109/72 (BP Location: Left Arm)   Pulse 88   Temp 98.3 F (36.8 C) (Oral)   Resp 16   Ht 6' (1.829 m)   Wt 200 lb (90.7 kg)   SpO2 96%   BMI 27.12 kg/m   Visual Acuity Right Eye Distance:   Left Eye Distance:   Bilateral Distance:    Right Eye Near:   Left Eye Near:    Bilateral Near:     Physical Exam Vitals and nursing note reviewed.  Constitutional:      General: He is not in acute distress.    Appearance: Normal appearance. He is not toxic-appearing.  Pulmonary:     Effort: Pulmonary effort is normal. No respiratory distress.  Musculoskeletal:       Legs:     Comments: Slight erythematous discoloration noted to right thigh approximately 2 cm x 2 cm.  There is no surrounding erythema, redness, tenderness to touch.  Patient is neurovascular intact distal to the erythema.  Skin:     General: Skin is warm and dry.     Capillary Refill: Capillary refill takes less than 2 seconds.     Coloration: Skin is not jaundiced or pale.     Findings: No erythema.  Neurological:     Mental Status: He is alert and oriented to person, place, and time.  Psychiatric:        Behavior: Behavior is cooperative.     UC Treatments / Results  Labs (all labs ordered are listed, but only abnormal results are displayed) Labs Reviewed - No data to display  EKG   Radiology No results found.  Procedures Procedures (including critical care time)  Medications Ordered in UC Medications - No data to display  Initial Impression / Assessment and Plan / UC Course  I have reviewed the triage vital signs and the nursing notes.  Pertinent labs & imaging results that were available during my care of the patient were reviewed by me and considered in my medical decision making (see chart for details).   Patient is well-appearing, normotensive, afebrile, not tachycardic, not tachypneic, oxygenating well on room air.    1. Hematoma of right thigh, initial encounter Vitals and exam are overall reassuring There is a faint bruise noted to his right thigh, appears to be healing well, no red flags, no signs of compartment syndrome Supportive care discussed including Tylenol/ibuprofen for pain, ice as needed for swelling Work note provided and ER/return precautions discussed  The patient was given the opportunity to ask questions.  All questions answered to their satisfaction.  The patient is in agreement to this plan.    Final Clinical Impressions(s) / UC Diagnoses   Final diagnoses:  Hematoma of right thigh, initial encounter     Discharge Instructions      You can take Tylenol ibuprofen as needed for the pain.  Can also apply ice to the area to help with swelling.  Seek care if symptoms not improved with treatment.   ED Prescriptions   None    PDMP not reviewed this encounter.    Valentino Nose, NP 01/06/23 1904

## 2023-01-11 ENCOUNTER — Ambulatory Visit: Payer: Medicaid Other | Admitting: Nurse Practitioner

## 2023-01-11 ENCOUNTER — Encounter: Payer: Self-pay | Admitting: Nurse Practitioner

## 2023-01-11 VITALS — BP 105/39 | HR 89 | Temp 97.0°F | Wt 212.0 lb

## 2023-01-11 DIAGNOSIS — R8290 Unspecified abnormal findings in urine: Secondary | ICD-10-CM | POA: Diagnosis not present

## 2023-01-11 LAB — POCT URINALYSIS DIP (CLINITEK)
Blood, UA: NEGATIVE
Glucose, UA: NEGATIVE mg/dL
Nitrite, UA: NEGATIVE
POC PROTEIN,UA: NEGATIVE
Spec Grav, UA: >=1.03 — AB (ref 1.010–1.025)
Urobilinogen, UA: 0.2 U/dL
pH, UA: 5.5 (ref 5.0–8.0)

## 2023-01-11 NOTE — Assessment & Plan Note (Addendum)
UA negative for blood, negative protein negative nitrates  has trace leukocytes  due to trace leukocyte noted patient would ike urine culture to be ordered Patient encouraged to drink at least 64 ounces of water daily to maintain hydration

## 2023-01-11 NOTE — Assessment & Plan Note (Signed)
Lab Results  Component Value Date   NA 138 10/17/2022   K 3.6 10/17/2022   CO2 25 10/17/2022   GLUCOSE 104 (H) 10/17/2022   BUN 5 (L) 10/17/2022   CREATININE 0.97 10/17/2022   CALCIUM 8.3 (L) 10/17/2022   GFRNONAA >60 10/17/2022   Will recheck labs

## 2023-01-11 NOTE — Patient Instructions (Signed)

## 2023-01-11 NOTE — Progress Notes (Signed)
Established Patient Office Visit  Subjective:  Patient ID: Danny Mccoy, male    DOB: 1985-03-14  Age: 37 y.o. MRN: [id number]  CC:  Chief Complaint  Patient presents with   Abnormal Pap Smear    HPI Danny Mccoy is a 37 y.o. male   has a past medical history of Anxiety, Asthma, Depression, ESBL (extended spectrum beta-lactamase) producing bacteria infection (10/16/2022), HSV-1 infection, Hypertension, and UTI (urinary tract infection) (10/15/2022).   Patient presents with complaints of cloudy urine, mucus in the urine.  He denies fever, chills, dysuria abdominal pain nausea, vomiting.    Past Medical History:  Diagnosis Date   Anxiety    Asthma    Depression    ESBL (extended spectrum beta-lactamase) producing bacteria infection 10/16/2022   HSV-1 infection    Hypertension    UTI (urinary tract infection) 10/15/2022    Past Surgical History:  Procedure Laterality Date   FRACTURE SURGERY      Family History  Problem Relation Age of Onset   Healthy Mother    Diabetes Mother    Healthy Father    Diabetes Father    Breast cancer Maternal Grandmother    Prostate cancer Neg Hx    Colon cancer Neg Hx     Social History   Socioeconomic History   Marital status: Single    Spouse name: Not on file   Number of children: 1   Years of education: Not on file   Highest education level: Not on file  Occupational History   Not on file  Tobacco Use   Smoking status: Never   Smokeless tobacco: Never   Tobacco comments:    black and mild 2x/week  Vaping Use   Vaping status: Never Used  Substance and Sexual Activity   Alcohol use: Not Currently    Alcohol/week: 1.0 standard drink of alcohol    Types: 1 Cans of beer per week   Drug use: Never   Sexual activity: Yes    Birth control/protection: None  Other Topics Concern   Not on file  Social History Narrative   Homeless. Lives in the car.    Social Determinants of Health   Financial Resource  Strain: Not on file  Food Insecurity: No Food Insecurity (10/19/2022)   Hunger Vital Sign    Worried About Running Out of Food in the Last Year: Never true    Ran Out of Food in the Last Year: Never true  Transportation Needs: No Transportation Needs (10/19/2022)   PRAPARE - Administrator, Civil Service (Medical): No    Lack of Transportation (Non-Medical): No  Physical Activity: Not on file  Stress: Not on file  Social Connections: Not on file  Intimate Partner Violence: Not At Risk (10/19/2022)   Humiliation, Afraid, Rape, and Kick questionnaire    Fear of Current or Ex-Partner: No    Emotionally Abused: No    Physically Abused: No    Sexually Abused: No    Outpatient Medications Prior to Visit  Medication Sig Dispense Refill   tadalafil (CIALIS) 5 MG tablet Take 1 tablet by mouth daily.     No facility-administered medications prior to visit.    No Known Allergies  ROS Review of Systems  Constitutional:  Negative for appetite change, chills, fatigue and fever.  HENT:  Negative for congestion, postnasal drip, rhinorrhea and sneezing.   Respiratory:  Negative for cough, shortness of breath and wheezing.   Cardiovascular:  Negative for chest  pain, palpitations and leg swelling.  Gastrointestinal:  Negative for abdominal pain, constipation, nausea and vomiting.  Genitourinary:  Negative for difficulty urinating, dysuria, flank pain, frequency, genital sores and hematuria.  Musculoskeletal:  Negative for arthralgias, back pain, joint swelling and myalgias.  Skin:  Negative for color change, pallor, rash and wound.  Neurological:  Negative for dizziness, facial asymmetry, weakness, numbness and headaches.  Psychiatric/Behavioral:  Negative for behavioral problems, confusion, self-injury and suicidal ideas.       Objective:    Physical Exam Vitals and nursing note reviewed.  Constitutional:      General: He is not in acute distress.    Appearance: Normal  appearance. He is not ill-appearing, toxic-appearing or diaphoretic.  HENT:     Mouth/Throat:     Mouth: Mucous membranes are moist.     Pharynx: Oropharynx is clear. No oropharyngeal exudate or posterior oropharyngeal erythema.  Eyes:     General: No scleral icterus.       Right eye: No discharge.        Left eye: No discharge.     Extraocular Movements: Extraocular movements intact.     Conjunctiva/sclera: Conjunctivae normal.  Cardiovascular:     Rate and Rhythm: Normal rate and regular rhythm.     Pulses: Normal pulses.     Heart sounds: Normal heart sounds. No murmur heard.    No friction rub. No gallop.  Pulmonary:     Effort: Pulmonary effort is normal. No respiratory distress.     Breath sounds: Normal breath sounds. No stridor. No wheezing, rhonchi or rales.  Chest:     Chest wall: No tenderness.  Abdominal:     General: There is no distension.     Palpations: Abdomen is soft.     Tenderness: There is no abdominal tenderness. There is no right CVA tenderness, left CVA tenderness or guarding.  Musculoskeletal:        General: No swelling, tenderness, deformity or signs of injury.     Right lower leg: No edema.     Left lower leg: No edema.  Skin:    General: Skin is warm and dry.     Capillary Refill: Capillary refill takes less than 2 seconds.     Coloration: Skin is not jaundiced or pale.     Findings: No bruising, erythema or lesion.  Neurological:     Mental Status: He is alert and oriented to person, place, and time.     Motor: No weakness.     Coordination: Coordination normal.     Gait: Gait normal.  Psychiatric:        Mood and Affect: Mood normal.        Behavior: Behavior normal.        Thought Content: Thought content normal.        Judgment: Judgment normal.     BP (!) 105/39   Pulse 89   Temp (!) 97 F (36.1 C)   Wt 212 lb (96.2 kg)   SpO2 98%   BMI 28.75 kg/m  Wt Readings from Last 3 Encounters:  01/11/23 212 lb (96.2 kg)  01/06/23 200 lb  (90.7 kg)  12/20/22 215 lb (97.5 kg)    Lab Results  Component Value Date   TSH 2.130 04/07/2021   Lab Results  Component Value Date   WBC 6.1 10/17/2022   HGB 14.5 10/17/2022   HCT 42.6 10/17/2022   MCV 89.1 10/17/2022   PLT 225 10/17/2022   Lab Results  Component Value Date   NA 138 10/17/2022   K 3.6 10/17/2022   CO2 25 10/17/2022   GLUCOSE 104 (H) 10/17/2022   BUN 5 (L) 10/17/2022   CREATININE 0.97 10/17/2022   BILITOT 1.0 10/17/2022   ALKPHOS 69 10/17/2022   AST 17 10/17/2022   ALT 21 10/17/2022   PROT 6.3 (L) 10/17/2022   ALBUMIN 3.6 10/17/2022   CALCIUM 8.3 (L) 10/17/2022   ANIONGAP 8 10/17/2022   No results found for: "CHOL" No results found for: "HDL" No results found for: "LDLCALC" No results found for: "TRIG" No results found for: "CHOLHDL" Lab Results  Component Value Date   HGBA1C 5.5 10/16/2022      Assessment & Plan:   Problem List Items Addressed This Visit       Other   Hypocalcemia    Lab Results  Component Value Date   NA 138 10/17/2022   K 3.6 10/17/2022   CO2 25 10/17/2022   GLUCOSE 104 (H) 10/17/2022   BUN 5 (L) 10/17/2022   CREATININE 0.97 10/17/2022   CALCIUM 8.3 (L) 10/17/2022   GFRNONAA >60 10/17/2022   Will recheck labs      Relevant Orders   Basic metabolic panel   Cloudy urine - Primary    UA negative for blood, negative protein negative nitrates  has trace leukocytes  due to trace leukocyte noted patient would ike urine culture to be ordered Patient encouraged to drink at least 64 ounces of water daily to maintain hydration      Relevant Orders   Urine Culture   POCT URINALYSIS DIP (CLINITEK) (Completed)    No orders of the defined types were placed in this encounter.   Follow-up: Return in about 1 year (around 01/11/2024) for CPE.    Donell Beers, FNP

## 2023-01-12 ENCOUNTER — Telehealth: Payer: Self-pay

## 2023-01-12 ENCOUNTER — Other Ambulatory Visit: Payer: Medicaid Other

## 2023-01-12 NOTE — Telephone Encounter (Signed)
-----   Message from [place] sent at 01/11/2023  4:45 PM EST ----- Please call the patient and schedule an appointment for labs to follow-up for his low blood calcium level

## 2023-01-12 NOTE — Telephone Encounter (Signed)
Pt will need to be scheduled for a lab visit this week or next week. No answer lvm KH

## 2023-01-13 LAB — BASIC METABOLIC PANEL
BUN/Creatinine Ratio: 7 — ABNORMAL LOW (ref 9–20)
BUN: 8 mg/dL (ref 6–20)
CO2: 23 mmol/L (ref 20–29)
Calcium: 9 mg/dL (ref 8.7–10.2)
Chloride: 106 mmol/L (ref 96–106)
Creatinine, Ser: 1.14 mg/dL (ref 0.76–1.27)
Glucose: 80 mg/dL (ref 70–99)
Potassium: 4.4 mmol/L (ref 3.5–5.2)
Sodium: 144 mmol/L (ref 134–144)
eGFR: 85 mL/min/{1.73_m2} (ref >59)

## 2023-01-14 LAB — URINE CULTURE: Organism ID, Bacteria: NO GROWTH

## 2023-01-18 ENCOUNTER — Ambulatory Visit (HOSPITAL_COMMUNITY)
Admission: EM | Admit: 2023-01-18 | Discharge: 2023-01-18 | Disposition: A | Discharge status: Home / Self Care | Payer: Medicaid Other | Attending: Internal Medicine | Admitting: Internal Medicine

## 2023-01-18 ENCOUNTER — Encounter (HOSPITAL_COMMUNITY): Payer: Self-pay

## 2023-01-18 DIAGNOSIS — R197 Diarrhea, unspecified: Secondary | ICD-10-CM | POA: Diagnosis not present

## 2023-01-18 MED ORDER — ONDANSETRON 4 MG PO TBDP: 4.0000 mg | ORAL_TABLET | Freq: Three times a day (TID) | ORAL | 0 refills | Status: DC | PRN

## 2023-01-18 NOTE — ED Provider Notes (Signed)
[facility]    CSN: [id number] Arrival date & time: 01/18/23  1824      History   Chief Complaint Chief Complaint  Patient presents with   Diarrhea    HPI Danny Mccoy is a 37 y.o. male.   Patient presents to urgent care for evaluation of diarrhea that started approximately 5 days ago on January 13, 2023 the day after he ate pizza. Diarrhea is without blood/mucous to stools.  Reports generalized abdominal discomfort and cramping sensation that happens immediately before he goes to the restroom to have diarrhea.  Otherwise, denies abdominal pain.  Additionally reports intermittent nausea without vomiting.  Denies urinary symptoms, fevers, chills, recent water from an unknown source, recent travel, rash, dizziness, syncope, and recent sick contacts with similar symptoms.  No history of IBS.  No recent antibiotic or steroid use reported.  Denies watery bowel movements but states stools are loose and he is having approximately 3 stools per day.  He has not attempted use of any over-the-counter medications to help with symptoms PTA.   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 Authorizing Provider  ondansetron (ZOFRAN-ODT) 4 MG disintegrating tablet Take 1 tablet (4 mg total) by mouth every 8 (eight) hours as needed for nausea or vomiting. 01/18/23  Yes Carlisle Beers, FNP  tadalafil (CIALIS) 5 MG tablet Take 1 tablet by mouth daily. 10/30/22  Yes [provider]    Family History Family History  Problem Relation Age of Onset   Healthy Mother    Diabetes Mother    Healthy Father    Diabetes Father    Breast cancer Maternal Grandmother    Prostate cancer Neg Hx    Colon cancer Neg Hx     Social History Social History   Tobacco Use   Smoking status: Never   Smokeless tobacco: Never   Tobacco comments:    black and mild 2x/week  Vaping Use   Vaping status: Never Used  Substance Use Topics   Alcohol use: Not Currently    Alcohol/week: 1.0 standard drink of alcohol    Types: 1 Cans of beer per week   Drug use: Never     Allergies   Patient has no known allergies.   Review of Systems Review of Systems  Gastrointestinal:  Positive for diarrhea.  Per HPI   Physical Exam Triage Vital Signs ED Triage Vitals [01/18/23 1940]  Encounter Vitals Group     BP 111/62  Systolic BP Percentile      Diastolic BP Percentile      Pulse Rate 75     Resp 18     Temp 98 F (36.7 C)     Temp Source Oral     SpO2 96 %     Weight      Height      Head Circumference      Peak Flow      Pain Score      Pain Loc      Pain Education      Exclude from Growth Chart    No data found.  Updated Vital Signs BP 111/62 (BP Location: Left Arm)   Pulse 75   Temp 98 F (36.7 C) (Oral)   Resp 18   SpO2 96%   Visual Acuity Right Eye Distance:   Left Eye Distance:   Bilateral Distance:    Right Eye Near:   Left Eye Near:    Bilateral Near:     Physical Exam Vitals  and nursing note reviewed.  Constitutional:      Appearance: He is not ill-appearing or toxic-appearing.  HENT:     Head: Normocephalic and atraumatic.     Right Ear: Hearing and external ear normal.     Left Ear: Hearing and external ear normal.     Nose: Nose normal.     Mouth/Throat:     Lips: Pink.  Eyes:     General: Lids are normal. Vision grossly intact. Gaze aligned appropriately.     Extraocular Movements: Extraocular movements intact.     Conjunctiva/sclera: Conjunctivae normal.  Pulmonary:     Effort: Pulmonary effort is normal.  Abdominal:     General: Bowel sounds are normal.     Palpations: Abdomen is soft.     Tenderness: There is no abdominal tenderness. There is no right CVA tenderness, left CVA tenderness or guarding.     Comments: Benign abdominal exam.  Musculoskeletal:     Cervical back: Neck supple.  Skin:    General: Skin is warm and dry.     Capillary Refill: Capillary refill takes less than 2 seconds.     Findings: No rash.  Neurological:     General: No focal deficit present.     Mental Status: He is alert and oriented to person, place, and time. Mental status is at baseline.     Cranial Nerves: No dysarthria or facial asymmetry.  Psychiatric:        Mood and Affect: Mood normal.        Speech: Speech normal.        Behavior: Behavior normal.        Thought Content: Thought content normal.        Judgment: Judgment normal.      UC Treatments / Results  Labs (all labs ordered are listed, but only abnormal results are displayed) Labs Reviewed - No data to display  EKG   Radiology No results found.  Procedures Procedures (including critical care time)  Medications Ordered in UC Medications - No data to display  Initial Impression / Assessment and Plan / UC Course  I have reviewed the triage vital signs and the nursing notes.  Pertinent labs & imaging results that were available during my care of the patient were reviewed by me and  considered in my medical decision making (see chart for details).   1.  Diarrhea Diarrhea likely viral, we will manage this with as  needed use of Zofran for nausea for symptomatic relief. Brat diet encouraged, encouraged to increase water intake to stay well-hydrated. He appears well-hydrated on exam and with hemodynamically stable vital signs. Low suspicion for infectious diarrhea at this time. If symptoms fail to improve, he may return to clinic for stool study in the next 1 to 2 weeks.   Counseled patient on potential for adverse effects with medications prescribed/recommended today, strict ER and return-to-clinic precautions discussed, patient verbalized understanding.    Final Clinical Impressions(s) / UC Diagnoses   Final diagnoses:  Diarrhea, unspecified type     Discharge Instructions      Your diarrhea is likely viral.  Drink plenty of water (at least 64 ounces of water per day)  Eat the brat diet (bananas, toast, applesauce, rice, other bland foods).  Take Zofran for nausea every 8 hours as needed.  If you remain with persistent diarrhea over the next 4 to 5 days, or if you become dizzy, lightheaded, start having blood/mucus in the stools, or any other new or worsening symptoms, please return to clinic.  Follow-up with primary care provider as needed.    ED Prescriptions     Medication Sig Dispense Auth. Provider   ondansetron (ZOFRAN-ODT) 4 MG disintegrating tablet Take 1 tablet (4 mg total) by mouth every 8 (eight) hours as needed for nausea or vomiting. 20 tablet Carlisle Beers, FNP      PDMP not reviewed this encounter.   Carlisle Beers, [place] 01/18/23 2019

## 2023-01-18 NOTE — ED Triage Notes (Signed)
Pt is here for diarrhea x 2 weeks. No fever/chills.  Pt would like a providers note.

## 2023-01-18 NOTE — Discharge Instructions (Addendum)
Your diarrhea is likely viral.  Drink plenty of water (at least 64 ounces of water per day)  Eat the brat diet (bananas, toast, applesauce, rice, other bland foods).  Take Zofran for nausea every 8 hours as needed.  If you remain with persistent diarrhea over the next 4 to 5 days, or if you become dizzy, lightheaded, start having blood/mucus in the stools, or any other new or worsening symptoms, please return to clinic.  Follow-up with primary care provider as needed.

## 2023-01-21 ENCOUNTER — Other Ambulatory Visit: Payer: Self-pay

## 2023-01-21 ENCOUNTER — Encounter (HOSPITAL_COMMUNITY): Payer: Self-pay | Admitting: Emergency Medicine

## 2023-01-21 ENCOUNTER — Ambulatory Visit (HOSPITAL_COMMUNITY)
Admission: EM | Admit: 2023-01-21 | Discharge: 2023-01-21 | Disposition: A | Discharge status: Home / Self Care | Payer: Medicaid Other | Attending: Family Medicine | Admitting: Family Medicine

## 2023-01-21 DIAGNOSIS — B349 Viral infection, unspecified: Secondary | ICD-10-CM

## 2023-01-21 LAB — POC COVID19/FLU A&B COMBO
Covid Antigen, POC: NEGATIVE
Influenza A Antigen, POC: NEGATIVE
Influenza B Antigen, POC: NEGATIVE

## 2023-01-21 MED ORDER — BENZONATATE 100 MG PO CAPS: 100.0000 mg | ORAL_CAPSULE | Freq: Three times a day (TID) | ORAL | 0 refills | Status: DC | PRN

## 2023-01-21 MED ORDER — IBUPROFEN 800 MG PO TABS: 800.0000 mg | ORAL_TABLET | Freq: Three times a day (TID) | ORAL | 0 refills | Status: DC | PRN

## 2023-01-21 NOTE — ED Provider Notes (Signed)
[facility]    CSN: [id number] Arrival date & time: 01/21/23  1759      History   Chief Complaint Chief Complaint  Patient presents with   Generalized Body Aches   Sore Throat    HPI Danny Mccoy is a 37 y.o. male.    Sore Throat  Here for myalgia and malaise and sore throat and a little cough.  The sore throat is not very pronounced.  He maybe had some fever earlier today.  All the symptoms began today.  Prior to this he had had about 10 days of nausea and diarrhea.  He was seen on the ninth.  Those symptoms have resolved as of yesterday.  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 Authorizing Provider  benzonatate (TESSALON) 100 MG capsule Take 1 capsule (100 mg total) by mouth 3 (three) times daily as needed for cough. 01/21/23  Yes Zenia Resides, MD  ibuprofen (ADVIL) 800 MG tablet Take 1 tablet (800 mg total) by mouth every 8 (eight) hours as needed (pain). 01/21/23  Yes  Zenia Resides, MD  tadalafil (CIALIS) 5 MG tablet Take 1 tablet by mouth daily. 10/30/22  Yes [provider]    Family History Family History  Problem Relation Age of Onset   Healthy Mother    Diabetes Mother    Healthy Father    Diabetes Father    Breast cancer Maternal Grandmother    Prostate cancer Neg Hx    Colon cancer Neg Hx     Social History Social History   Tobacco Use   Smoking status: Never   Smokeless tobacco: Never   Tobacco comments:    black and mild 2x/week  Vaping Use   Vaping status: Never Used  Substance Use Topics   Alcohol use: Not Currently    Alcohol/week: 1.0 standard drink of alcohol    Types: 1 Cans of beer per week   Drug use: Never     Allergies   Patient has no known allergies.   Review of Systems Review of Systems   Physical Exam Triage Vital Signs ED Triage Vitals  Encounter Vitals Group     BP 01/21/23 1821 136/74     Systolic BP Percentile --      Diastolic BP Percentile --      Pulse Rate 01/21/23 1821 67     Resp 01/21/23 1821 18     Temp 01/21/23 1821 97.9 F (36.6 C)  Temp Source 01/21/23 1821 Oral     SpO2 01/21/23 1821 96 %     Weight --      Height --      Head Circumference --      Peak Flow --      Pain Score 01/21/23 1823 5     Pain Loc --      Pain Education --      Exclude from Growth Chart --    No data found.  Updated Vital Signs BP 136/74 (BP Location: Left Arm)   Pulse 67   Temp 97.9 F (36.6 C) (Oral)   Resp 18   SpO2 96%   Visual Acuity Right Eye Distance:   Left Eye Distance:   Bilateral Distance:    Right Eye Near:   Left Eye Near:    Bilateral Near:     Physical Exam Vitals reviewed.  Constitutional:      General: He is not in acute distress.    Appearance: He is not ill-appearing, toxic-appearing or diaphoretic.  HENT:     Right Ear: Tympanic membrane and ear canal normal.     Left Ear: Tympanic membrane and ear canal normal.     Nose: Nose normal.      Mouth/Throat:     Mouth: Mucous membranes are moist.     Comments: There is some mild erythema of the tonsillar pillars.  Clear mucus is draining Eyes:     Extraocular Movements: Extraocular movements intact.     Conjunctiva/sclera: Conjunctivae normal.     Pupils: Pupils are equal, round, and reactive to light.  Cardiovascular:     Rate and Rhythm: Normal rate and regular rhythm.     Heart sounds: No murmur heard. Pulmonary:     Effort: Pulmonary effort is normal. No respiratory distress.     Breath sounds: Normal breath sounds. No stridor. No wheezing, rhonchi or rales.  Musculoskeletal:     Cervical back: Neck supple.  Lymphadenopathy:     Cervical: No cervical adenopathy.  Skin:    Capillary Refill: Capillary refill takes less than 2 seconds.     Coloration: Skin is not jaundiced or pale.  Neurological:     General: No focal deficit present.     Mental Status: He is alert and oriented to person, place, and time.  Psychiatric:        Behavior: Behavior normal.      UC Treatments / Results  Labs (all labs ordered are listed, but only abnormal results are displayed) Labs Reviewed  POC COVID19/FLU A&B COMBO    EKG   Radiology No results found.  Procedures Procedures (including critical care time)  Medications Ordered in UC Medications - No data to display  Initial Impression / Assessment and Plan / UC Course  I have reviewed the triage vital signs and the nursing notes.  Pertinent labs & imaging results that were available during my care of the patient were reviewed by me and considered in my medical decision making (see chart for details).     It initially told nursing staff that he did not want to do a rapid strep due to it making him vomit when he gets the swab done.  Since he has very little sore throat and the appearance is not very indicative of strep we will not push that issue.  Flu and COVID tests are negative.  Tessalon Perles are sent in for the  cough and ibuprofen is sent in for the aches  and the sore throat.  Work note is provided Final Clinical Impressions(s) / UC Diagnoses   Final diagnoses:  Viral illness     Discharge Instructions      The test for flu and COVID were negative.  Take benzonatate 100 mg, 1 tab every 8 hours as needed for cough.  Take ibuprofen 800 mg--1 tab every 8 hours as needed for pain.       ED Prescriptions     Medication Sig Dispense Auth. Provider   benzonatate (TESSALON) 100 MG capsule Take 1 capsule (100 mg total) by mouth 3 (three) times daily as needed for cough. 21 capsule Zenia Resides, MD   ibuprofen (ADVIL) 800 MG tablet Take 1 tablet (800 mg total) by mouth every 8 (eight) hours as needed (pain). 21 tablet Laterrance Nauta, Janace Aris, MD      PDMP not reviewed this encounter.   Zenia Resides, MD 01/21/23 [id number]

## 2023-01-21 NOTE — ED Triage Notes (Signed)
Pt reports body aches and sore throat x1 day. Pt believes he had a fever today but did not check it with thermometer. Pt had diarrhea and N/V x2 weeks that stopped yesterday prior to the new symptoms.

## 2023-01-21 NOTE — Discharge Instructions (Signed)
The test for flu and COVID were negative.  Take benzonatate 100 mg, 1 tab every 8 hours as needed for cough.  Take ibuprofen 800 mg--1 tab every 8 hours as needed for pain.

## 2023-01-29 ENCOUNTER — Encounter (HOSPITAL_COMMUNITY): Payer: Self-pay | Admitting: *Deleted

## 2023-01-29 ENCOUNTER — Other Ambulatory Visit: Payer: Self-pay

## 2023-01-29 ENCOUNTER — Emergency Department (HOSPITAL_COMMUNITY)
Admission: EM | Admit: 2023-01-29 | Discharge: 2023-01-30 | Disposition: A | Discharge status: Home / Self Care | Payer: Medicaid Other | Attending: Emergency Medicine | Admitting: Emergency Medicine

## 2023-01-29 DIAGNOSIS — R369 Urethral discharge, unspecified: Secondary | ICD-10-CM | POA: Diagnosis present

## 2023-01-29 DIAGNOSIS — R300 Dysuria: Secondary | ICD-10-CM | POA: Insufficient documentation

## 2023-01-29 LAB — URINALYSIS, ROUTINE W REFLEX MICROSCOPIC
Bilirubin Urine: NEGATIVE
Glucose, UA: NEGATIVE mg/dL
Hgb urine dipstick: NEGATIVE
Ketones, ur: NEGATIVE mg/dL
Nitrite: NEGATIVE
Protein, ur: 30 mg/dL — AB
Specific Gravity, Urine: 1.025 (ref 1.005–1.030)
WBC, UA: >50 WBC/hpf (ref 0–5)
pH: 7 (ref 5.0–8.0)

## 2023-01-29 MED ORDER — STERILE WATER FOR INJECTION IJ SOLN
INTRAMUSCULAR | Status: AC
  Administered 2023-01-29: 1 mL
  Filled 2023-01-29: qty 10

## 2023-01-29 MED ORDER — CEFTRIAXONE SODIUM 500 MG IJ SOLR
250.0000 mg | Freq: Once | INTRAMUSCULAR | Status: AC
  Administered 2023-01-29: 250 mg via INTRAMUSCULAR
  Filled 2023-01-29: qty 500

## 2023-01-29 MED ORDER — AZITHROMYCIN 250 MG PO TABS
1000.0000 mg | ORAL_TABLET | Freq: Once | ORAL | Status: AC
  Administered 2023-01-29: 1000 mg via ORAL
  Filled 2023-01-29: qty 4

## 2023-01-29 NOTE — ED Provider Notes (Signed)
[place] EMERGENCY DEPARTMENT AT [facility] Provider Note   CSN: [id number] Arrival date & time: 01/29/23  2041     History  Chief Complaint  Patient presents with   uti    Danny Mccoy is a 37 y.o. male.  The history is provided by the patient and medical records.   37 year old male presenting to the ED with 1 week of dysuria and penile discharge.  He is concerned for STD.  Partner not currently having any symptoms.  He denies any hematuria, urinary frequency, fever, or rash.  No genital lesions.  Home Medications Prior to Admission medications   Medication Sig Start Date End Date Taking? Authorizing Provider  benzonatate (TESSALON) 100 MG capsule Take 1 capsule (100 mg total) by mouth 3 (three) times daily as needed for cough. 01/21/23   Zenia Resides, MD  ibuprofen (ADVIL) 800 MG tablet Take 1 tablet (800 mg total) by mouth every 8 (eight) hours as needed (pain). 01/21/23   Zenia Resides, MD  tadalafil (CIALIS) 5 MG tablet Take 1 tablet by mouth daily. 10/30/22   [provider]      Allergies    Patient has no known allergies.    Review of Systems   Review of Systems  Genitourinary:  Positive for dysuria and penile discharge.  All other systems reviewed and are negative.   Physical Exam Updated Vital Signs BP 110/79   Pulse 69   Temp 98.2 F (36.8 C)   Resp 16   Ht 6' (1.829 m)   Wt 96.2 kg   SpO2 100%   BMI 28.76 kg/m   Physical Exam Vitals and nursing note reviewed.  Constitutional:      Appearance: He is well-developed.  HENT:     Head: Normocephalic and atraumatic.  Eyes:     Conjunctiva/sclera: Conjunctivae normal.     Pupils: Pupils are equal, round, and reactive to light.  Cardiovascular:     Rate and Rhythm: Normal rate and regular rhythm.     Heart sounds: Normal heart sounds.  Pulmonary:     Effort: Pulmonary effort is normal.     Breath sounds: Normal breath sounds.  Abdominal:     General:  Bowel sounds are normal.     Palpations: Abdomen is soft.  Genitourinary:    Comments: Deferred, swab obtained in triage Musculoskeletal:        General: Normal range of motion.     Cervical back: Normal range of motion.  Skin:    General: Skin is warm and dry.  Neurological:     Mental Status: He is alert and oriented to person, place, and time.     ED Results / Procedures / Treatments   Labs (all labs ordered are listed, but only abnormal results are displayed) Labs Reviewed  URINALYSIS, ROUTINE W REFLEX MICROSCOPIC - Abnormal; Notable for the following components:      Result Value   APPearance HAZY (*)    Protein, ur 30 (*)    Leukocytes,Ua SMALL (*)    Bacteria, UA RARE (*)    All other components within normal limits  GC/CHLAMYDIA PROBE AMP ([place]) NOT AT [facility]    EKG None  Radiology No results found.  Procedures Procedures    Medications Ordered in ED Medications  cefTRIAXone (ROCEPHIN) injection 250 mg (250 mg Intramuscular Given 01/29/23 2331)  azithromycin (ZITHROMAX) tablet 1,000 mg (1,000 mg Oral Given 01/29/23 2329)  sterile water (preservative free) injection (1 mL  Given 01/29/23 2334)    ED Course/ Medical Decision Making/ A&P                                 Medical Decision Making Amount and/or Complexity of Data Reviewed ECG/medicine tests: ordered and independent interpretation performed.  Risk Prescription drug management.   37 year old male here with dysuria and penile discharge for the past week.  Partner without current symptoms.  He denies any penile lesions or rash.  No fever.  UA without acute signs of infection.  GC/CHL is pending.  Given his symptoms we will treat empirically.  Advised he will be notified if testing is positive, if so he is already been treated but will need to notify partner.  Can follow-up with PCP.  Return here for new concerns.  Final Clinical Impression(s) / ED Diagnoses Final diagnoses:  Penile  discharge    Rx / DC Orders ED Discharge Orders     None         Garlon Hatchet, PA-C 01/30/23 0005    Terrilee Files, MD 01/30/23 1153

## 2023-01-29 NOTE — ED Triage Notes (Signed)
The pt thinks he has a uti  he has had burning with urination for one week  no temp

## 2023-01-29 NOTE — ED Provider Triage Note (Signed)
Emergency Medicine Provider Triage Evaluation Note  Danny Mccoy , a 37 y.o. male  was evaluated in triage.  Pt complains of dysuria, penile discharge. Notes same has been ongoing x 1 week. Endorses concern for STD as well. Denies abdominal pain, fevers or chills.  Review of Systems  Positive:  Negative:   Physical Exam  BP 110/79   Pulse 69   Temp 98.2 F (36.8 C)   Resp 16   Ht 6' (1.829 m)   Wt 96.2 kg   SpO2 100%   BMI 28.76 kg/m  Gen:   Awake, no distress   Resp:  Normal effort  MSK:   Moves extremities without difficulty  Other:    Medical Decision Making  Medically screening exam initiated at 9:43 PM.  Appropriate orders placed.  Danny Mccoy was informed that the remainder of the evaluation will be completed by another provider, this initial triage assessment does not replace that evaluation, and the importance of remaining in the ED until their evaluation is complete.     Vear Clock 01/29/23 2144

## 2023-01-30 NOTE — Discharge Instructions (Signed)
You will be notified if STD testing is positive, however you have already been treated.  If positive, you will need to notify your partner. Return here for any new or acute changes.

## 2023-02-01 ENCOUNTER — Encounter (HOSPITAL_COMMUNITY): Payer: Self-pay

## 2023-02-01 ENCOUNTER — Other Ambulatory Visit: Payer: Self-pay

## 2023-02-01 ENCOUNTER — Emergency Department (HOSPITAL_COMMUNITY)
Admission: EM | Admit: 2023-02-01 | Discharge: 2023-02-01 | Disposition: A | Discharge status: Home / Self Care | Payer: Medicaid Other | Attending: Emergency Medicine | Admitting: Emergency Medicine

## 2023-02-01 DIAGNOSIS — R360 Urethral discharge without blood: Secondary | ICD-10-CM | POA: Diagnosis present

## 2023-02-01 DIAGNOSIS — N342 Other urethritis: Secondary | ICD-10-CM | POA: Insufficient documentation

## 2023-02-01 MED ORDER — CEFTRIAXONE SODIUM 500 MG IJ SOLR
500.0000 mg | Freq: Once | INTRAMUSCULAR | Status: AC
  Administered 2023-02-01: 500 mg via INTRAMUSCULAR
  Filled 2023-02-01: qty 500

## 2023-02-01 MED ORDER — STERILE WATER FOR INJECTION IJ SOLN
INTRAMUSCULAR | Status: AC
  Administered 2023-02-01: 1 mL
  Filled 2023-02-01: qty 10

## 2023-02-01 MED ORDER — DOXYCYCLINE HYCLATE 100 MG PO CAPS: 100.0000 mg | ORAL_CAPSULE | Freq: Two times a day (BID) | ORAL | 0 refills | Status: DC

## 2023-02-01 NOTE — ED Notes (Signed)
Pt asked to please wait 30 minutes before leaving after his IM injection but he refused. Marland Kitchen.The patient is A&OX4, ambulatory at d/c with independent steady gait, NAD. Pt verbalized understanding of d/c instructions, prescription  and follow up care.

## 2023-02-01 NOTE — Discharge Instructions (Signed)
Return if any problems.

## 2023-02-01 NOTE — ED Triage Notes (Signed)
Pt reports back pain and penile discharge with swollen testicles and pain with ejaculation. He tested positive for Chlamydia on 12/20.

## 2023-02-01 NOTE — ED Provider Notes (Signed)
[place] EMERGENCY DEPARTMENT AT [facility] Provider Note   CSN: [id number] Arrival date & time: 02/01/23  1634     History  Chief Complaint  Patient presents with   Penile Discharge    Danny Mccoy is a 37 y.o. male.  Pt complains of a penile discharge and swelling patient complains of discomfort with urination.  Patient reports he was seen here and had a positive test for chlamydia.  Patient reports he may have reexposed himself.  Patient was given 250 of Rocephin and 1 g of Zithromax when he was seen previously.  The history is provided by the patient. No language interpreter was used.  Penile Discharge       Home Medications Prior to Admission medications   Medication Sig Start Date End Date Taking? Authorizing Provider  doxycycline (VIBRAMYCIN) 100 MG capsule Take 1 capsule (100 mg total) by mouth 2 (two) times daily. 02/01/23  Yes Cheron Schaumann K, PA-C  benzonatate (TESSALON) 100 MG capsule Take 1 capsule (100 mg total) by mouth 3 (three) times daily as needed for cough. 01/21/23   Zenia Resides, MD  ibuprofen (ADVIL) 800 MG tablet Take 1 tablet (800 mg total) by mouth every 8 (eight) hours as needed (pain). 01/21/23   Zenia Resides, MD  tadalafil (CIALIS) 5 MG tablet Take 1 tablet by mouth daily. 10/30/22   [provider]      Allergies    Patient has no known allergies.    Review of Systems   Review of Systems  Genitourinary:  Positive for penile discharge.  All other systems reviewed and are negative.   Physical Exam Updated Vital Signs BP (!) 119/95 (BP Location: Right Arm)   Pulse 89   Temp 98.4 F (36.9 C)   Resp 14   Ht 6' (1.829 m)   Wt 97.5 kg   SpO2 96%   BMI 29.16 kg/m  Physical Exam Vitals and nursing note reviewed.  Constitutional:      Appearance: He is well-developed.  HENT:     Head: Normocephalic.  Cardiovascular:     Rate and Rhythm: Normal rate.  Pulmonary:     Effort: Pulmonary effort  is normal.  Abdominal:     General: There is no distension.  Musculoskeletal:        General: Normal range of motion.     Cervical back: Normal range of motion.  Skin:    General: Skin is warm.  Neurological:     General: No focal deficit present.     Mental Status: He is alert and oriented to person, place, and time.     ED Results / Procedures / Treatments   Labs (all labs ordered are listed, but only abnormal results are displayed) Labs Reviewed - No data to display  EKG None  Radiology No results found.  Procedures Procedures    Medications Ordered in ED Medications  cefTRIAXone (ROCEPHIN) injection 500 mg (500 mg Intramuscular Given 02/01/23 1751)  sterile water (preservative free) injection (1 mL  Given 02/01/23 1751)    ED Course/ Medical Decision Making/ A&P                                 Medical Decision Making Pt complains of continued penile discomfort  Amount and/or Complexity of Data Reviewed Labs:     Details: Patient positive for chlamydia  Risk Prescription drug management. Risk Details: Patient  given Rocephin IM and doxycycline.  Patient is counseled on safe sex.           Final Clinical Impression(s) / ED Diagnoses Final diagnoses:  Urethritis    Rx / DC Orders ED Discharge Orders          Ordered    doxycycline (VIBRAMYCIN) 100 MG capsule  2 times daily        02/01/23 1745           An After Visit Summary was printed and given to the patient.    Osie Cheeks 02/01/23 2223    Benjiman Core, MD 02/01/23 [phone]

## 2023-02-02 LAB — GC/CHLAMYDIA PROBE AMP (~~LOC~~) NOT AT ARMC
Chlamydia: POSITIVE — AB
Comment: NEGATIVE
Comment: NORMAL
Neisseria Gonorrhea: NEGATIVE

## 2023-02-09 ENCOUNTER — Ambulatory Visit (HOSPITAL_COMMUNITY)
Admission: EM | Admit: 2023-02-09 | Discharge: 2023-02-09 | Disposition: A | Discharge status: Home / Self Care | Payer: Medicaid Other | Attending: Emergency Medicine | Admitting: Emergency Medicine

## 2023-02-09 ENCOUNTER — Encounter (HOSPITAL_COMMUNITY): Payer: Self-pay

## 2023-02-09 DIAGNOSIS — J00 Acute nasopharyngitis [common cold]: Secondary | ICD-10-CM | POA: Diagnosis not present

## 2023-02-09 DIAGNOSIS — A749 Chlamydial infection, unspecified: Secondary | ICD-10-CM | POA: Diagnosis not present

## 2023-02-09 MED ORDER — AZITHROMYCIN 250 MG PO TABS
ORAL_TABLET | ORAL | Status: AC
  Filled 2023-02-09: qty 4

## 2023-02-09 MED ORDER — PSEUDOEPHEDRINE-GUAIFENESIN ER 60-600 MG PO TB12: 1.0000 | ORAL_TABLET | Freq: Two times a day (BID) | ORAL | 0 refills | Status: DC

## 2023-02-09 MED ORDER — AZITHROMYCIN 250 MG PO TABS
1000.0000 mg | ORAL_TABLET | Freq: Once | ORAL | Status: AC
  Administered 2023-02-09: 1000 mg via ORAL

## 2023-02-09 MED ORDER — IPRATROPIUM BROMIDE 0.03 % NA SOLN: 1.0000 | Freq: Two times a day (BID) | NASAL | 12 refills | Status: DC | PRN

## 2023-02-09 MED ORDER — CEFTRIAXONE SODIUM 1 G IJ SOLR
INTRAMUSCULAR | Status: AC
  Filled 2023-02-09: qty 10

## 2023-02-09 NOTE — ED Triage Notes (Signed)
Patient was prescribed doxycycline on 02/01/2023. Patient did not complete the medication.

## 2023-02-09 NOTE — ED Triage Notes (Signed)
Pt presents to the office for nausea and abdominal pain x 2 days.

## 2023-02-09 NOTE — Discharge Instructions (Addendum)
Mucinex D for sinus drainage Atrovent for congestion and nasal drainage Safe sex for the next 10 days  - You were given 1 g of azithromycin since you were unable to complete the doxycycline

## 2023-02-20 ENCOUNTER — Emergency Department (HOSPITAL_COMMUNITY)
Admission: EM | Admit: 2023-02-20 | Discharge: 2023-02-20 | Disposition: A | Discharge status: Home / Self Care | Payer: Medicaid Other | Attending: Student | Admitting: Student

## 2023-02-20 ENCOUNTER — Encounter (HOSPITAL_COMMUNITY): Payer: Self-pay | Admitting: *Deleted

## 2023-02-20 ENCOUNTER — Other Ambulatory Visit: Payer: Self-pay

## 2023-02-20 DIAGNOSIS — J45909 Unspecified asthma, uncomplicated: Secondary | ICD-10-CM | POA: Insufficient documentation

## 2023-02-20 DIAGNOSIS — Z202 Contact with and (suspected) exposure to infections with a predominantly sexual mode of transmission: Secondary | ICD-10-CM | POA: Insufficient documentation

## 2023-02-20 DIAGNOSIS — N342 Other urethritis: Secondary | ICD-10-CM | POA: Diagnosis not present

## 2023-02-20 DIAGNOSIS — I10 Essential (primary) hypertension: Secondary | ICD-10-CM | POA: Diagnosis not present

## 2023-02-20 DIAGNOSIS — Z711 Person with feared health complaint in whom no diagnosis is made: Secondary | ICD-10-CM

## 2023-02-20 LAB — URINALYSIS, ROUTINE W REFLEX MICROSCOPIC
Bilirubin Urine: NEGATIVE
Glucose, UA: NEGATIVE mg/dL
Hgb urine dipstick: NEGATIVE
Ketones, ur: NEGATIVE mg/dL
Leukocytes,Ua: NEGATIVE
Nitrite: NEGATIVE
Protein, ur: NEGATIVE mg/dL
Specific Gravity, Urine: 1.024 (ref 1.005–1.030)
pH: 5 (ref 5.0–8.0)

## 2023-02-20 MED ORDER — KETOROLAC TROMETHAMINE 15 MG/ML IJ SOLN
15.0000 mg | Freq: Once | INTRAMUSCULAR | Status: AC
  Administered 2023-02-20: 15 mg via INTRAMUSCULAR
  Filled 2023-02-20: qty 1

## 2023-02-20 MED ORDER — NAPROXEN 375 MG PO TABS: 375.0000 mg | ORAL_TABLET | Freq: Two times a day (BID) | ORAL | 0 refills | Status: DC

## 2023-02-20 NOTE — ED Provider Notes (Signed)
 [place] EMERGENCY DEPARTMENT AT [facility] Provider Note  CSN: [id number] Arrival date & time: 02/20/23 1441  Chief Complaint(s) Exposure to STD  HPI Danny Mccoy is a 38 y.o. male with PMH anxiety, recurrent ER presentations for urethritis/prostatitis, previous hospital mission for ESBL UTI who presents emergency department for evaluation of pain with ejaculation and back pain.  States that he was first seen on 01/29/2023 for urethritis and was diagnosed with chlamydia.  He was given ceftriaxone  azithromycin  at that time but then represented 3 days later for similar complaints and was transitioned to a 10-day course of doxycycline .  He states that he has finished his antibiotics but was not 100% compliant and stretch this over a 2-week course.  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  He denies associated nausea, vomiting, chest pain, shortness of breath, headache, fever or other systemic symptoms.   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 Authorizing Provider  naproxen  (NAPROSYN ) 375 MG tablet Take 1 tablet (375 mg total) by mouth 2 (two) times daily. 02/20/23  Yes Gray Doering, MD  benzonatate  (TESSALON ) 100 MG capsule Take 1 capsule (100 mg total) by mouth 3 (three) times daily as needed for cough. 01/21/23   Vonna Sharlet POUR, MD  ibuprofen  (ADVIL ) 800 MG tablet Take 1 tablet (800 mg total) by mouth every 8 (eight) hours as needed (pain). 01/21/23   Banister, Pamela K, MD  ipratropium (ATROVENT ) 0.03 % nasal spray Place 1 spray into both nostrils 2 (two) times daily as needed for rhinitis (congestion). 02/09/23   Blitch, Marval HERO, NP  pseudoephedrine -guaifenesin  (MUCINEX  D) 60-600 MG 12 hr tablet Take 1 tablet by mouth every 12 (twelve) hours. 02/09/23   Blitch, Marval HERO, NP  tadalafil  (CIALIS ) 5 MG tablet Take 1 tablet by mouth daily. 10/30/22   [provider]  Past Surgical History Past Surgical History:  Procedure Laterality Date   FRACTURE SURGERY     Family History Family History  Problem Relation Age of Onset   Healthy Mother    Diabetes Mother    Healthy Father    Diabetes Father    Breast cancer Maternal Grandmother    Prostate cancer Neg Hx    Colon cancer Neg Hx     Social History Social History   Tobacco Use   Smoking status: Never   Smokeless tobacco: Never   Tobacco comments:    black and mild 2x/week  Vaping Use   Vaping status: Never Used  Substance Use Topics   Alcohol use: Not Currently    Alcohol/week: 1.0 standard drink of alcohol    Types: 1 Cans of beer per week   Drug use: Never   Allergies Patient has no known allergies.  Review of Systems Review of Systems  Musculoskeletal:  Positive for back pain.    Physical Exam Vital Signs  I have reviewed the triage vital  signs BP 117/69 (BP Location: Right Arm)   Pulse 89   Temp 98.4 F (36.9 C)   Resp 18   Ht 6' (1.829 m)   Wt 79.4 kg   SpO2 98%   BMI 23.73 kg/m   Physical Exam Constitutional:      General: He is not in acute distress.    Appearance: Normal appearance.  HENT:     Head: Normocephalic and atraumatic.     Nose: No congestion or rhinorrhea.  Eyes:     General:        Right eye: No discharge.        Left eye: No discharge.     Extraocular Movements: Extraocular movements intact.     Pupils: Pupils are equal, round, and reactive to light.  Cardiovascular:     Rate and Rhythm: Normal rate and regular rhythm.     Heart sounds: No murmur heard. Pulmonary:     Effort: No respiratory distress.     Breath sounds: No wheezing or rales.  Abdominal:     General: There is no distension.     Tenderness: There is no abdominal tenderness.  Musculoskeletal:        General: Normal range of motion.     Cervical back: Normal range of motion.  Skin:    General: Skin is warm and dry.  Neurological:     General: No focal deficit present.     Mental Status: He is alert.     ED Results and Treatments Labs (all labs ordered are listed, but only abnormal results are displayed) Labs Reviewed  URINE CULTURE  URINALYSIS, ROUTINE W REFLEX MICROSCOPIC  GC/CHLAMYDIA PROBE AMP ([place]) NOT AT [facility]                                                                                                                          Radiology No results found.  Pertinent labs & imaging results that were available during my care of the patient were reviewed by me and considered in my medical decision making (see MDM for details).  Medications Ordered in ED Medications  ketorolac  (TORADOL ) 15 MG/ML injection 15 mg (has no administration in time range)                                                                                                                                      Procedures Procedures  (including critical care time)  Medical Decision Making / ED Course   This patient presents to the ED for concern of pain with ejaculation, back pain, this involves an extensive number of treatment options, and is a complaint that carries with it a high risk of complications and morbidity.  The differential diagnosis includes inflammatory prostatitis, recurrent bacterial prostatitis, multidrug-resistant chlamydia/gonorrhea, stone, pyelonephritis  MDM: Patient emergency room for evaluation of multiple complaints described above.  Physical exam is largely unremarkable with no expressible discharge on penile exam, no rash on penile exam, no CVA tenderness to palpation.  Abdominal exam soft and benign, nontender.  Urinalysis unremarkable.  Urine culture sent.  I spoke with the urologist on-call Dr. Elisabeth who states patient may have inflammatory prostatitis and would benefit from a NSAID course but without a urine culture to go on it is difficult to tailor antibiotic therapy.  Patient is afebrile here in the emergency department with a clean urinalysis.  We will retest for GC chlamydia and follow this in the outpatient setting.  Reviewing his previous culture data it appears that the time that he did grow ESBL there was 20,000 colony units and his urine does appear to have cleared from his last urinalysis after receiving ceftriaxone , azithromycin  and doxycycline .  Thus, we will treat for inflammatory prostatitis with Naprosyn  and follow his urine culture in the outpatient setting.  If he does grow any bacteria and likely should be treated with antibiotics and if he does grow ESBL he should return to the emergency department for IV antibiotics.  He should also return to the emergency department if his chlamydia is positive for further antibiotic therapy for multidrug-resistant chlamydia.  At this time he does not meet inpatient criteria for admission and will be discharged with  outpatient follow-up.  Return precautions given which he voiced understanding   Additional history obtained:  -External records from outside source obtained and reviewed including: Chart review including previous notes, labs, imaging, consultation notes   Lab Tests: -I ordered, reviewed, and interpreted labs.   The pertinent results include:   Labs Reviewed  URINE CULTURE  URINALYSIS, ROUTINE W REFLEX MICROSCOPIC  GC/CHLAMYDIA PROBE AMP ([place]) NOT AT [facility]     Medicines ordered and prescription drug management: Meds ordered this encounter  Medications   ketorolac  (TORADOL ) 15 MG/ML injection 15 mg   naproxen  (NAPROSYN ) 375 MG tablet    Sig: Take 1  tablet (375 mg total) by mouth 2 (two) times daily.    Dispense:  20 tablet    Refill:  0    -I have reviewed the patients home medicines and have made adjustments as needed  Critical interventions none  Consultations Obtained: I requested consultation with the urologist on-call Dr. Elisabeth,  and discussed lab and imaging findings as well as pertinent plan - they recommend: NSAID therapy, urine culture  Social Determinants of Health:  Factors impacting patients care include: Currently living out of a motel, states he is able to for medication and prescribed   Reevaluation: After the interventions noted above, I reevaluated the patient and found that they have :improved  Co morbidities that complicate the patient evaluation  Past Medical History:  Diagnosis Date   Anxiety    Asthma    Depression    ESBL (extended spectrum beta-lactamase) producing bacteria infection 10/16/2022   HSV-1 infection    Hypertension    UTI (urinary tract infection) 10/15/2022      Dispostion: I considered admission for this patient, but at this time he does not meet inpatient criteria for admission and we will follow-up outpatient culture data for further dispo, but at this time he is safe for discharge with outpatient  follow-up     Final Clinical Impression(s) / ED Diagnoses Final diagnoses:  Concern about STD in male without diagnosis  Urethritis     @PCDICTATION @    Albertina Dixon, MD 02/20/23 1629

## 2023-02-20 NOTE — ED Triage Notes (Signed)
 C/o penile discharge and burning was dx. With Chlamydia 2 months ago and states he didn't take his medication , states he wants to be rechecked.

## 2023-02-20 NOTE — ED Notes (Signed)
 Pt instructed to stay for 30 minutes after him IM Toradol  injection but he stated he did not want to wait and needed to go. He did not wait for d/c vitals either. SABRA.The patient is A&OX4, ambulatory at d/c with independent steady gait, NAD. Pt verbalized understanding of d/c instructions, prescriptions and follow up care.

## 2023-02-22 ENCOUNTER — Emergency Department (HOSPITAL_COMMUNITY)
Admission: EM | Admit: 2023-02-22 | Discharge: 2023-02-23 | Discharge status: Against Medical Advice | Payer: Medicaid Other | Attending: Emergency Medicine | Admitting: Emergency Medicine

## 2023-02-22 ENCOUNTER — Encounter (HOSPITAL_COMMUNITY): Payer: Self-pay | Admitting: Emergency Medicine

## 2023-02-22 DIAGNOSIS — K6289 Other specified diseases of anus and rectum: Secondary | ICD-10-CM | POA: Insufficient documentation

## 2023-02-22 DIAGNOSIS — Z5321 Procedure and treatment not carried out due to patient leaving prior to being seen by health care provider: Secondary | ICD-10-CM | POA: Insufficient documentation

## 2023-02-22 DIAGNOSIS — R109 Unspecified abdominal pain: Secondary | ICD-10-CM | POA: Diagnosis present

## 2023-02-22 DIAGNOSIS — N50819 Testicular pain, unspecified: Secondary | ICD-10-CM | POA: Diagnosis not present

## 2023-02-22 DIAGNOSIS — R102 Pelvic and perineal pain: Secondary | ICD-10-CM | POA: Diagnosis not present

## 2023-02-22 DIAGNOSIS — R369 Urethral discharge, unspecified: Secondary | ICD-10-CM | POA: Diagnosis not present

## 2023-02-22 DIAGNOSIS — R309 Painful micturition, unspecified: Secondary | ICD-10-CM | POA: Insufficient documentation

## 2023-02-22 LAB — GC/CHLAMYDIA PROBE AMP (~~LOC~~) NOT AT ARMC
Chlamydia: POSITIVE — AB
Comment: NEGATIVE
Comment: NORMAL
Neisseria Gonorrhea: NEGATIVE

## 2023-02-22 LAB — URINE CULTURE: Culture: NO GROWTH

## 2023-02-22 NOTE — ED Provider Triage Note (Signed)
 Emergency Medicine Provider Triage Evaluation Note  Danny Mccoy , a 38 y.o. male  was evaluated in triage.  Pt complains of abdominal pain, rectal pain. Was recently here for same, diagnosed with inflammatory prostatitis.  His urine cultures in the past have grown ESBL.  He has also been positive for chlamydia previously.  He has been treated with ceftriaxone , azithromycin , and doxycycline .  When he was here on 1/11 his urine was clean, he was swabbed for gonorrhea and chlamydia.  A urine culture was sent as well.  Urology was consulted and he was told if his UA grew ESBL he should return for IV antibiotics and if his chlamydia was positive he should return for further antibiotic therapy for multidrug-resistant chlamydia.  He was called earlier today and told that his chlamydia was positive.  Presents for treatment. Denies fevers or chills. Denies any sexual intercourse since being diagnosed with chlamydia.  Review of Systems  Positive:  Negative:   Physical Exam  BP (!) 125/91 (BP Location: Right Arm)   Pulse 72   Temp 98.2 F (36.8 C)   Resp 16   SpO2 93%  Gen:   Awake, no distress   Resp:  Normal effort  MSK:   Moves extremities without difficulty  Other:    Medical Decision Making  Medically screening exam initiated at 8:43 PM.  Appropriate orders placed.  Danny Mccoy was informed that the remainder of the evaluation will be completed by another provider, this initial triage assessment does not replace that evaluation, and the importance of remaining in the ED until their evaluation is complete.  Discussed with pharmacy, if prostatitis is clinically suspected, will need 4 weeks of Levaquin to treat multi drug resistant chlamydia   Nora Lauraine LABOR, PA-C 02/22/23 2058

## 2023-02-22 NOTE — ED Triage Notes (Addendum)
 Pt reports penile discharge and testicular pain. PT was seen for STIs and was on doxycycline. Reports pelvic pain and burning with urination and rectal pain.

## 2023-02-23 ENCOUNTER — Encounter (HOSPITAL_COMMUNITY): Payer: Self-pay

## 2023-02-23 ENCOUNTER — Ambulatory Visit (HOSPITAL_COMMUNITY)
Admission: EM | Admit: 2023-02-23 | Discharge: 2023-02-23 | Disposition: A | Discharge status: Home / Self Care | Payer: Medicaid Other | Attending: Emergency Medicine | Admitting: Emergency Medicine

## 2023-02-23 DIAGNOSIS — A749 Chlamydial infection, unspecified: Secondary | ICD-10-CM

## 2023-02-23 DIAGNOSIS — R369 Urethral discharge, unspecified: Secondary | ICD-10-CM

## 2023-02-23 MED ORDER — ACETAMINOPHEN 10 MG/ML IV SOLN
INTRAVENOUS | Status: AC
  Filled 2023-02-23: qty 300

## 2023-02-23 MED ORDER — PHENYLEPHRINE HCL-NACL 20-0.9 MG/250ML-% IV SOLN
INTRAVENOUS | Status: AC
Start: 2023-02-23 — End: ?
  Filled 2023-02-23: qty 500

## 2023-02-23 MED ORDER — DOXYCYCLINE HYCLATE 100 MG PO CAPS: 100.0000 mg | ORAL_CAPSULE | Freq: Two times a day (BID) | ORAL | 0 refills | Status: DC

## 2023-02-23 MED ORDER — PROPOFOL 1000 MG/100ML IV EMUL
INTRAVENOUS | Status: AC
  Filled 2023-02-23: qty 200

## 2023-02-23 NOTE — ED Provider Notes (Signed)
 [facility]    CSN: [id number] Arrival date & time: 02/23/23  1637      History   Chief Complaint Chief Complaint  Patient presents with   Exposure to STD    HPI Danny Mccoy is a 38 y.o. male.   Patient reports he was seen in the ED this weekend and tested positive for chlamydia.  Patient states he did not stay and wait to receive treatment.  Patient states that he continues to have penile discharge and pain in the testicles.  Denies testicular swelling, fever, and abdominal pain.   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 Authorizing Provider  doxycycline  (VIBRAMYCIN ) 100 MG capsule Take 1 capsule (100 mg total) by mouth 2 (two) times daily. 02/23/23  Yes Johnie, Mckynleigh Mussell A, NP  benzonatate  (TESSALON ) 100 MG capsule Take 1 capsule (100 mg total) by mouth 3 (three) times daily as needed for cough. 01/21/23   Vonna Sharlet POUR, MD  ibuprofen  (ADVIL ) 800 MG tablet Take 1 tablet (800 mg total) by  mouth every 8 (eight) hours as needed (pain). 01/21/23   Banister, Pamela K, MD  ipratropium (ATROVENT ) 0.03 % nasal spray Place 1 spray into both nostrils 2 (two) times daily as needed for rhinitis (congestion). 02/09/23   Blitch, Marval HERO, NP  naproxen  (NAPROSYN ) 375 MG tablet Take 1 tablet (375 mg total) by mouth 2 (two) times daily. 02/20/23   Kommor, [place], MD  pseudoephedrine -guaifenesin  (MUCINEX  D) 60-600 MG 12 hr tablet Take 1 tablet by mouth every 12 (twelve) hours. 02/09/23   Blitch, Marval HERO, NP  tadalafil  (CIALIS ) 5 MG tablet Take 1 tablet by mouth daily. 10/30/22   [provider]    Family History Family History  Problem Relation Age of Onset   Healthy Mother    Diabetes Mother    Healthy Father    Diabetes Father    Breast cancer Maternal Grandmother    Prostate cancer Neg Hx    Colon cancer Neg Hx     Social History Social History   Tobacco Use   Smoking status: Never   Smokeless tobacco: Never   Tobacco comments:    black and mild 2x/week  Vaping Use   Vaping status: Never Used  Substance Use Topics   Alcohol use: Not Currently    Alcohol/week: 1.0 standard drink of alcohol    Types: 1 Cans of beer per week   Drug use: Never     Allergies  Patient has no known allergies.   Review of Systems Review of Systems  Constitutional:  Negative for chills, fatigue and fever.  Genitourinary:  Positive for dysuria and penile discharge. Negative for hematuria, penile swelling, scrotal swelling and testicular pain.     Physical Exam Triage Vital Signs ED Triage Vitals  Encounter Vitals Group     BP 02/23/23 1759 126/82     Systolic BP Percentile --      Diastolic BP Percentile --      Pulse Rate 02/23/23 1759 83     Resp 02/23/23 1759 16     Temp 02/23/23 1759 98.4 F (36.9 C)     Temp Source 02/23/23 1759 Oral     SpO2 02/23/23 1759 96 %     Weight --      Height --      Head Circumference --      Peak Flow --      Pain Score 02/23/23  1801 0     Pain Loc --      Pain Education --      Exclude from Growth Chart --    No data found.  Updated Vital Signs BP 126/82 (BP Location: Right Arm)   Pulse 83   Temp 98.4 F (36.9 C) (Oral)   Resp 16   SpO2 96%   Visual Acuity Right Eye Distance:   Left Eye Distance:   Bilateral Distance:    Right Eye Near:   Left Eye Near:    Bilateral Near:     Physical Exam Vitals and nursing note reviewed.  Constitutional:      General: He is awake. He is not in acute distress.    Appearance: Normal appearance. He is well-developed and well-groomed.  Abdominal:     General: Abdomen is flat.     Palpations: Abdomen is soft.     Tenderness: There is no abdominal tenderness.  Genitourinary:    Comments: Exam deferred Skin:    General: Skin is warm and dry.  Neurological:     Mental Status: He is alert.  Psychiatric:        Behavior: Behavior is cooperative.      UC Treatments / Results  Labs (all labs ordered are listed, but only abnormal results are displayed) Labs Reviewed - No data to display  EKG   Radiology No results found.  Procedures Procedures (including critical care time)  Medications Ordered in UC Medications - No data to display  Initial Impression / Assessment and Plan / UC Course  I have reviewed the triage vital signs and the nursing notes.  Pertinent labs & imaging results that were available during my care of the patient were reviewed by me and considered in my medical decision making (see chart for details).     Patient present presented to the urgent care for treatment of chlamydia.  Patient was seen in ED this weekend and tested positive but did not wait for treatment.  Reports penile discharge and pain testicles.  Declines GU exam.  Prescribed doxycycline  for chlamydia coverage.  Discussed return precautions. Final Clinical Impressions(s) / UC Diagnoses   Final diagnoses:  Penile discharge  Chlamydia     Discharge Instructions       Start taking doxycycline  twice daily for 10 days.  Return here as needed.    ED Prescriptions     Medication Sig Dispense Auth. Provider   doxycycline  (VIBRAMYCIN ) 100 MG capsule Take 1 capsule (100 mg total) by mouth  2 (two) times daily. 20 capsule Johnie Flaming A, NP      PDMP not reviewed this encounter.   Johnie Flaming A, NP 02/23/23 1945

## 2023-02-23 NOTE — ED Triage Notes (Signed)
 Pt states he was seen in the ED this weekend and tested positive for chlamydia   states he is here for treatment. States he is still having discharge.

## 2023-02-23 NOTE — Discharge Instructions (Signed)
 Start taking doxycycline twice daily for 10 days.  Return here as needed.

## 2023-02-23 NOTE — ED Notes (Signed)
 Pt called for vitals to be reassessed, no response.

## 2023-03-20 ENCOUNTER — Other Ambulatory Visit: Payer: Self-pay

## 2023-03-20 ENCOUNTER — Emergency Department (HOSPITAL_COMMUNITY)
Admission: EM | Admit: 2023-03-20 | Discharge: 2023-03-20 | Disposition: A | Discharge status: Home / Self Care | Payer: Medicaid Other | Attending: Emergency Medicine | Admitting: Emergency Medicine

## 2023-03-20 DIAGNOSIS — R369 Urethral discharge, unspecified: Secondary | ICD-10-CM | POA: Diagnosis present

## 2023-03-20 LAB — URINALYSIS, ROUTINE W REFLEX MICROSCOPIC
Bilirubin Urine: NEGATIVE
Glucose, UA: NEGATIVE mg/dL
Hgb urine dipstick: NEGATIVE
Ketones, ur: NEGATIVE mg/dL
Leukocytes,Ua: NEGATIVE
Nitrite: NEGATIVE
Protein, ur: NEGATIVE mg/dL
Specific Gravity, Urine: 1.026 (ref 1.005–1.030)
pH: 6 (ref 5.0–8.0)

## 2023-03-20 MED ORDER — CEFTRIAXONE SODIUM 500 MG IJ SOLR
500.0000 mg | Freq: Once | INTRAMUSCULAR | Status: AC
  Administered 2023-03-20: 500 mg via INTRAMUSCULAR
  Filled 2023-03-20: qty 500

## 2023-03-20 MED ORDER — DOXYCYCLINE HYCLATE 100 MG PO CAPS: 100.0000 mg | ORAL_CAPSULE | Freq: Two times a day (BID) | ORAL | 0 refills | Status: DC

## 2023-03-20 MED ORDER — DOXYCYCLINE HYCLATE 100 MG PO TABS
100.0000 mg | ORAL_TABLET | Freq: Once | ORAL | Status: AC
  Administered 2023-03-20: 100 mg via ORAL
  Filled 2023-03-20: qty 1

## 2023-03-20 MED ORDER — LIDOCAINE HCL (PF) 1 % IJ SOLN
1.0000 mL | Freq: Once | INTRAMUSCULAR | Status: AC
  Administered 2023-03-20: 1 mL
  Filled 2023-03-20: qty 5

## 2023-03-20 NOTE — ED Triage Notes (Signed)
 Patient reports clear penile discharge for several days , diagnosed with STD in the past but did not finish the antibiotic prescribed .

## 2023-03-20 NOTE — ED Provider Notes (Signed)
  [facility] Emergency Department Provider Note MRN:  [id number]  Arrival date & time: 03/20/23     Chief Complaint   STD Treatment    History of Present Illness   Danny Mccoy is a 38 y.o. year-old male presents to the ED with chief complaint of penile discharge.  Reports recent diagnosis of chlamydia.  Lost his prescription.  States that he is still having symptoms. Would like a refill.  History provided by patient.   Review of Systems  Pertinent positive and negative review of systems noted in HPI.    Physical Exam   Vitals:   03/20/23 0306  BP: 130/75  Pulse: 81  Resp: 16  Temp: 97.9 F (36.6 C)  SpO2: 98%    CONSTITUTIONAL:  non toxic-appearing, NAD NEURO:  Alert and oriented x 3, CN 3-12 grossly intact EYES:  eyes equal and reactive ENT/NECK:  Supple, no stridor  CARDIO:  normal rate, appears well-perfused  PULM:  No respiratory distress,  GI/GU:  non-distended,  MSK/SPINE:  No gross deformities, no edema, moves all extremities  SKIN:  no rash, atraumatic   *Additional and/or pertinent findings included in MDM below  Diagnostic and Interventional Summary    EKG Interpretation Date/Time:    Ventricular Rate:    PR Interval:    QRS Duration:    QT Interval:    QTC Calculation:   R Axis:      Text Interpretation:         Labs Reviewed  URINALYSIS, ROUTINE W REFLEX MICROSCOPIC    No orders to display    Medications  cefTRIAXone  (ROCEPHIN ) injection 500 mg (has no administration in time range)  doxycycline  (VIBRA -TABS) tablet 100 mg (has no administration in time range)  lidocaine  (PF) (XYLOCAINE ) 1 % injection 1 mL (has no administration in time range)     Procedures  /  Critical Care Procedures  ED Course and Medical Decision Making  I have reviewed the triage vital signs, the nursing notes, and pertinent available records from the EMR.  Social Determinants Affecting Complexity of Care: Patient has no  clinically significant social determinants affecting this chief complaint..   ED Course:    Medical Decision Making Patient here with penile discharge.  Recent + chlamydia, but he lost his prescription and is asking for a refill.  Amount and/or Complexity of Data Reviewed Labs: ordered.  Risk Prescription drug management.         Consultants: No consultations were needed in caring for this patient.   Treatment and Plan: Emergency department workup does not suggest an emergent condition requiring admission or immediate intervention beyond  what has been performed at this time. The patient is safe for discharge and has  been instructed to return immediately for worsening symptoms, change in  symptoms or any other concerns    Final Clinical Impressions(s) / ED Diagnoses     ICD-10-CM   1. Penile discharge  R36.9       ED Discharge Orders          Ordered    doxycycline  (VIBRAMYCIN ) 100 MG capsule  2 times daily        03/20/23 0550              Discharge Instructions Discussed with and Provided to Patient:   Discharge Instructions   None      Vicky Charleston, PA-C 03/20/23 9448    Jerrol Agent, MD 03/20/23 1650

## 2023-03-23 ENCOUNTER — Telehealth: Payer: Self-pay

## 2023-03-23 NOTE — Transitions of Care (Post Inpatient/ED Visit) (Signed)
   03/23/2023  Name: Danny Mccoy MRN: [id number] DOB: January 01, 1986  Today's TOC FU Call Status:    Attempted to reach the patient regarding the most recent Inpatient/ED visit.  Follow Up Plan: Additional outreach attempts will be made to reach the patient to complete the Transitions of Care (Post Inpatient/ED visit) call.   Signature  [employer], [place]

## 2023-04-03 ENCOUNTER — Emergency Department (HOSPITAL_COMMUNITY)
Admission: EM | Admit: 2023-04-03 | Discharge: 2023-04-03 | Disposition: A | Discharge status: Home / Self Care | Payer: Medicaid Other | Attending: Emergency Medicine | Admitting: Emergency Medicine

## 2023-04-03 ENCOUNTER — Encounter (HOSPITAL_COMMUNITY): Payer: Self-pay | Admitting: *Deleted

## 2023-04-03 ENCOUNTER — Other Ambulatory Visit: Payer: Self-pay

## 2023-04-03 DIAGNOSIS — R369 Urethral discharge, unspecified: Secondary | ICD-10-CM | POA: Diagnosis present

## 2023-04-03 DIAGNOSIS — Z202 Contact with and (suspected) exposure to infections with a predominantly sexual mode of transmission: Secondary | ICD-10-CM | POA: Insufficient documentation

## 2023-04-03 LAB — URINALYSIS, ROUTINE W REFLEX MICROSCOPIC
Bilirubin Urine: NEGATIVE
Glucose, UA: NEGATIVE mg/dL
Hgb urine dipstick: NEGATIVE
Ketones, ur: NEGATIVE mg/dL
Leukocytes,Ua: NEGATIVE
Nitrite: NEGATIVE
Protein, ur: NEGATIVE mg/dL
Specific Gravity, Urine: 1.015 (ref 1.005–1.030)
pH: 8 (ref 5.0–8.0)

## 2023-04-03 MED ORDER — CEFTRIAXONE SODIUM 500 MG IJ SOLR
500.0000 mg | Freq: Once | INTRAMUSCULAR | Status: AC
  Administered 2023-04-03: 500 mg via INTRAMUSCULAR
  Filled 2023-04-03: qty 500

## 2023-04-03 MED ORDER — LIDOCAINE HCL (PF) 1 % IJ SOLN
INTRAMUSCULAR | Status: AC
  Filled 2023-04-03: qty 5

## 2023-04-03 MED ORDER — DOXYCYCLINE HYCLATE 100 MG PO CAPS: 100.0000 mg | ORAL_CAPSULE | Freq: Two times a day (BID) | ORAL | 0 refills | Status: AC

## 2023-04-03 NOTE — Discharge Instructions (Addendum)
 You are seen in the emergency department today for concerns of penile discharge.  Given that you have struggled to take the medications as prescribed, I am concerned that you likely have either a rebound of your symptoms or possible reinfection given prior sexual activity.  I have started you on STI treatment once again.  Please refrain from any sexual activity for 2 weeks after finishing the antibiotics.  When reengaging in sexual activity, please ensure that you are using a condom to reduce the risk of reinfection.  Please return to the emergency department for any new or worsening symptoms.

## 2023-04-03 NOTE — ED Triage Notes (Signed)
 C/o penile discharge x 1 month , c/o penile pain today

## 2023-04-03 NOTE — ED Provider Notes (Signed)
 [place] EMERGENCY DEPARTMENT AT [facility] Provider Note   CSN: [id number] Arrival date & time: 04/03/23  0457     History Chief Complaint  Patient presents with   Exposure to STD    Danny Mccoy is a 38 y.o. male.  Patient presents to the emergency department concerns of possible STI.  Endorses about 1 month of penile discharge and was previously diagnosed with what he is reporting to be chlamydia.  States that he was started on doxycycline and Rocephin.  Was inconsistent with his doxycycline use at home and states that he just finished the medication about 4 days ago.  Does feel the medication was working but symptoms have regressed.  Acknowledges that he has not taken the medication twice daily as prescribed.  Does also endorse a new partner within the last month with no condom use.  Some mild testicle pain.  Denies any rashes.   Exposure to STD       Home Medications Prior to Admission medications   Medication Sig Start Date End Date Taking? Authorizing Provider  doxycycline (VIBRAMYCIN) 100 MG capsule Take 1 capsule (100 mg total) by mouth 2 (two) times daily for 7 days. 04/03/23 04/10/23 Yes Smitty Knudsen, PA-C  benzonatate (TESSALON) 100 MG capsule Take 1 capsule (100 mg total) by mouth 3 (three) times daily as needed for cough. 01/21/23   Zenia Resides, MD  ibuprofen (ADVIL) 800 MG tablet Take 1 tablet (800 mg total) by mouth every 8 (eight) hours as needed (pain). 01/21/23   Zenia Resides, MD  ipratropium (ATROVENT) 0.03 % nasal spray Place 1 spray into both nostrils 2 (two) times daily as needed for rhinitis (congestion). 02/09/23   Blitch, Linde Gillis, NP  naproxen (NAPROSYN) 375 MG tablet Take 1 tablet (375 mg total) by mouth 2 (two) times daily. 02/20/23   Kommor, Madison, MD  pseudoephedrine-guaifenesin [facility] D) 60-600 MG 12 hr tablet Take 1 tablet by mouth every 12 (twelve) hours. 02/09/23   Blitch, Linde Gillis, NP  tadalafil (CIALIS) 5 MG  tablet Take 1 tablet by mouth daily. 10/30/22   [provider]      Allergies    Patient has no known allergies.    Review of Systems   Review of Systems  Genitourinary:  Positive for penile discharge.  All other systems reviewed and are negative.   Physical Exam Updated Vital Signs BP 128/83   Pulse 68   Temp 98 F (36.7 C) (Oral)   Resp 16   Ht 6' (1.829 m)   Wt 97.5 kg   SpO2 100%   BMI 29.16 kg/m  Physical Exam Vitals and nursing note reviewed.  Constitutional:      General: He is not in acute distress.    Appearance: He is well-developed.  HENT:     Head: Normocephalic and atraumatic.  Eyes:     Conjunctiva/sclera: Conjunctivae normal.  Cardiovascular:     Rate and Rhythm: Normal rate and regular rhythm.     Heart sounds: No murmur heard. Pulmonary:     Effort: Pulmonary effort is normal. No respiratory distress.     Breath sounds: Normal breath sounds.  Abdominal:     Palpations: Abdomen is soft.     Tenderness: There is no abdominal tenderness.  Genitourinary:    Comments: Patient deferred. Musculoskeletal:        General: No swelling.     Cervical back: Neck supple.  Skin:    General: Skin  is warm and dry.     Capillary Refill: Capillary refill takes less than 2 seconds.  Neurological:     Mental Status: He is alert.  Psychiatric:        Mood and Affect: Mood normal.     ED Results / Procedures / Treatments   Labs (all labs ordered are listed, but only abnormal results are displayed) Labs Reviewed  URINALYSIS, ROUTINE W REFLEX MICROSCOPIC - Abnormal; Notable for the following components:      Result Value   APPearance CLOUDY (*)    All other components within normal limits  GC/CHLAMYDIA PROBE AMP ([place]) NOT AT [facility]    EKG None  Radiology No results found.  Procedures Procedures    Medications Ordered in ED Medications  lidocaine (PF) (XYLOCAINE) 1 % injection (has no administration in time range)  cefTRIAXone  (ROCEPHIN) injection 500 mg (500 mg Intramuscular Given 04/03/23 0454)    ED Course/ Medical Decision Making/ A&P                                 Medical Decision Making Amount and/or Complexity of Data Reviewed Labs: ordered.  Risk Prescription drug management.   This patient presents to the ED for concern of STI exposure. Differential diagnosis includes gonorrhea, chlamydia, UTI, prostatitis   Lab Tests:  I Ordered, and personally interpreted labs.  The pertinent results include: UA showed cloudy urine but no nitrates or leukocytes, GC/chlamydia collected and pending   Medicines ordered and prescription drug management:  I ordered medication including Rocephin for STI Reevaluation of the patient after these medicines showed that the patient states I have reviewed the patients home medicines and have made adjustments as needed   Problem List / ED Course:  Patient presents the emergency department concerns of possible STI exposure.  He states that he was treated for STI about 1 month ago after presenting for penile discharge.  States that he was started on doxycycline received a dose of Rocephin in the emergency department.  He remarks that he did not take the doxycycline as prescribed as he has just barely finished the antibiotic about 4 days ago.  Had patient taken the medication as prescribed, the treatment would have ended on 03/27/2023 and he reports he finished this on 03/31/2023.  Patient also previously had initiated treatment on 02/23/2023 at an urgent care and at some point lost medication but did not seek repeat evaluation ~a month later.  I did express concern to the patient about medication noncompliance and antibiotic resistance. Physical exam deferred at this time.  No focal abdominal tenderness.  Given patient's reported penile discharge, consistent with STI.  Doubtful of prostatitis as there is rarely a purulent type drainage from the penis. Discussed management of  symptoms with a repeat antibiotic dosing.  Patient is requesting to be rechecked for GC/chlamydia.  I feel this is reasonable given that patient has struggled to fully comply with antibiotics and has had reexposure to another individual without condom use.  Specifically advised patient that he should avoid any sexual activity for about 2 weeks after finishing the antibiotic therapy to ensure resolution of symptoms.  Encourage patient return to the emergency department for any new or worsening symptoms.  Patient otherwise stable and discharged home.  Final Clinical Impression(s) / ED Diagnoses Final diagnoses:  Penile discharge  Possible exposure to STD    Rx / DC Orders ED Discharge Orders  Ordered    doxycycline (VIBRAMYCIN) 100 MG capsule  2 times daily        04/03/23 0818              Smitty Knudsen, PA-C 04/03/23 1610    Tegeler, Canary Brim, MD 04/03/23 [phone]

## 2023-04-05 LAB — GC/CHLAMYDIA PROBE AMP (~~LOC~~) NOT AT ARMC
Chlamydia: NEGATIVE
Comment: NEGATIVE
Comment: NORMAL
Neisseria Gonorrhea: NEGATIVE

## 2023-04-06 ENCOUNTER — Telehealth: Payer: Self-pay

## 2023-04-06 NOTE — Transitions of Care (Post Inpatient/ED Visit) (Signed)
   04/06/2023  Name: Danny Mccoy MRN: [id number] DOB: September 12, 1985  Today's TOC FU Call Status: Today's TOC FU Call Status:: Unsuccessful Call (1st Attempt) Unsuccessful Call (1st Attempt) Date: 04/06/23  Attempted to reach the patient regarding the most recent Inpatient/ED visit.  Follow Up Plan: Additional outreach attempts will be made to reach the patient to complete the Transitions of Care (Post Inpatient/ED visit) call.   Signature  [employer], [place]

## 2023-04-18 ENCOUNTER — Encounter (HOSPITAL_COMMUNITY): Payer: Self-pay | Admitting: Emergency Medicine

## 2023-04-18 NOTE — ED Provider Notes (Signed)
 [facility]    CSN: [id number] Arrival date & time: 02/09/23  1733      History   Chief Complaint No chief complaint on file.   HPI Danny Mccoy is a 38 y.o. male.   Patient was previously given medication for STI and did not complete treatment.  He is now having worsening symtpoms with penile discharge.  He is also reporting congestion and sore throat.       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 Authorizing Provider  ipratropium (ATROVENT) 0.03 % nasal spray Place 1 spray into both nostrils 2 (two) times daily as needed for rhinitis (congestion). 02/09/23  Yes Emira Eubanks, Linde Gillis, NP  pseudoephedrine-guaifenesin (MUCINEX D) 60-600 MG 12 hr tablet Take 1 tablet by mouth every 12 (twelve) hours. 02/09/23  Yes Loye Vento, Linde Gillis, NP  tadalafil (CIALIS) 5 MG tablet Take 1 tablet by mouth daily. 10/30/22  Yes [provider]  benzonatate (TESSALON) 100 MG capsule Take 1 capsule (100 mg total) by mouth 3 (three) times  daily as needed for cough. 01/21/23   Zenia Resides, MD  ibuprofen (ADVIL) 800 MG tablet Take 1 tablet (800 mg total) by mouth every 8 (eight) hours as needed (pain). 01/21/23   Zenia Resides, MD  naproxen (NAPROSYN) 375 MG tablet Take 1 tablet (375 mg total) by mouth 2 (two) times daily. 02/20/23   Kommor, Wyn Forster, MD    Family History Family History  Problem Relation Age of Onset   Healthy Mother    Diabetes Mother    Healthy Father    Diabetes Father    Breast cancer Maternal Grandmother    Prostate cancer Neg Hx    Colon cancer Neg Hx     Social History Social History   Tobacco Use   Smoking status: Never   Smokeless tobacco: Never   Tobacco comments:    black and mild 2x/week  Vaping Use   Vaping status: Never Used  Substance Use Topics   Alcohol use: Not Currently    Alcohol/week: 1.0 standard drink of alcohol    Types: 1 Cans of beer per week   Drug use: Never     Allergies   Patient has no known allergies.   Review of Systems Review of Systems  HENT:  Positive for congestion and sore throat.   Respiratory:  Positive for cough.   Genitourinary:  Positive for dysuria.  All other systems reviewed and are negative.    Physical Exam Triage Vital  Signs ED Triage Vitals [02/09/23 1841]  Encounter Vitals Group     BP 128/69     Systolic BP Percentile      Diastolic BP Percentile      Pulse Rate 83     Resp 16     Temp 97.9 F (36.6 C)     Temp Source Oral     SpO2 98 %     Weight      Height      Head Circumference      Peak Flow      Pain Score      Pain Loc      Pain Education      Exclude from Growth Chart    No data found.  Updated Vital Signs BP 128/69 (BP Location: Left Arm)   Pulse 83   Temp 97.9 F (36.6 C) (Oral)   Resp 16   SpO2 98%   Visual Acuity Right Eye Distance:   Left Eye Distance:   Bilateral Distance:    Right Eye Near:   Left Eye Near:    Bilateral Near:     Physical Exam Vitals and nursing note  reviewed.  HENT:     Mouth/Throat:     Pharynx: Oropharynx is clear.  Eyes:     Conjunctiva/sclera: Conjunctivae normal.  Cardiovascular:     Rate and Rhythm: Normal rate and regular rhythm.     Heart sounds: Normal heart sounds.  Pulmonary:     Effort: Pulmonary effort is normal.     Breath sounds: Normal breath sounds.  Genitourinary:    Penis: Normal.      Testes: Normal.  Skin:    Findings: No rash.  Neurological:     Mental Status: He is alert.      UC Treatments / Results  Labs (all labs ordered are listed, but only abnormal results are displayed) Labs Reviewed - No data to display  EKG   Radiology No results found.  Procedures Procedures (including critical care time)  Medications Ordered in UC Medications  azithromycin (ZITHROMAX) tablet 1,000 mg (1,000 mg Oral Given 02/09/23 1922)    Initial Impression / Assessment and Plan / UC Course  I have reviewed the triage vital signs and the nursing notes.  Pertinent labs & imaging results that were available during my care of the patient were reviewed by me and considered in my medical decision making (see chart for details).    Patient presents for concern of untreated STI.  Partner was positive for chlamydia.  He was given doxycycline and did not complete medication and now has sore throat, congestion with intermittent cough.  He is concerned that cold symptoms maybe related to STI.   On assessment, cold symptoms are not related.  He has a clear throat without any mucosal lesions.  We will treat STI in clinic and cold symptoms self management reviewed.  He is to f/u with pcp for continued needs    Final Clinical Impressions(s) / UC Diagnoses   Final diagnoses:  Acute nasopharyngitis (common cold)  Chlamydia infection     Discharge Instructions      Mucinex D for sinus drainage Atrovent for congestion and nasal drainage Safe sex for the next 10 days  - You were given 1 g of azithromycin since you  were unable to complete the doxycycline     ED Prescriptions     Medication Sig Dispense Auth. Provider   pseudoephedrine-guaifenesin (MUCINEX D) 60-600 MG 12 hr tablet  Take 1 tablet by mouth every 12 (twelve) hours. 60 tablet Avarie Tavano M, NP   ipratropium (ATROVENT) 0.03 % nasal spray Place 1 spray into both nostrils 2 (two) times daily as needed for rhinitis (congestion). 30 mL Patrick Salemi, Linde Gillis, NP      PDMP not reviewed this encounter.   Nelda Marseille, NP 04/18/23 1022

## 2023-05-10 ENCOUNTER — Ambulatory Visit (INDEPENDENT_AMBULATORY_CARE_PROVIDER_SITE_OTHER): Admitting: Nurse Practitioner

## 2023-05-10 ENCOUNTER — Encounter: Payer: Self-pay | Admitting: Nurse Practitioner

## 2023-05-10 VITALS — BP 117/63 | HR 62 | Temp 97.1°F | Wt 215.0 lb

## 2023-05-10 DIAGNOSIS — F331 Major depressive disorder, recurrent, moderate: Secondary | ICD-10-CM | POA: Diagnosis not present

## 2023-05-10 DIAGNOSIS — R300 Dysuria: Secondary | ICD-10-CM | POA: Insufficient documentation

## 2023-05-10 DIAGNOSIS — Z113 Encounter for screening for infections with a predominantly sexual mode of transmission: Secondary | ICD-10-CM

## 2023-05-10 DIAGNOSIS — Z7251 High risk heterosexual behavior: Secondary | ICD-10-CM

## 2023-05-10 DIAGNOSIS — F411 Generalized anxiety disorder: Secondary | ICD-10-CM | POA: Diagnosis not present

## 2023-05-10 DIAGNOSIS — N529 Male erectile dysfunction, unspecified: Secondary | ICD-10-CM

## 2023-05-10 MED ORDER — BUPROPION HCL ER (SR) 150 MG PO TB12: ORAL_TABLET | ORAL | 1 refills | Status: DC

## 2023-05-10 MED ORDER — HYDROXYZINE PAMOATE 25 MG PO CAPS: 25.0000 mg | ORAL_CAPSULE | Freq: Three times a day (TID) | ORAL | 1 refills | Status: DC | PRN

## 2023-05-10 MED ORDER — TADALAFIL 5 MG PO TABS: 5.0000 mg | ORAL_TABLET | Freq: Every day | ORAL | 0 refills | Status: DC

## 2023-05-10 MED ORDER — TADALAFIL 5 MG PO TABS: 5.0000 mg | ORAL_TABLET | Freq: Every day | ORAL | 2 refills | Status: AC

## 2023-05-10 NOTE — Assessment & Plan Note (Signed)
 Takes Cialis 5 mg daily Medication refilled - tadalafil (CIALIS) 5 MG tablet; Take 1 tablet (5 mg total) by mouth daily.  Dispense: 10 tablet; Refill: 0

## 2023-05-10 NOTE — Assessment & Plan Note (Addendum)
   2. GAD (generalized anxiety disorder)  - hydrOXYzine (VISTARIL) 25 MG capsule; Take 1 capsule (25 mg total) by mouth every 8 (eight) hours as needed.  Dispense: 30 capsule; Refill: 1 - AMB Referral [facility] Care Management  3. Moderate episode of recurrent major depressive disorder (HCC)  - buPROPion (WELLBUTRIN SR) 150 MG 12 hr tablet; Take 150 mg once daily in the morning; after 3 days increase to  150 mg twice daily  Dispense: 60 tablet; Refill: 1 - AMB Referral [facility] Care Management   Follow-up in 6 weeks

## 2023-05-10 NOTE — Assessment & Plan Note (Signed)
 1. Screening examination for STI (Primary)  - HepB+HepC+HIV Panel - Chlamydia/Gonococcus/Trichomonas, NAA - RPR Need to avoid high risk sexual behavior discussed Discussed the use of PrEP for HIV prevention and use of condoms

## 2023-05-10 NOTE — Progress Notes (Signed)
 Acute Office Visit  Subjective:     Patient ID: Danny Mccoy, male    DOB: September 20, 1985, 38 y.o.   MRN: [id number]  Chief Complaint  Patient presents with   Urinary Tract Infection    HPI Danny Mccoy  has a past medical history of Anxiety, Asthma, Depression, ESBL (extended spectrum beta-lactamase) producing bacteria infection (10/16/2022), HSV-1 infection, Hypertension, and UTI (urinary tract infection) (10/15/2022).  Patient presents with complaints of dysuria, anxiety and depression  Dysuria patient presents with complaints of dysuria on and off for a while now, has clear genital discharge.   He has had more than 1 partner recently.  Tested positive for chlamydia 2 months ago.Denies fever, abdominal pain, nausea, vomiting, genital rashes.  He is interested in STD testing   Anxiety and depression.  Stated that he has had anxiety and depression for 4 to 5 years.  Feels like he is being washed at work, he sometimes feels like he is idle at work and often feels like he might get in trouble at work.  He feels like his significant other has been cheating on him.  It is hard for him to make friends.  He denies SI, HI.  Is interested in medications and counseling   Review of Systems  Constitutional:  Negative for appetite change, chills, fatigue and fever.  HENT:  Negative for congestion, postnasal drip, rhinorrhea and sneezing.   Respiratory:  Negative for cough, shortness of breath and wheezing.   Cardiovascular:  Negative for chest pain, palpitations and leg swelling.  Gastrointestinal:  Negative for abdominal pain, constipation, nausea and vomiting.  Genitourinary:  Positive for dysuria and penile discharge. Negative for difficulty urinating, flank pain, frequency and genital sores.  Musculoskeletal:  Negative for arthralgias, back pain, joint swelling and myalgias.  Skin:  Negative for color change, pallor, rash and wound.  Neurological:  Negative for dizziness, facial asymmetry,  weakness, numbness and headaches.  Psychiatric/Behavioral:  Positive for sleep disturbance. Negative for behavioral problems, confusion, self-injury and suicidal ideas. The patient is nervous/anxious.         Objective:    BP 117/63   Pulse 62   Temp (!) 97.1 F (36.2 C)   Wt 215 lb (97.5 kg)   SpO2 99%   BMI 29.16 kg/m    Physical Exam Vitals and nursing note reviewed.  Constitutional:      General: He is not in acute distress.    Appearance: Normal appearance. He is not ill-appearing, toxic-appearing or diaphoretic.  HENT:     Mouth/Throat:     Mouth: Mucous membranes are moist.     Pharynx: Oropharynx is clear. No oropharyngeal exudate or posterior oropharyngeal erythema.  Eyes:     General: No scleral icterus.       Right eye: No discharge.        Left eye: No discharge.     Extraocular Movements: Extraocular movements intact.     Conjunctiva/sclera: Conjunctivae normal.  Cardiovascular:     Rate and Rhythm: Normal rate and regular rhythm.     Pulses: Normal pulses.     Heart sounds: Normal heart sounds. No murmur heard.    No friction rub. No gallop.  Pulmonary:     Effort: Pulmonary effort is normal. No respiratory distress.     Breath sounds: Normal breath sounds. No stridor. No wheezing, rhonchi or rales.  Chest:     Chest wall: No tenderness.  Abdominal:     General: There is no distension.  Palpations: Abdomen is soft.     Tenderness: There is no abdominal tenderness. There is no right CVA tenderness, left CVA tenderness or guarding.  Musculoskeletal:        General: No swelling, tenderness, deformity or signs of injury.     Right lower leg: No edema.     Left lower leg: No edema.  Skin:    General: Skin is warm and dry.     Capillary Refill: Capillary refill takes less than 2 seconds.     Coloration: Skin is not jaundiced or pale.     Findings: No bruising, erythema or lesion.  Neurological:     Mental Status: He is alert and oriented to person,  place, and time.     Motor: No weakness.     Coordination: Coordination normal.     Gait: Gait normal.  Psychiatric:        Mood and Affect: Mood normal.        Behavior: Behavior normal.        Thought Content: Thought content normal.        Judgment: Judgment normal.     No results found for any visits on 05/10/23.      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  PHQ-9 Total Score 18      - buPROPion [facility] SR) 150 MG 12 hr tablet; Take 150 mg once daily in the morning; after 3 days increase to  150 mg twice daily  Dispense: 60 tablet; Refill: 1 - AMB Referral VBCI Care Management for counseling      Relevant Medications   buPROPion (WELLBUTRIN SR) 150 MG 12 hr tablet   hydrOXYzine (VISTARIL) 25 MG capsule   Other Relevant Orders   AMB Referral VBCI Care Management   GAD (generalized anxiety disorder)     2. GAD (generalized anxiety disorder)  - hydrOXYzine (VISTARIL) 25 MG capsule; Take 1 capsule (25 mg total) by mouth every 8 (eight) hours as needed.  Dispense: 30 capsule; Refill: 1 - AMB Referral VBCI Care Management  3. Moderate episode of recurrent major depressive disorder (HCC)  - buPROPion (WELLBUTRIN SR) 150 MG 12 hr tablet; Take 150 mg once daily in the morning; after 3 days increase to  150 mg twice daily  Dispense: 60 tablet; Refill: 1 - AMB Referral VBCI Care Management   Follow-up in 6 weeks      Relevant Medications   buPROPion (WELLBUTRIN SR) 150 MG 12 hr tablet   hydrOXYzine (VISTARIL) 25 MG capsule   Other Relevant Orders   AMB Referral VBCI Care Management   Screening examination for STI - Primary   Relevant Orders   HepB+HepC+HIV Panel   Chlamydia/Gonococcus/Trichomonas, NAA   RPR   Dysuria   UA negative for  UTI Checking for chlamydia gonorrhea and trichomonas      High risk heterosexual behavior   1. Screening examination for STI (Primary)  - HepB+HepC+HIV Panel - Chlamydia/Gonococcus/Trichomonas, NAA - RPR Need to avoid high risk sexual behavior discussed Discussed the use of PrEP for HIV prevention and use of condoms       Erectile dysfunction   Takes Cialis 5 mg daily Medication refilled - tadalafil (CIALIS) 5 MG tablet; Take 1 tablet (5 mg  total) by mouth daily.  Dispense: 10 tablet; Refill: 0       Relevant Medications   tadalafil (CIALIS) 5 MG tablet    Meds ordered this encounter  Medications   DISCONTD: tadalafil (CIALIS) 5 MG tablet    Sig: Take 1 tablet (5 mg total) by mouth daily.    Dispense:  10 tablet    Refill:  0   buPROPion (WELLBUTRIN SR) 150 MG 12 hr tablet    Sig: Take 150 mg once daily in the morning; after 3 days increase to  150 mg twice daily    Dispense:  60 tablet    Refill:  1   hydrOXYzine (VISTARIL) 25 MG capsule    Sig: Take 1 capsule (25 mg total) by mouth every 8 (eight) hours as needed.    Dispense:  30 capsule    Refill:  1   tadalafil (CIALIS) 5 MG tablet    Sig: Take 1 tablet (5 mg total) by mouth daily.    Dispense:  30 tablet    Refill:  2    Return in about 6 weeks (around 06/21/2023) for ANXIETY, DEPRESSION.  Donell Beers, FNP

## 2023-05-10 NOTE — Assessment & Plan Note (Signed)
 UA negative for UTI Checking for chlamydia gonorrhea and trichomonas

## 2023-05-10 NOTE — Assessment & Plan Note (Addendum)
 Flowsheet Row Office Visit from 05/10/2023 in [facility] Dept Of [facility]  PHQ-9 Total Score 18      - buPROPion [facility] SR) 150 MG 12 hr tablet; Take 150 mg once daily in the morning; after 3 days increase to  150 mg twice daily  Dispense: 60 tablet; Refill: 1 - AMB Referral VBCI Care Management for counseling

## 2023-05-10 NOTE — Patient Instructions (Addendum)
 1. Screening examination for STI (Primary)  - HepB+HepC+HIV Panel - Chlamydia/Gonococcus/Trichomonas, NAA - RPR  . GAD (generalized anxiety disorder)  - hydrOXYzine (VISTARIL) 25 MG capsule; Take 1 capsule (25 mg total) by mouth every 8 (eight) hours as needed.  Dispense: 30 capsule; Refill: 1 - AMB Referral VBCI Care Management  . Moderate episode of recurrent major depressive disorder (HCC)  - buPROPion (WELLBUTRIN SR) 150 MG 12 hr tablet; Take 150 mg once daily in the morning; after 3 days increase to  150 mg twice daily  Dispense: 60 tablet; Refill: 1 - AMB Referral VBCI Care Management      Behavioral Health Resources:    What if I or someone I know is in crisis?   If you are thinking about harming yourself or having thoughts of suicide, or if you know someone who is, seek help right away.   Call your doctor or mental health care provider.   Call 911 or go to a hospital emergency room to get immediate help, or ask a friend or family member to help you do these things; IF YOU ARE IN [place], YOU MAY GO TO WALK-IN URGENT CARE 24/7 at [facility] (see below)   Call the [place] National Suicide Prevention Lifeline's toll-free, 24-hour hotline at 1-800-273-TALK 450-206-9570) or TTY: 1-800-799-4 TTY (938) 435-8273) to talk to a trained counselor.   If you are in crisis, make sure you are not left alone.    If someone else is in crisis, make sure he or she is not left alone     24 Hour :    [facility]  [address], [place], [place] [address] [phone] or [phone] WALK-IN URGENT CARE 24/7   [facility] Mobile Crisis: [phone]   [place] National Suicide Hotline: (847)708-1291   [facility] the [employer] (Domestic Violence, Rape & Victim Assistance)  508-657-6725   [facility]  [address], [place]  [address]   [phone] or  [phone]    [facility] [employer] [facility]: [phone] (8am-4pm) or [phone] (after hours)          [facility], [address], [place], [place]  [phone] Fax: [phone] [email or web address] *Interpreters available *Accepts all insurance and uninsured for Urgent Care needs *Accepts Medicaid and uninsured for outpatient treatment    [facility]   Mon-Fri: 8am-5pm [address], [place], [place] [phone](EXHBZ); [phone](fax) [email or web address]  *Accepts Medicare   [facility] [place], Fri: 8am-4pm [address], [place], [place] [address] [phone] (phone); [phone] (fax) ExShows.dk  *Accepts Medicare   [facility] Mon-Fri: 9am-5pm  [address], [place], [place] [phone] (phone); [phone]  MommyCollege.dk  *Accepts Medicaid   [facility] [place], 8:30am-12pm/1pm-2:30pm [address] [place], [place] [phone] (phone); [phone] (fax) www.[email or web address]  *Accepts Medicaid, sliding-scale*Bilingual services available   [facility] Mon-Fri, 8am-7pm [address] [place], [place]  [phone](XBDZH); [phone](fax) www.[email or web address]  *Accepts Medicaid *Bilingual services available   [facility] Mon-Fri: 8am-5pm, Saturday by appointment only [address] [place], [place], [place] [phone] (phone); [phone] (fax) [email or web address]    [facility] [address], [place], [place] [phone] [email or web address]  *Free & reduced services for uninsured and underinsured individuals *Bilingual services for Spanish-speaking clients 21 and under   [facility], [address],  [place], [place] [phone](UJWJX); ([phone](fax) KittenExchange.[email or web address]  *Bring your own interpreter at first visit *Accepts Medicare and Medicaid   [facility] Mon-Fri: 9am-5:30pm [address], [place], [place] [phone] (phone), [phone] (fax) After hours crisis line: [phone] www.[email or web address]  *Accepts Medicare and Medicaid   [employer], 8am-6pm [address] [place], [place]  [phone] (phone); [phone] (fax) http://[email or web address]  *Subsidized costs available   [facility] Mon-Fri: 8am-4pm [address] [place], [place] [phone](ZYSAY); [phone](fax) www.p[email or web address]  *Accepts Medicaid   [facility] Same day access hours: Mon-Fri, 8:30-3pm Crisis hours: Mon-Fri, 8am-5pm [address], [place], [place] [phone]   [facility] [employer] Same day access hours: Mon-Fri, 8:30-3pm Crisis hours: Mon-Fri, 8am-8pm [address], [place], [place] [phone] (phone); [phone] (fax) www.[email or web address]  *Accepts Medicaid and Medicare   [facility] [place], [place], Fri: 9am-9pm Tues, Thurs: 9am-6pm [address] [place], [place], [place]  [phone] (phone); [phone] (fax) [email or web address]://[email or web address]  *(Accepts Medicare and Medicaid; payment plans available)*Bilingual services available   [facility] [address] [place], [place] [phone] (phone); [phone] (fax) www.[email or web address]    [facility] [address], [place], [place]  [phone]  RackRewards.[email or web address]  *Bilingual services available   [facility]) Mon-Thurs: 8am-8pm [address], [place], [place] [phone] (phone); [phone] (fax) ScrapbookLive.si  *Accepts Medicaid*Bilingual  services available   [facility] [address] [place], [place] [phone] (phone) BrotherBig.[email or web address]  *Accepts Medicaid *Bilingual services available   [facility] Mon-Fri, 9am-4:45pm [address], [place], [place] [phone] (phone); [phone] (fax) http://[email or web address]-[email or web address]  *Accepts Medicare   [facility] Mon-Thurs: 8:30-8pm, Fri: 8:30am-7pm [address] [place], [place] (3rd floor) [phone] (phone); [phone] (fax) [email or web address]  *Accepts Medicaid; income-based reduced rates available   [facility] Mon-Fri: 8am-5pm [address], [place], [place] [address] [phone] (phone); [phone] (fax) [email or web address]://[email or web address]  *Accepts Medicaid*Bilingual services available     [facility] [place])  [address], [place] [phone] www.[email or web address]  *Provides direct services to individuals in recovery from mental illness, including support groups, recovery skills classes, and one on one peer support   [employer] on Mental Illness) [place] helpline: (470) 887-1703  [facility] [place] helpline: 507-148-4646 https://namiguilford.org  *A community hub for information relating to local resources and services for the friends and families of individuals living alongside a mental health condition, as well as the individuals themselves. Classes and support groups also provided

## 2023-05-11 ENCOUNTER — Telehealth: Payer: Self-pay | Admitting: *Deleted

## 2023-05-11 LAB — HEPB+HEPC+HIV PANEL
HIV Screen 4th Generation wRfx: NONREACTIVE
Hep B C IgM: NEGATIVE
Hep B Core Total Ab: NEGATIVE
Hep B E Ab: NONREACTIVE
Hep B E Ag: NEGATIVE
Hep B Surface Ab, Qual: REACTIVE
Hep C Virus Ab: NONREACTIVE
Hepatitis B Surface Ag: NEGATIVE

## 2023-05-11 LAB — POCT URINALYSIS DIP (CLINITEK)
Bilirubin, UA: NEGATIVE
Blood, UA: NEGATIVE
Glucose, UA: NEGATIVE mg/dL
Ketones, POC UA: NEGATIVE mg/dL
Leukocytes, UA: NEGATIVE
Nitrite, UA: NEGATIVE
POC PROTEIN,UA: NEGATIVE
Spec Grav, UA: >=1.03 — AB (ref 1.010–1.025)
Urobilinogen, UA: 1 U/dL
pH, UA: 6 (ref 5.0–8.0)

## 2023-05-11 LAB — RPR: RPR Ser Ql: NONREACTIVE

## 2023-05-11 NOTE — Progress Notes (Unsigned)
 Complex Care Management Note Care Guide Note  05/11/2023 Name: Danny Mccoy MRN: [id number] DOB: 1985-07-30   Complex Care Management Outreach Attempts: An unsuccessful telephone outreach was attempted today to offer the patient information about available complex care management services.  Follow Up Plan:  Additional outreach attempts will be made to offer the patient complex care management information and services.   Encounter Outcome:  No Answer  Gwenevere Ghazi  [facility]  [facility], [facility] Guide  Direct Dial: [phone]  Fax [phone]

## 2023-05-11 NOTE — Addendum Note (Signed)
 Addended by: Renelda Loma on: 05/11/2023 12:36 PM   Modules accepted: Orders

## 2023-05-12 LAB — CHLAMYDIA/GONOCOCCUS/TRICHOMONAS, NAA
Chlamydia by NAA: NEGATIVE
Gonococcus by NAA: NEGATIVE
Trich vag by NAA: NEGATIVE

## 2023-05-13 NOTE — Progress Notes (Signed)
 Complex Care Management Note  Care Guide Note 05/13/2023 Name: Richar Dunklee MRN: [id number] DOB: [birth year]  Sandrea Matte Jafri is a 38 y.o. year old male who sees [name], Baird Kay, FNP for primary care.  Jonatha Michelene Gardener called by phone today to schedule referral for complex care management services.  Mr. Throne was given information about Complex Care Management services today including:   The Complex Care Management services include support from the care team which includes your Nurse Care Manager, Clinical Social Worker, or Pharmacist.  The Complex Care Management team is here to help remove barriers to the health concerns and goals most important to you. Complex Care Management services are voluntary, and the patient may decline or stop services at any time by request to their care team member.   Complex Care Management Consent Status: Patient agreed to services and verbal consent obtained.   Follow up plan:  Telephone appointment with complex care management team member scheduled for:  4/15  Encounter Outcome:  Patient Scheduled  Gwenevere Ghazi  [facility]  [facility], [facility] Guide  Direct Dial: [phone]  Fax [phone]

## 2023-05-13 NOTE — Progress Notes (Signed)
 Complex Care Management Note Care Guide Note  05/13/2023 Name: Danny Mccoy MRN: [id number] DOB: Sep 16, 1985   Complex Care Management Outreach Attempts: A second unsuccessful outreach was attempted today to offer the patient with information about available complex care management services.  Follow Up Plan:  Additional outreach attempts will be made to offer the patient complex care management information and services.   Encounter Outcome:  No Answer  Gwenevere Ghazi  [facility]  [facility], [facility] Guide  Direct Dial: [phone]  Fax [phone]

## 2023-05-22 ENCOUNTER — Emergency Department (HOSPITAL_COMMUNITY)
Admission: EM | Admit: 2023-05-22 | Discharge: 2023-05-23 | Discharge status: Against Medical Advice | Attending: Emergency Medicine | Admitting: Emergency Medicine

## 2023-05-22 ENCOUNTER — Encounter (HOSPITAL_COMMUNITY): Payer: Self-pay

## 2023-05-22 ENCOUNTER — Other Ambulatory Visit: Payer: Self-pay

## 2023-05-22 DIAGNOSIS — Z5321 Procedure and treatment not carried out due to patient leaving prior to being seen by health care provider: Secondary | ICD-10-CM | POA: Insufficient documentation

## 2023-05-22 DIAGNOSIS — R799 Abnormal finding of blood chemistry, unspecified: Secondary | ICD-10-CM | POA: Diagnosis not present

## 2023-05-22 NOTE — ED Triage Notes (Signed)
 Pt POV d/t abnormal labs  on STDs he had done here. All in chart.

## 2023-05-24 ENCOUNTER — Telehealth: Payer: Self-pay

## 2023-05-24 NOTE — Transitions of Care (Post Inpatient/ED Visit) (Signed)
   05/24/2023  Name: Damond Borchers MRN: [id number] DOB: Jul 26, 1985  Today's TOC FU Call Status: Today's TOC FU Call Status:: Unsuccessful Call (1st Attempt) Unsuccessful Call (1st Attempt) Date: 05/24/23  Attempted to reach the patient regarding the most recent Inpatient/ED visit.  Follow Up Plan: Additional outreach attempts will be made to reach the patient to complete the Transitions of Care (Post Inpatient/ED visit) call.   Signature  [employer], [place]

## 2023-05-25 ENCOUNTER — Encounter: Payer: Self-pay | Admitting: Licensed Clinical Social Worker

## 2023-05-26 ENCOUNTER — Telehealth: Payer: Self-pay

## 2023-05-26 NOTE — Transitions of Care (Post Inpatient/ED Visit) (Cosign Needed)
 05/26/2023  Name: Danny Mccoy MRN: [id number] DOB: 1985/05/02  Today's TOC FU Call Status: Today's TOC FU Call Status:: Successful TOC FU Call Completed TOC FU Call Complete Date: 05/26/23 Patient's Name and Date of Birth confirmed.  Transition Care Management Follow-up Telephone Call Date of Discharge: 05/23/23 Discharge Facility: [facility]) Type of Discharge: Emergency Department Reason for ED Visit: Other: How have you been since you were released from the hospital?: Better Any questions or concerns?: No  Items Reviewed: Did you receive and understand the discharge instructions provided?: No Medications obtained,verified, and reconciled?: Yes (Medications Reviewed) Any new allergies since your discharge?: No Dietary orders reviewed?: NA Do you have support at home?: No  Medications Reviewed Today: Medications Reviewed Today     Reviewed by Danny Mccoy, RMA (Registered Medical Assistant) on 05/26/23 at 1626  Med List Status: <None>   Medication Order Taking? Sig Documenting Provider Last Dose Status Informant  benzonatate (TESSALON) 100 MG capsule [id number] No Take 1 capsule (100 mg total) by mouth 3 (three) times daily as needed for cough.  Patient not taking: Reported on 05/26/2023   Danny Keto, MD Not Taking Active   buPROPion (WELLBUTRIN SR) 150 MG 12 hr tablet [id number] Yes Take 150 mg once daily in the morning; after 3 days increase to  150 mg twice daily [name], [name], FNP Taking Active   hydrOXYzine (VISTARIL) 25 MG capsule [id number] Yes Take 1 capsule (25 mg total) by mouth every 8 (eight) hours as needed. [name], [name], FNP Taking Active   ibuprofen (ADVIL) 800 MG tablet [id number] No Take 1 tablet (800 mg total) by mouth every 8 (eight) hours as needed (pain).  Patient not taking: Reported on 05/26/2023   Danny Keto, MD Not Taking Active   ipratropium (ATROVENT) 0.03 % nasal spray [id number] No Place 1 spray into both  nostrils 2 (two) times daily as needed for rhinitis (congestion).  Patient not taking: Reported on 05/26/2023   Danny Ensign, NP Not Taking Active   naproxen (NAPROSYN) 375 MG tablet [id number] No Take 1 tablet (375 mg total) by mouth 2 (two) times daily.  Patient not taking: Reported on 05/26/2023   Kommor, Alyse July, MD Not Taking Active   pseudoephedrine-guaifenesin [facility] D) 60-600 MG 12 hr tablet [id number] No Take 1 tablet by mouth every 12 (twelve) hours.  Patient not taking: Reported on 05/26/2023   Danny Ensign, NP Not Taking Active   tadalafil (CIALIS) 5 MG tablet [id number] Yes Take 1 tablet (5 mg total) by mouth daily. [name], [name], FNP Taking Active             Home Care and Equipment/Supplies: Were Home Health Services Ordered?: NA Any new equipment or medical supplies ordered?: NA  Functional Questionnaire: Do you need assistance with bathing/showering or dressing?: No Do you need assistance with meal preparation?: No Do you need assistance with eating?: No Do you have difficulty maintaining continence: No Do you need assistance with getting out of bed/getting out of a chair/moving?: No Do you have difficulty managing or taking your medications?: No  Follow up appointments reviewed: PCP Follow-up appointment confirmed?: NA Specialist Hospital Follow-up appointment confirmed?: NA Do you need transportation to your follow-up appointment?: No Do you understand care options if your condition(s) worsen?: Yes-patient verbalized understanding  SDOH Interventions Today    Flowsheet Row Most Recent Value  SDOH Interventions   Food Insecurity Interventions Intervention Not Indicated  Housing Interventions Community Resources Provided  [  pt made an appt.]       SIGNATURE. Danny Mccoy RMA

## 2023-06-01 ENCOUNTER — Other Ambulatory Visit: Payer: Self-pay | Admitting: Nurse Practitioner

## 2023-06-01 DIAGNOSIS — F331 Major depressive disorder, recurrent, moderate: Secondary | ICD-10-CM

## 2023-06-02 ENCOUNTER — Telehealth (INDEPENDENT_AMBULATORY_CARE_PROVIDER_SITE_OTHER): Payer: Self-pay | Admitting: Nurse Practitioner

## 2023-06-02 ENCOUNTER — Other Ambulatory Visit: Payer: Self-pay | Admitting: Licensed Clinical Social Worker

## 2023-06-02 DIAGNOSIS — F321 Major depressive disorder, single episode, moderate: Secondary | ICD-10-CM

## 2023-06-02 DIAGNOSIS — F411 Generalized anxiety disorder: Secondary | ICD-10-CM

## 2023-06-02 NOTE — Patient Outreach (Signed)
 Complex Care Management   Visit Note  06/02/2023  Name:  Danny Mccoy MRN: [id number] DOB: July 13, 1985  Situation: Referral received for Complex Care Management related to Menta/Behavioral Health diagnosis anxiety  I obtained verbal consent from Patient.  Visit completed with Silverio Drought   on the phone  Background:   Past Medical History:  Diagnosis Date   Anxiety    Asthma    Depression    ESBL (extended spectrum beta-lactamase) producing bacteria infection 10/16/2022   HSV-1 infection    Hypertension    UTI (urinary tract infection) 10/15/2022    Assessment: Patient Reported Symptoms:  Cognitive        Neurological      HEENT        Cardiovascular      Respiratory      Endocrine      Gastrointestinal        Genitourinary      Integumentary      Musculoskeletal          Psychosocial       Do you feel physically threatened by others?: No      06/02/2023   10:07 AM  Depression screen PHQ 2/9  Decreased Interest 3  Down, Depressed, Hopeless 3  PHQ - 2 Score 6  Altered sleeping 1  Tired, decreased energy 3  Change in appetite 0  Feeling bad or failure about yourself  3  Trouble concentrating 0  Moving slowly or fidgety/restless 0  Suicidal thoughts 0  PHQ-9 Score 13    There were no vitals filed for this visit.  Medications Reviewed Today     Reviewed by Fletcher Humble, LCSW (Social Worker) on 06/02/23 at 1457  Med List Status: <None>   Medication Order Taking? Sig Documenting Provider Last Dose Status Informant  benzonatate  (TESSALON ) 100 MG capsule [id number] No Take 1 capsule (100 mg total) by mouth 3 (three) times daily as needed for cough.  Patient not taking: Reported on 05/10/2023   Ann Keto, MD Unknown Active   buPROPion  (WELLBUTRIN  SR) 150 MG 12 hr tablet [id number] Yes TAKE 150 MG ONCE DAILY IN THE MORNING AFTER 3 DAYS INCREASE TO 150 MG TWICE DAILY [name], [name], FNP Taking Active   hydrOXYzine  (VISTARIL ) 25 MG  capsule [id number] Yes Take 1 capsule (25 mg total) by mouth every 8 (eight) hours as needed. [name], [name], FNP Taking Active   ibuprofen  (ADVIL ) 800 MG tablet [id number] No Take 1 tablet (800 mg total) by mouth every 8 (eight) hours as needed (pain).  Patient not taking: Reported on 05/10/2023   Banister, Pamela K, MD Unknown Active   ipratropium (ATROVENT ) 0.03 % nasal spray [id number]  Place 1 spray into both nostrils 2 (two) times daily as needed for rhinitis (congestion).  Patient not taking: Reported on 05/26/2023   [name], NP  Active   naproxen  (NAPROSYN ) 375 MG tablet [id number] No Take 1 tablet (375 mg total) by mouth 2 (two) times daily.  Patient not taking: Reported on 05/10/2023   Kommor, Alyse July, MD Unknown Active   pseudoephedrine -guaifenesin  (MUCINEX  D) 60-600 MG 12 hr tablet [id number] No Take 1 tablet by mouth every 12 (twelve) hours.  Patient not taking: Reported on 05/10/2023   [name], NP Unknown Active   tadalafil  (CIALIS ) 5 MG tablet [id number] No Take 1 tablet (5 mg total) by mouth daily. [name], [name], FNP Unknown Active  Recommendation:   Engage in therapy with provider of your choosing. Engage in self care options to reduce anxiety  Follow Up Plan:   Telephone follow up appointment date/time:  06/23/2023  Fletcher Humble MSW, LCSW Licensed Clinical Social Worker  [facility], Population Health Direct Dial: [phone]  Fax: [phone]

## 2023-06-02 NOTE — Patient Instructions (Signed)
 Visit Information  Thank you for taking time to visit with me today. Please don't hesitate to contact me if I can be of assistance to you before our next scheduled appointment.  Your next care management appointment is by telephone on 06/23/2023 at 1130am  Telephone follow up appointment date/time:  06/23/2023 1130am  Please call the care guide team at [phone] if you need to cancel, schedule, or reschedule an appointment.   Please call 911 if you are experiencing a Mental Health or Behavioral Health Crisis or need someone to talk to.  Fletcher Humble MSW, LCSW Licensed Clinical Social Worker  [facility], Population Health Direct Dial: [phone]  Fax: [phone]

## 2023-06-04 NOTE — Progress Notes (Signed)
 No show

## 2023-06-10 ENCOUNTER — Telehealth: Payer: Self-pay | Admitting: Nurse Practitioner

## 2023-06-10 ENCOUNTER — Other Ambulatory Visit: Payer: Self-pay | Admitting: Nurse Practitioner

## 2023-06-10 DIAGNOSIS — F331 Major depressive disorder, recurrent, moderate: Secondary | ICD-10-CM

## 2023-06-10 DIAGNOSIS — F411 Generalized anxiety disorder: Secondary | ICD-10-CM
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
## 2023-06-11 ENCOUNTER — Other Ambulatory Visit: Payer: Self-pay | Admitting: Nurse Practitioner

## 2023-06-11 DIAGNOSIS — F411 Generalized anxiety disorder: Secondary | ICD-10-CM

## 2023-06-11 DIAGNOSIS — F331 Major depressive disorder, recurrent, moderate: Secondary | ICD-10-CM

## 2023-06-11 MED ORDER — BUPROPION HCL ER (SR) 150 MG PO TB12: ORAL_TABLET | ORAL | 1 refills | Status: DC

## 2023-06-11 MED ORDER — HYDROXYZINE PAMOATE 25 MG PO CAPS: 25.0000 mg | ORAL_CAPSULE | Freq: Three times a day (TID) | ORAL | 1 refills | Status: DC | PRN

## 2023-06-11 NOTE — Telephone Encounter (Signed)
 Please advise [facility]

## 2023-06-17 ENCOUNTER — Ambulatory Visit (HOSPITAL_COMMUNITY)
Admission: EM | Admit: 2023-06-17 | Discharge: 2023-06-17 | Disposition: A | Discharge status: Home / Self Care | Attending: Internal Medicine | Admitting: Internal Medicine

## 2023-06-17 ENCOUNTER — Encounter (HOSPITAL_COMMUNITY): Payer: Self-pay

## 2023-06-17 DIAGNOSIS — R369 Urethral discharge, unspecified: Secondary | ICD-10-CM | POA: Diagnosis not present

## 2023-06-17 DIAGNOSIS — Z7251 High risk heterosexual behavior: Secondary | ICD-10-CM | POA: Diagnosis not present

## 2023-06-17 DIAGNOSIS — Z113 Encounter for screening for infections with a predominantly sexual mode of transmission: Secondary | ICD-10-CM | POA: Diagnosis not present

## 2023-06-17 MED ORDER — DOXYCYCLINE HYCLATE 100 MG PO CAPS: 100.0000 mg | ORAL_CAPSULE | Freq: Two times a day (BID) | ORAL | 0 refills | Status: DC

## 2023-06-17 NOTE — Discharge Instructions (Signed)
 We will call you if your tests results come back positive  You need wo make sure you wear condoms every time you have sex with women if you want to prevent STD's

## 2023-06-17 NOTE — ED Provider Notes (Signed)
 [facility]    CSN: [id number] Arrival date & time: 06/17/23  1956      History   Chief Complaint Chief Complaint  Patient presents with   STD testing    HPI Danny Mccoy is a 38 y.o. male who presents requesting STD testing due to having clear penile discharge x 1 yesterday. Has sex with unknown woman 2 days ago. He admits he did not wear a condom. He admits having oral sex with her. Yesterday he developed a ST, but has resolved today. Denies URI symptoms.     Past Medical History:  Diagnosis Date   Anxiety    Asthma    Depression    ESBL (extended spectrum beta-lactamase) producing bacteria infection 10/16/2022   HSV-1 infection    Hypertension    UTI (urinary tract infection) 10/15/2022    Patient Active Problem List   Diagnosis Date Noted   GAD (generalized anxiety disorder) 05/10/2023   Screening examination for STI 05/10/2023   Dysuria 05/10/2023   High risk heterosexual behavior 05/10/2023   Erectile dysfunction 05/10/2023   Cloudy urine 01/11/2023   Hospital discharge follow-up 10/28/2022   Incomplete right bundle branch block 10/28/2022   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 Authorizing Provider  doxycycline  (VIBRAMYCIN ) 100 MG capsule Take 1 capsule (100 mg total) by mouth 2 (two) times daily. 06/17/23  Yes Rodriguez-Southworth, Samona Chihuahua, PA-C  buPROPion  (WELLBUTRIN  SR) 150 MG 12 hr tablet Take 150 mg once  daily in the morning; after 3 days increase to  150 mg twice daily 06/11/23   [name], [name], FNP  hydrOXYzine  (VISTARIL ) 25 MG capsule Take 1 capsule (25 mg total) by mouth every 8 (eight) hours as needed. 06/11/23   [name], [name], FNP  tadalafil  (CIALIS ) 5 MG tablet Take 1 tablet (5 mg total) by mouth daily. 05/10/23   [name], [name], FNP    Family History Family History  Problem Relation Age of Onset   Healthy Mother    Diabetes Mother    Healthy Father    Diabetes Father    Breast cancer Maternal Grandmother    Prostate cancer Neg Hx    Colon cancer Neg Hx     Social History Social History   Tobacco Use   Smoking status: Never   Smokeless tobacco: Never   Tobacco comments:    black and mild 2x/week  Vaping Use   Vaping status: Never Used  Substance Use Topics   Alcohol use: Not Currently    Alcohol/week: 1.0 standard drink of alcohol    Types: 1 Cans of beer per week   Drug use: Never     Allergies   Patient has no known allergies.   Review of Systems Review of Systems As noted in HPI  Physical Exam Triage Vital Signs ED Triage Vitals [06/17/23 2012]  Encounter Vitals Group     BP 116/75     Systolic BP Percentile  Diastolic BP Percentile      Pulse Rate 76     Resp 16     Temp 98.4 F (36.9 C)     Temp Source Oral     SpO2 96 %     Weight      Height      Head Circumference      Peak Flow      Pain Score 0     Pain Loc      Pain Education      Exclude from Growth Chart    No data found.  Updated Vital Signs BP 116/75 (BP Location: Left Arm)   Pulse 76   Temp 98.4 F (36.9 C) (Oral)   Resp 16   SpO2 96%   Visual Acuity Right Eye Distance:   Left Eye Distance:   Bilateral Distance:    Right Eye Near:   Left Eye Near:    Bilateral Near:     Physical Exam Vitals and nursing note reviewed.  Constitutional:      General: He is not in acute distress.    Appearance: He is not toxic-appearing.  HENT:     Right Ear:  External ear normal.     Left Ear: External ear normal.     Mouth/Throat:     Pharynx: Oropharynx is clear.  Eyes:     General: No scleral icterus.    Conjunctiva/sclera: Conjunctivae normal.  Pulmonary:     Effort: Pulmonary effort is normal.  Musculoskeletal:        General: Normal range of motion.     Cervical back: Neck supple.  Lymphadenopathy:     Cervical: No cervical adenopathy.  Skin:    General: Skin is warm and dry.  Neurological:     Mental Status: He is alert and oriented to person, place, and time.     Gait: Gait normal.  Psychiatric:        Mood and Affect: Mood normal.        Behavior: Behavior normal.        Thought Content: Thought content normal.        Judgment: Judgment normal.      UC Treatments / Results  Labs (all labs ordered are listed, but only abnormal results are displayed) Labs Reviewed  CYTOLOGY, (ORAL, ANAL, URETHRAL) ANCILLARY ONLY    EKG   Radiology No results found.  Procedures Procedures (including critical care time)  Medications Ordered in UC Medications - No data to display  Initial Impression / Assessment and Plan / UC Course  I have reviewed the triage vital signs and the nursing notes.  Pertinent labs  results that were available during my care of the patient were reviewed by me and considered in my medical decision making (see chart for details).  Penile discharge STD screen  He was placed on Doxy as noted GC/Chlamydia and Trich ordered and we will inform him if positive.    Final Clinical Impressions(s) / UC Diagnoses   Final diagnoses:  Routine screening for STI (sexually transmitted infection)  Penile discharge     Discharge Instructions      We will call you if your tests results come back positive  You need wo make sure you wear condoms every time you have sex with women if you want to prevent STD's   ED Prescriptions     Medication Sig Dispense Auth. Provider   doxycycline  (VIBRAMYCIN ) 100 MG  capsule Take 1 capsule (100 mg total)  by mouth 2 (two) times daily. 20 capsule Rodriguez-Southworth, Tristyn Demarest, PA-C      PDMP not reviewed this encounter.   Vonda Guadeloupe, Kirby Peoples 06/17/23 2042

## 2023-06-17 NOTE — ED Triage Notes (Signed)
 Patient is requesting STD testing. Patient states he has clear penile drainage. Patient states he is not feeling good and states he feels like he is getting a virus.  After questioning patient stated he had a sore throat yesterday, but it went away.

## 2023-06-18 LAB — CYTOLOGY, (ORAL, ANAL, URETHRAL) ANCILLARY ONLY
Chlamydia: NEGATIVE
Comment: NEGATIVE
Comment: NEGATIVE
Comment: NORMAL
Neisseria Gonorrhea: NEGATIVE
Trichomonas: NEGATIVE

## 2023-06-23 ENCOUNTER — Other Ambulatory Visit: Payer: Self-pay | Admitting: Licensed Clinical Social Worker

## 2023-06-23 ENCOUNTER — Other Ambulatory Visit: Payer: Self-pay | Admitting: Nurse Practitioner

## 2023-06-23 DIAGNOSIS — F411 Generalized anxiety disorder: Secondary | ICD-10-CM

## 2023-06-23 DIAGNOSIS — F331 Major depressive disorder, recurrent, moderate: Secondary | ICD-10-CM

## 2023-06-23 NOTE — Patient Outreach (Addendum)
 Complex Care Management   Visit Note  06/23/2023  Name:  Danny Mccoy MRN: [id number] DOB: October 28, 1985  Situation: Referral received for Complex Care Management related to Menta/Behavioral Health diagnosis depression I obtained verbal consent from Patient.  Visit completed with Silverio Drought  on the phone Message send to pt pcp regarding medication instructions. PCP placed referral to psychiatry. Dental providers send to pt via mychart message.  Background:   Past Medical History:  Diagnosis Date   Anxiety    Asthma    Depression    ESBL (extended spectrum beta-lactamase) producing bacteria infection 10/16/2022   HSV-1 infection    Hypertension    UTI (urinary tract infection) 10/15/2022    Assessment: Patient Reported Symptoms:  Cognitive Cognitive Status: Alert and oriented to person, place, and time, Able to follow simple commands      Neurological Neurological Review of Symptoms: No symptoms reported    HEENT HEENT Symptoms Reported: No symptoms reported      Cardiovascular Cardiovascular Symptoms Reported: No symptoms reported    Respiratory Respiratory Symptoms Reported: No symptoms reported    Endocrine Patient reports the following symptoms related to hypoglycemia or hyperglycemia : No symptoms reported    Gastrointestinal Gastrointestinal Symptoms Reported: No symptoms reported      Genitourinary Genitourinary Symptoms Reported: No symptoms reported    Integumentary      Musculoskeletal Musculoskelatal Symptoms Reviewed: No symptoms reported        Psychosocial Psychosocial Symptoms Reported: No symptoms reported     Do you feel physically threatened by others?: No      06/23/2023    4:44 PM  Depression screen PHQ 2/9  Decreased Interest 1  Down, Depressed, Hopeless 2  PHQ - 2 Score 3  Altered sleeping 1  Tired, decreased energy 1  Change in appetite 0  Feeling bad or failure about yourself  1  Trouble concentrating 0  Moving slowly or  fidgety/restless 0  Suicidal thoughts 0  PHQ-9 Score 6    There were no vitals filed for this visit.  Medications Reviewed Today     Reviewed by Fletcher Humble, LCSW (Social Worker) on 06/23/23 at 1637  Med List Status: <None>   Medication Order Taking? Sig Documenting Provider Last Dose Status Informant  buPROPion  (WELLBUTRIN  SR) 150 MG 12 hr tablet [id number]  Take 150 mg once daily in the morning; after 3 days increase to  150 mg twice daily [name], [name], FNP  Active   doxycycline  (VIBRAMYCIN ) 100 MG capsule [id number]  Take 1 capsule (100 mg total) by mouth 2 (two) times daily. Rodriguez-Southworth, Lamond Pilot, PA-C  Active   hydrOXYzine  (VISTARIL ) 25 MG capsule [id number]  Take 1 capsule (25 mg total) by mouth every 8 (eight) hours as needed. [name], [name], FNP  Active   tadalafil  (CIALIS ) 5 MG tablet [id number] No Take 1 tablet (5 mg total) by mouth daily. [name], [name], FNP Unknown Active             Recommendation:   Referral to: psychiatry   Follow Up Plan:   Referral to psychiatry  Fletcher Humble MSW, LCSW Licensed Clinical Social Worker  [facility], Population Health Direct Dial: [phone]  Fax: [phone]

## 2023-06-24 ENCOUNTER — Telehealth: Payer: Self-pay

## 2023-06-24 ENCOUNTER — Telehealth: Payer: Self-pay | Admitting: *Deleted

## 2023-06-24 DIAGNOSIS — F411 Generalized anxiety disorder: Secondary | ICD-10-CM

## 2023-06-24 NOTE — Progress Notes (Signed)
   06/24/2023  Patient ID: Danny Mccoy, male   DOB: Nov 23, 1985, 38 y.o.   MRN: [id number]  I called Danny Mccoy and not able to reach. I reached a notification from care guide Barnie Bora, NT about a medication question.   Attempted to contact patient for medication management/review. Left HIPAA compliant message for patient to return my call at their convenience.   First attempt for patient outreach. Will follow up with a patient message.  Thank you for allowing pharmacy to be a part of this patient's care.  Alexandria Angel, PharmD Clinical Pharmacist Cell: [phone]

## 2023-06-28 NOTE — Telephone Encounter (Signed)
 Called pt back . No answer lvm for him to call back regarding his questions concerning medication. KH

## 2023-07-05 ENCOUNTER — Emergency Department (HOSPITAL_COMMUNITY)
Admission: EM | Admit: 2023-07-05 | Discharge: 2023-07-06 | Disposition: A | Discharge status: Home / Self Care | Attending: Emergency Medicine | Admitting: Emergency Medicine

## 2023-07-05 ENCOUNTER — Encounter (HOSPITAL_COMMUNITY): Payer: Self-pay

## 2023-07-05 ENCOUNTER — Other Ambulatory Visit: Payer: Self-pay

## 2023-07-05 DIAGNOSIS — J45909 Unspecified asthma, uncomplicated: Secondary | ICD-10-CM | POA: Diagnosis not present

## 2023-07-05 DIAGNOSIS — I10 Essential (primary) hypertension: Secondary | ICD-10-CM | POA: Insufficient documentation

## 2023-07-05 DIAGNOSIS — N342 Other urethritis: Secondary | ICD-10-CM | POA: Diagnosis not present

## 2023-07-05 DIAGNOSIS — N50819 Testicular pain, unspecified: Secondary | ICD-10-CM | POA: Diagnosis present

## 2023-07-05 LAB — URINALYSIS, ROUTINE W REFLEX MICROSCOPIC
Bilirubin Urine: NEGATIVE
Glucose, UA: NEGATIVE mg/dL
Hgb urine dipstick: NEGATIVE
Ketones, ur: 5 mg/dL — AB
Leukocytes,Ua: NEGATIVE
Nitrite: NEGATIVE
Protein, ur: NEGATIVE mg/dL
Specific Gravity, Urine: 1.027 (ref 1.005–1.030)
pH: 6 (ref 5.0–8.0)

## 2023-07-05 NOTE — ED Provider Notes (Incomplete)
 [place] [facility] Provider Note   CSN: [id number] Arrival date & time: 07/05/23  1906     History {Add pertinent medical, surgical, social history, OB history to HPI:1} Chief Complaint  Patient presents with  . Testicle Pain  . Urinary Frequency    Danny Mccoy is a 38 y.o. male.  The history is provided by the patient.  Testicle Pain  Urinary Frequency     Home Medications Prior to Admission medications   Medication Sig Start Date End Date Taking? Authorizing Provider  buPROPion  (WELLBUTRIN  SR) 150 MG 12 hr tablet Take 150 mg once daily in the morning; after 3 days increase to  150 mg twice daily 06/11/23   [name], [name], FNP  doxycycline  (VIBRAMYCIN ) 100 MG capsule Take 1 capsule (100 mg total) by mouth 2 (two) times daily. 06/17/23   Rodriguez-Southworth, Sylvia, PA-C  hydrOXYzine  (VISTARIL ) 25 MG capsule Take 1 capsule (25 mg total) by mouth every 8 (eight) hours as needed. 06/11/23   [name], [name], FNP  tadalafil  (CIALIS ) 5 MG tablet Take 1 tablet (5 mg total) by mouth daily. 05/10/23   [name], [name], FNP      Allergies    Patient has no known allergies.    Review of Systems   Review of Systems  Genitourinary:  Positive for frequency and testicular pain.  All other systems reviewed and are negative.   Physical Exam Updated Vital Signs BP 126/67 (BP Location: Right Arm)   Pulse 83   Temp 98.6 F (37 C) (Oral)   Resp 18   Ht 6' (1.829 m)   Wt 97.5 kg   SpO2 100%   BMI 29.15 kg/m  Physical Exam Vitals and nursing note reviewed.   38 year old male, resting comfortably and in no acute distress. Vital signs are ***. Oxygen saturation is ***%, which is normal. Head is normocephalic and atraumatic. PERRLA, EOMI. Oropharynx is clear. Neck is nontender and supple without adenopathy or JVD. Back is nontender and there is no CVA tenderness. Lungs are clear without rales, wheezes, or rhonchi. Chest is  nontender. Heart has regular rate and rhythm without murmur. Abdomen is soft, flat, nontender without masses or hepatosplenomegaly and peristalsis is normoactive. Extremities have no cyanosis or edema, full range of motion is present. Skin is warm and dry without rash. Neurologic: Mental status is normal, cranial nerves are intact, there are no motor or sensory deficits.  ED Results / Procedures / Treatments   Labs (all labs ordered are listed, but only abnormal results are displayed) Labs Reviewed  URINALYSIS, ROUTINE W REFLEX MICROSCOPIC - Abnormal; Notable for the following components:      Result Value   Ketones, ur 5 (*)    All other components within normal limits    EKG None  Radiology No results found.  Procedures Procedures  {Document cardiac monitor, telemetry assessment procedure when appropriate:1}  Medications Ordered in ED Medications - No data to display  ED Course/ Medical Decision Making/ A&P   {   Click here for ABCD2, HEART and other calculatorsREFRESH Note before signing :1}                              Medical Decision Making Amount and/or Complexity of Data Reviewed Labs: ordered.   ***  {Document critical care time when appropriate:1} {Document review of labs and clinical decision tools ie heart score, Chads2Vasc2 etc:1}  {  Document your independent review of radiology images, and any outside records:1} {Document your discussion with family members, caretakers, and with consultants:1} {Document social determinants of health affecting pt's care:1} {Document your decision making why or why not admission, treatments were needed:1} Final Clinical Impression(s) / ED Diagnoses Final diagnoses:  None    Rx / DC Orders ED Discharge Orders     None

## 2023-07-05 NOTE — ED Triage Notes (Signed)
 Pt was seen at urgent care a few weeks about for STD testing. Pt states that testicles have been hurting for 3 weeks. Pt also complains of urinary frequency but not peeing a lot.

## 2023-07-05 NOTE — ED Provider Notes (Signed)
 [facility] AT [facility] Provider Note   CSN: [id number] Arrival date & time: 07/05/23  1906     History  Chief Complaint  Patient presents with   Testicle Pain   Urinary Frequency    Danny Mccoy is a 38 y.o. male.  The history is provided by the patient.  Testicle Pain  Urinary Frequency  He has history of hypertension, asthma, depression and comes in because of testicular discomfort and urethral discharge.  He states that about 3 weeks ago, he had a sexual encounter without a condom.  He states that it was only oral sex.  Few days after this, he started having urethral discharge as well as dysuria and urinary urgency and frequency.  He did go to an urgent care where he got a prescription for doxycycline , but did not get the prescription filled.  He continues to have a clear urethral discharge and continues to have dysuria and urinary urgency and frequency.  He is also complaining of some mild aching in his testicles but has not noted any testicular swelling.   Home Medications Prior to Admission medications   Medication Sig Start Date End Date Taking? Authorizing Provider  buPROPion  (WELLBUTRIN  SR) 150 MG 12 hr tablet Take 150 mg once daily in the morning; after 3 days increase to  150 mg twice daily 06/11/23   [name], [name], FNP  doxycycline  (VIBRAMYCIN ) 100 MG capsule Take 1 capsule (100 mg total) by mouth 2 (two) times daily. 06/17/23   Rodriguez-Southworth, Sylvia, PA-C  hydrOXYzine  (VISTARIL ) 25 MG capsule Take 1 capsule (25 mg total) by mouth every 8 (eight) hours as needed. 06/11/23   [name], [name], FNP  tadalafil  (CIALIS ) 5 MG tablet Take 1 tablet (5 mg total) by mouth daily. 05/10/23   [name], [name], FNP      Allergies    Patient has no known allergies.    Review of Systems   Review of Systems  Genitourinary:  Positive for frequency and testicular pain.  All other systems reviewed and are negative.   Physical  Exam Updated Vital Signs BP 126/67 (BP Location: Right Arm)   Pulse 83   Temp 98.6 F (37 C) (Oral)   Resp 18   Ht 6' (1.829 m)   Wt 97.5 kg   SpO2 100%   BMI 29.15 kg/m  Physical Exam Vitals and nursing note reviewed.   38 year old male, resting comfortably and in no acute distress. Vital signs are normal. Oxygen saturation is 100%, which is normal. Head is normocephalic and atraumatic. PERRLA, EOMI.  Lungs are clear without rales, wheezes, or rhonchi. Heart has regular rate and rhythm without murmur. Abdomen is soft, flat, nontender. Genitalia: Circumcised penis.  Testes descended without swelling or tenderness.  There is shotty bilateral inguinal adenopathy. Skin is warm and dry without rash. Neurologic: Awake and alert, moves all extremities equally.  ED Results / Procedures / Treatments   Labs (all labs ordered are listed, but only abnormal results are displayed) Labs Reviewed  URINALYSIS, ROUTINE W REFLEX MICROSCOPIC - Abnormal; Notable for the following components:      Result Value   Ketones, ur 5 (*)    All other components within normal limits  RPR  GC/CHLAMYDIA PROBE AMP ([place]) NOT AT [facility]   Procedures Procedures    Medications Ordered in ED Medications  cefTRIAXone  (ROCEPHIN ) injection 500 mg (has no administration in time range)  doxycycline  (VIBRA -TABS) tablet 100 mg (has no  administration in time range)    ED Course/ Medical Decision Making/ A&P                                 Medical Decision Making Amount and/or Complexity of Data Reviewed Labs: ordered.   Urethritis which has not been treated.  I have reviewed his past records, and I note urgent care visit on 06/17/2023 for sexually-transmitted infection testing at which time he was given prescription for doxycycline  which, as noted above, he did not get filled.  Testing at that time was negative for chlamydia, gonorrhea, trichomonas.  I suspect that he has either gonorrhea or chlamydia.  I  have ordered repeat testing.  I discussed with him the need to test for syphilis and HIV, but he refuses stating he had been tested for both of those in March.  On review of his records, he does have negative testing for HIV and negative RPR on 05/10/2023.  I reviewed his laboratory tests, and my interpretation is mild ketonuria but otherwise normal urinalysis.  No evidence of UTI.  I have ordered a dose of ceftriaxone  and I am discharging him with a prescription for doxycycline .  I am referring him to urology if symptoms do not improve with antibiotic treatment.  I have also advised him about the need for safe sex practices.  Final Clinical Impression(s) / ED Diagnoses Final diagnoses:  Urethritis    Rx / DC Orders ED Discharge Orders          Ordered    doxycycline  (VIBRAMYCIN ) 100 MG capsule  2 times daily        07/06/23 0020              Alissa April, MD 07/06/23 [phone]

## 2023-07-06 LAB — GC/CHLAMYDIA PROBE AMP (~~LOC~~) NOT AT ARMC
Chlamydia: NEGATIVE
Comment: NEGATIVE
Comment: NORMAL
Neisseria Gonorrhea: NEGATIVE

## 2023-07-06 MED ORDER — CEFTRIAXONE SODIUM 500 MG IJ SOLR
500.0000 mg | Freq: Once | INTRAMUSCULAR | Status: AC
  Administered 2023-07-06: 500 mg via INTRAMUSCULAR
  Filled 2023-07-06: qty 500

## 2023-07-06 MED ORDER — STERILE WATER FOR INJECTION IJ SOLN
INTRAMUSCULAR | Status: AC
  Filled 2023-07-06: qty 10

## 2023-07-06 MED ORDER — DOXYCYCLINE HYCLATE 100 MG PO TABS
100.0000 mg | ORAL_TABLET | Freq: Once | ORAL | Status: AC
  Administered 2023-07-06: 100 mg via ORAL
  Filled 2023-07-06: qty 1

## 2023-07-06 MED ORDER — DOXYCYCLINE HYCLATE 100 MG PO CAPS: 100.0000 mg | ORAL_CAPSULE | Freq: Two times a day (BID) | ORAL | 0 refills | Status: DC

## 2023-07-06 NOTE — Discharge Instructions (Addendum)
 Even though your recent test was negative for gonorrhea and chlamydia, it is very important that you take the prescribed antibiotic (doxycycline ) until it is completely gone.  If symptoms persist after the course of antibiotics, then follow-up with the urologist.

## 2023-07-08 ENCOUNTER — Telehealth: Admitting: Licensed Clinical Social Worker

## 2023-07-09 ENCOUNTER — Ambulatory Visit: Payer: Self-pay | Admitting: Nurse Practitioner

## 2023-07-09 ENCOUNTER — Telehealth: Payer: Self-pay

## 2023-07-09 ENCOUNTER — Ambulatory Visit (HOSPITAL_COMMUNITY)
Admission: EM | Admit: 2023-07-09 | Discharge: 2023-07-09 | Disposition: A | Discharge status: Home / Self Care | Attending: Emergency Medicine | Admitting: Emergency Medicine

## 2023-07-09 DIAGNOSIS — J069 Acute upper respiratory infection, unspecified: Secondary | ICD-10-CM

## 2023-07-09 LAB — POC COVID19/FLU A&B COMBO
Covid Antigen, POC: NEGATIVE
Influenza A Antigen, POC: NEGATIVE
Influenza B Antigen, POC: NEGATIVE

## 2023-07-09 MED ORDER — GUAIFENESIN ER 600 MG PO TB12: 600.0000 mg | ORAL_TABLET | Freq: Two times a day (BID) | ORAL | 0 refills | Status: DC

## 2023-07-09 MED ORDER — IBUPROFEN 800 MG PO TABS: 800.0000 mg | ORAL_TABLET | Freq: Three times a day (TID) | ORAL | 0 refills | Status: DC

## 2023-07-09 NOTE — ED Provider Notes (Signed)
 [facility]    CSN: [id number] Arrival date & time: 07/09/23  1713      History   Chief Complaint No chief complaint on file.   HPI Danny Mccoy is a 38 y.o. male.   Patient presents to clinic over concern of congestion, rhinorrhea, fatigue and feeling unwell for the past three days.  Cannot breathe through his nose.  Has not had any wheezing or shortness of breath.  Subjective fever, has not measured temperature. Denies fever.   Has been taking Mucinex  and Tylenol .  The history is provided by the patient and medical records.    Past Medical History:  Diagnosis Date   Anxiety    Asthma    Depression    ESBL (extended spectrum beta-lactamase) producing bacteria infection 10/16/2022   HSV-1 infection    Hypertension    UTI (urinary tract infection) 10/15/2022    Patient Active Problem List   Diagnosis Date Noted   GAD (generalized anxiety disorder) 05/10/2023   Screening examination for STI 05/10/2023   Dysuria 05/10/2023   High risk heterosexual behavior 05/10/2023   Erectile dysfunction 05/10/2023   Cloudy urine 01/11/2023   Hospital discharge follow-up 10/28/2022   Incomplete right bundle branch block 10/28/2022   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 Authorizing Provider  guaiFENesin  (MUCINEX ) 600 MG 12 hr tablet Take 1 tablet (600 mg total) by mouth 2 (two) times daily. 07/09/23  Yes Arnelle Nale  N, FNP   ibuprofen  (ADVIL ) 800 MG tablet Take 1 tablet (800 mg total) by mouth 3 (three) times daily. 07/09/23  Yes Saxon Barich  N, FNP  buPROPion  (WELLBUTRIN  SR) 150 MG 12 hr tablet Take 150 mg once daily in the morning; after 3 days increase to  150 mg twice daily 06/11/23   [name], [name], FNP  doxycycline  (VIBRAMYCIN ) 100 MG capsule Take 1 capsule (100 mg total) by mouth 2 (two) times daily. 07/06/23   Alissa April, MD  hydrOXYzine  (VISTARIL ) 25 MG capsule Take 1 capsule (25 mg total) by mouth every 8 (eight) hours as needed. 06/11/23   [name], [name], FNP  tadalafil  (CIALIS ) 5 MG tablet Take 1 tablet (5 mg total) by mouth daily. 05/10/23   [name], [name], FNP    Family History Family History  Problem Relation Age of Onset   Healthy Mother    Diabetes Mother    Healthy Father    Diabetes Father    Breast cancer Maternal Grandmother    Prostate cancer Neg Hx    Colon cancer Neg Hx     Social History Social History   Tobacco Use   Smoking status: Never   Smokeless tobacco: Never   Tobacco comments:    black and mild 2x/week  Vaping Use   Vaping status: Never Used  Substance Use Topics   Alcohol use: Not Currently    Alcohol/week: 1.0 standard drink of alcohol    Types: 1 Cans of beer per  week   Drug use: Never     Allergies   Patient has no known allergies.   Review of Systems Review of Systems  Per HPI  Physical Exam Triage Vital Signs ED Triage Vitals  Encounter Vitals Group     BP 07/09/23 1752 111/63     Systolic BP Percentile --      Diastolic BP Percentile --      Pulse Rate 07/09/23 1752 94     Resp 07/09/23 1752 20     Temp 07/09/23 1752 98.1 F (36.7 C)     Temp Source 07/09/23 1752 Oral     SpO2 07/09/23 1752 98 %     Weight --      Height --      Head Circumference --      Peak Flow --      Pain Score 07/09/23 1753 0     Pain Loc --      Pain Education --      Exclude from Growth Chart --    No data found.  Updated Vital  Signs BP 111/63 (BP Location: Right Arm)   Pulse 94   Temp 98.1 F (36.7 C) (Oral)   Resp 20   SpO2 98%   Visual Acuity Right Eye Distance:   Left Eye Distance:   Bilateral Distance:    Right Eye Near:   Left Eye Near:    Bilateral Near:     Physical Exam Vitals and nursing note reviewed.  Constitutional:      Appearance: Normal appearance.  HENT:     Head: Normocephalic and atraumatic.     Right Ear: External ear normal.     Left Ear: External ear normal.     Nose: Congestion and rhinorrhea present.     Mouth/Throat:     Mouth: Mucous membranes are moist.     Pharynx: Posterior oropharyngeal erythema present.  Eyes:     Conjunctiva/sclera: Conjunctivae normal.  Cardiovascular:     Rate and Rhythm: Normal rate and regular rhythm.     Heart sounds: Normal heart sounds. No murmur heard. Pulmonary:     Effort: Pulmonary effort is normal. No respiratory distress.     Breath sounds: Normal breath sounds.  Skin:    General: Skin is warm and dry.  Neurological:     General: No focal deficit present.     Mental Status: He is alert.  Psychiatric:        Mood and Affect: Mood normal.        Behavior: Behavior is cooperative.      UC Treatments / Results  Labs (all labs ordered are listed, but only abnormal results are displayed) Labs Reviewed  POC COVID19/FLU A&B COMBO    EKG   Radiology No results found.  Procedures Procedures (including critical care time)  Medications Ordered in UC Medications - No data to display  Initial Impression / Assessment and Plan / UC Course  I have reviewed the triage vital signs and the nursing notes.  Pertinent labs & imaging results that were available during my care of the patient were reviewed by me and considered in my medical decision making (see chart for details).  Vitals in triage reviewed, patient is hemodynamically stable.  Lungs are vesicular, heart with regular rate and rhythm.  Congestion, rhinorrhea postnasal  drip present on physical exam, consistent with viral URI.  Symptomatic management discussed.  Plan of care, follow-up care return precautions given, no questions at this  time.  Work note provided.     Final Clinical Impressions(s) / UC Diagnoses   Final diagnoses:  Viral URI     Discharge Instructions      Your symptoms are consistent with a viral upper respiratory tract infection.  Alternate between 800 mg of ibuprofen  and 500 mg of Tylenol  every 4-6 hours.  You can use Mucinex  to break up any secretions.  Sleep with a humidifier and drinking at least 64 ounces of water  cannot further loosen secretions.  Make sure getting plenty of rest.  Viral illnesses typically last around 5 to 7 days in duration.  Return to clinic for any new or urgent symptoms.  ED Prescriptions     Medication Sig Dispense Auth. Provider   guaiFENesin  (MUCINEX ) 600 MG 12 hr tablet Take 1 tablet (600 mg total) by mouth 2 (two) times daily. 30 tablet Harlow Lighter, Travian Kerner  N, FNP   ibuprofen  (ADVIL ) 800 MG tablet Take 1 tablet (800 mg total) by mouth 3 (three) times daily. 30 tablet Harlow Lighter, Drago Hammonds  N, FNP      PDMP not reviewed this encounter.   Harlow Lighter, Rastus Borton  N, FNP 07/09/23 1857

## 2023-07-09 NOTE — Discharge Instructions (Signed)
 Your symptoms are consistent with a viral upper respiratory tract infection.  Alternate between 800 mg of ibuprofen  and 500 mg of Tylenol  every 4-6 hours.  You can use Mucinex  to break up any secretions.  Sleep with a humidifier and drinking at least 64 ounces of water  cannot further loosen secretions.  Make sure getting plenty of rest.  Viral illnesses typically last around 5 to 7 days in duration.  Return to clinic for any new or urgent symptoms.

## 2023-07-09 NOTE — Transitions of Care (Post Inpatient/ED Visit) (Signed)
   07/09/2023  Name: Sequoyah Counterman MRN: [id number] DOB: 1985-03-06  Today's TOC FU Call Status: Today's TOC FU Call Status:: Unsuccessful Call (1st Attempt) Unsuccessful Call (1st Attempt) Date: 07/09/23  Attempted to reach the patient regarding the most recent Inpatient/ED visit.  Follow Up Plan: Additional outreach attempts will be made to reach the patient to complete the Transitions of Care (Post Inpatient/ED visit) call.   Signature  [employer], [place]

## 2023-07-09 NOTE — ED Triage Notes (Signed)
 Pt reports he has a runny nose, fatigue "cannot breathe", and fever x 4 days

## 2023-08-12 ENCOUNTER — Other Ambulatory Visit: Payer: Self-pay | Admitting: Nurse Practitioner

## 2023-08-12 DIAGNOSIS — F411 Generalized anxiety disorder: Secondary | ICD-10-CM

## 2023-08-12 NOTE — Telephone Encounter (Signed)
 Copied from CRM [id number]. Topic: Clinical - Medication Refill >> Aug 12, 2023  4:24 PM Delon T wrote: Medication: hydrOXYzine  (VISTARIL ) 25 MG capsule   Has the patient contacted their pharmacy? Yes (Agent: If no, request that the patient contact the pharmacy for the refill. If patient does not wish to contact the pharmacy document the reason why and proceed with request.) (Agent: If yes, when and what did the pharmacy advise?)  This is the patient's preferred pharmacy:  [facility] [id number] [address] [place], [place] - [address] [place] [address] Phone: [phone] Fax: [phone]    Is this the correct pharmacy for this prescription? Yes If no, delete pharmacy and type the correct one.   Has the prescription been filled recently? Yes  Is the patient out of the medication? Yes  Has the patient been seen for an appointment in the last year OR does the patient have an upcoming appointment? Yes  Can we respond through MyChart? Yes  Agent: Please be advised that Rx refills may take up to 3 business days. We ask that you follow-up with your pharmacy.

## 2023-08-13 ENCOUNTER — Other Ambulatory Visit: Payer: Self-pay | Admitting: Nurse Practitioner

## 2023-08-13 DIAGNOSIS — F411 Generalized anxiety disorder: Secondary | ICD-10-CM

## 2023-08-16 MED ORDER — HYDROXYZINE PAMOATE 25 MG PO CAPS: 25.0000 mg | ORAL_CAPSULE | Freq: Three times a day (TID) | ORAL | 1 refills | Status: DC | PRN

## 2023-08-16 NOTE — Telephone Encounter (Signed)
 Please advise [facility]

## 2023-08-28 DIAGNOSIS — Z202 Contact with and (suspected) exposure to infections with a predominantly sexual mode of transmission: Secondary | ICD-10-CM | POA: Diagnosis not present

## 2023-08-28 DIAGNOSIS — J029 Acute pharyngitis, unspecified: Secondary | ICD-10-CM | POA: Diagnosis not present

## 2023-09-04 ENCOUNTER — Ambulatory Visit (HOSPITAL_COMMUNITY)
Admission: EM | Admit: 2023-09-04 | Discharge: 2023-09-04 | Disposition: A | Discharge status: Home / Self Care | Attending: Family Medicine | Admitting: Family Medicine

## 2023-09-04 ENCOUNTER — Encounter (HOSPITAL_COMMUNITY): Payer: Self-pay

## 2023-09-04 DIAGNOSIS — R369 Urethral discharge, unspecified: Secondary | ICD-10-CM | POA: Insufficient documentation

## 2023-09-04 LAB — POCT URINALYSIS DIP (MANUAL ENTRY)
Bilirubin, UA: NEGATIVE
Blood, UA: NEGATIVE
Glucose, UA: NEGATIVE mg/dL
Nitrite, UA: NEGATIVE
Protein Ur, POC: NEGATIVE mg/dL
Spec Grav, UA: >=1.03 — AB
Urobilinogen, UA: 0.2 U/dL
pH, UA: 5.5

## 2023-09-04 MED ORDER — CEFTRIAXONE SODIUM 500 MG IJ SOLR
INTRAMUSCULAR | Status: AC
  Filled 2023-09-04: qty 500

## 2023-09-04 MED ORDER — CEFTRIAXONE SODIUM 500 MG IJ SOLR
500.0000 mg | Freq: Once | INTRAMUSCULAR | Status: AC
  Administered 2023-09-04: 500 mg via INTRAMUSCULAR

## 2023-09-04 MED ORDER — DOXYCYCLINE HYCLATE 100 MG PO CAPS: 100.0000 mg | ORAL_CAPSULE | Freq: Two times a day (BID) | ORAL | 0 refills | Status: DC

## 2023-09-04 MED ORDER — LIDOCAINE HCL (PF) 1 % IJ SOLN
INTRAMUSCULAR | Status: AC
  Filled 2023-09-04: qty 2

## 2023-09-04 NOTE — Discharge Instructions (Signed)

## 2023-09-04 NOTE — ED Triage Notes (Signed)
 Patient reports that he began having a  clear penile discharge, lower back pain/top of buttocks, and dysuria. Which he describes as slight burning. Patient reports a history of prostatitis and states he does not have an STD. Symptoms began 8 days ago.  Patient denies taking any medication for his pain.

## 2023-09-05 LAB — URINE CULTURE: Culture: NO GROWTH

## 2023-09-06 ENCOUNTER — Ambulatory Visit (HOSPITAL_COMMUNITY): Payer: Self-pay

## 2023-09-06 LAB — CYTOLOGY, (ORAL, ANAL, URETHRAL) ANCILLARY ONLY
Chlamydia: NEGATIVE
Comment: NEGATIVE
Comment: NEGATIVE
Comment: NORMAL
Neisseria Gonorrhea: NEGATIVE
Trichomonas: NEGATIVE

## 2023-09-08 NOTE — ED Provider Notes (Signed)
 [facility]   [id number] 09/04/23 Arrival Time: 1648  ASSESSMENT & PLAN:  1. Penile discharge       Discharge Instructions      You have been given the following today for treatment of suspected gonorrhea and/or chlamydia:  cefTRIAXone  (ROCEPHIN ) injection 500 mg  Please pick up your prescription for doxycycline  100 mg and begin taking twice daily for the next seven (7) days.  Even though we have treated you today, we have sent testing for sexually transmitted infections. We will notify you of any positive results once they are received. If required, we will prescribe any medications you might need.  Please refrain from all sexual activity for at least the next seven days.     Pending: Labs Reviewed  POCT URINALYSIS DIP (MANUAL ENTRY) - Abnormal; Notable for the following components:      Result Value   Ketones, POC UA trace (5) (*)    Spec Grav, UA >=1.030 (*)    Leukocytes, UA Trace (*)    All other components within normal limits  URINE CULTURE  CYTOLOGY, (ORAL, ANAL, URETHRAL) ANCILLARY ONLY    Will notify of any positive results. Instructed to refrain from sexual activity for at least seven days.  Reviewed expectations re: course of current medical issues. Questions answered. Outlined signs and symptoms indicating need for more acute intervention. Patient verbalized understanding. After Visit Summary given.   SUBJECTIVE:  Danny Mccoy is a 38 y.o. male who presents with complaint of penile discharge; clear; over past 7-8 days. Mild dysuria; burning. Otherwise feels well.   OBJECTIVE:  Vitals:   09/04/23 1716  BP: 124/80  Pulse: 89  Resp: (!) 95  Temp: 98.3 F (36.8 C)  TempSrc: Oral  SpO2: 95%    Resp rate above is obv wrong; recheck 14.  General appearance: alert, cooperative, appears stated age and no distress Throat: lips, mucosa, and tongue normal; teeth and gums normal Lungs: unlabored respirations; speaks full  sentences without difficulty Back: no CVA tenderness; FROM at waist Abdomen: soft, non-tender GU: normal appearing genitalia Skin: warm and dry Psychological: alert and cooperative; normal mood and affect.  Results for orders placed or performed during the hospital encounter of 09/04/23  POCT urinalysis dipstick   Collection Time: 09/04/23  5:26 PM  Result Value Ref Range   Color, UA yellow yellow   Clarity, UA clear clear   Glucose, UA negative negative mg/dL   Bilirubin, UA negative negative   Ketones, POC UA trace (5) (A) negative mg/dL   Spec Grav, UA >=8.969 (A) 1.010 - 1.025   Blood, UA negative negative   pH, UA 5.5 5.0 - 8.0   Protein Ur, POC negative negative mg/dL   Urobilinogen, UA 0.2 0.2 or 1.0 E.U./dL   Nitrite, UA Negative Negative   Leukocytes, UA Trace (A) Negative  Urine Culture   Collection Time: 09/04/23  5:33 PM   Specimen: Urine, Clean Catch  Result Value Ref Range   Specimen Description URINE, CLEAN CATCH    Special Requests NONE    Culture      NO GROWTH Performed at [facility], [address] [place], [place] [address]    Report Status 09/05/2023 FINAL   Cytology Ancillary Only -Urethral; GC / Chlamydia, Trichomonas   Collection Time: 09/04/23  6:20 PM  Result Value Ref Range   Neisseria Gonorrhea Negative    Chlamydia Negative    Trichomonas Negative    Comment Normal Reference Range Trichomonas -  Negative    Comment Normal Reference Ranger Chlamydia - Negative    Comment      Normal Reference Range Neisseria Gonorrhea - Negative    Labs Reviewed  POCT URINALYSIS DIP (MANUAL ENTRY) - Abnormal; Notable for the following components:      Result Value   Ketones, POC UA trace (5) (*)    Spec Grav, UA >=1.030 (*)    Leukocytes, UA Trace (*)    All other components within normal limits  URINE CULTURE  CYTOLOGY, (ORAL, ANAL, URETHRAL) ANCILLARY ONLY    No Known Allergies  Past Medical History:  Diagnosis Date   Anxiety     Asthma    Depression    ESBL (extended spectrum beta-lactamase) producing bacteria infection 10/16/2022   HSV-1 infection    Hypertension    UTI (urinary tract infection) 10/15/2022   Family History  Problem Relation Age of Onset   Healthy Mother    Diabetes Mother    Healthy Father    Diabetes Father    Breast cancer Maternal Grandmother    Prostate cancer Neg Hx    Colon cancer Neg Hx    Social History   Socioeconomic History   Marital status: Single    Spouse name: Not on file   Number of children: 1   Years of education: Not on file   Highest education level: Not on file  Occupational History   Not on file  Tobacco Use   Smoking status: Never   Smokeless tobacco: Never   Tobacco comments:    black and mild 2x/week  Vaping Use   Vaping status: Never Used  Substance and Sexual Activity   Alcohol use: Not Currently    Alcohol/week: 1.0 standard drink of alcohol    Types: 1 Cans of beer per week   Drug use: Never   Sexual activity: Yes    Birth control/protection: None  Other Topics Concern   Not on file  Social History Narrative   Homeless. Lives in the car.    Social Drivers of Corporate investment banker Strain: Not on file  Food Insecurity: No Food Insecurity (06/23/2023)   Hunger Vital Sign    Worried About Running Out of Food in the Last Year: Never true    Ran Out of Food in the Last Year: Never true  Transportation Needs: No Transportation Needs (06/23/2023)   PRAPARE - Administrator, Civil Service (Medical): No    Lack of Transportation (Non-Medical): No  Physical Activity: Not on file  Stress: Not on file  Social Connections: Not on file  Intimate Partner Violence: Not At Risk (06/23/2023)   Humiliation, Afraid, Rape, and Kick questionnaire    Fear of Current or Ex-Partner: No    Emotionally Abused: No    Physically Abused: No    Sexually Abused: No           Rolinda Rogue, MD 09/08/23 [phone]

## 2023-09-11 DIAGNOSIS — R300 Dysuria: Secondary | ICD-10-CM | POA: Diagnosis not present

## 2023-09-24 ENCOUNTER — Ambulatory Visit (INDEPENDENT_AMBULATORY_CARE_PROVIDER_SITE_OTHER): Payer: Self-pay | Admitting: Nurse Practitioner

## 2023-09-24 VITALS — BP 118/76 | HR 76 | Temp 97.6°F | Wt 221.2 lb

## 2023-09-24 DIAGNOSIS — R369 Urethral discharge, unspecified: Secondary | ICD-10-CM | POA: Diagnosis not present

## 2023-09-24 DIAGNOSIS — E669 Obesity, unspecified: Secondary | ICD-10-CM | POA: Insufficient documentation

## 2023-09-24 DIAGNOSIS — F419 Anxiety disorder, unspecified: Secondary | ICD-10-CM

## 2023-09-24 DIAGNOSIS — F32A Depression, unspecified: Secondary | ICD-10-CM | POA: Insufficient documentation

## 2023-09-24 MED ORDER — FLUOXETINE HCL 20 MG PO CAPS: 20.0000 mg | ORAL_CAPSULE | Freq: Every day | ORAL | 2 refills | Status: DC

## 2023-09-24 MED ORDER — SEMAGLUTIDE-WEIGHT MANAGEMENT 0.25 MG/0.5ML ~~LOC~~ SOAJ: 0.2500 mg | SUBCUTANEOUS | 0 refills | Status: DC

## 2023-09-24 MED ORDER — BUPROPION HCL ER (SR) 150 MG PO TB12: 150.0000 mg | ORAL_TABLET | Freq: Two times a day (BID) | ORAL | 1 refills | Status: DC

## 2023-09-24 NOTE — Progress Notes (Signed)
 Acute Office Visit  Subjective:     Patient ID: Danny Mccoy, male    DOB: 28-Nov-1985, 38 y.o.   MRN: [id number]  Chief Complaint  Patient presents with   Medical Management of Chronic Issues    Medication refills     Discussed the use of AI scribe software for clinical note transcription with the patient, who gave verbal consent to proceed.  History of Present Illness Danny Mccoy is a 38 year old male  has a past medical history of Anxiety, Asthma, Depression, ESBL (extended spectrum beta-lactamase) producing bacteria infection (10/16/2022), HSV-1 infection, Hypertension, and UTI (urinary tract infection) (10/15/2022). who presents with concerns about weight loss and anxiety management.  He is concerned about weight loss and is interested in medication to assist with this. Despite exercising three days a week and attempting dietary changes, he has not seen success. His diet is inconsistent, often eating out and not cooking at home. Breakfast typically includes a pork tart, piece of cake, and coffee, with water  consumed throughout the day.  He is currently taking hydroxyzine  25 mg as needed for anxiety, which causes drowsiness affecting his work in [employer]. He also takes Wellbutrin  150 mg tablet  but is unsure about the correct dosage, taking it once daily during weekdays. He is confused about the medication instructions. He has not followed up with a therapist due to work schedule conflicts but wants to see one.    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 He reports no longer having penile discharge, which was a symptom associated with the infection. He is still on doxycycline , which he started last week.  No thoughts of self-harm or harm to others. He has a history of multiple sexual partners but is currently abstaining from sexual activity.     Review of Systems  Constitutional:  Negative for appetite  change, chills, fatigue and fever.  HENT:  Negative for congestion, postnasal drip, rhinorrhea and sneezing.   Respiratory:  Negative for cough, shortness of breath and wheezing.   Cardiovascular:  Negative for chest pain, palpitations and leg swelling.  Gastrointestinal:  Negative for abdominal pain, constipation, nausea and vomiting.  Genitourinary:  Negative for difficulty urinating, dysuria, flank pain and frequency.  Musculoskeletal:  Negative for arthralgias, back pain, joint swelling and myalgias.  Skin:  Negative for color change, pallor, rash and wound.  Neurological:  Negative for dizziness, facial asymmetry, weakness, numbness and headaches.  Psychiatric/Behavioral:  Negative for behavioral problems, confusion, self-injury and suicidal ideas.         Objective:    BP 118/76   Pulse 76   Temp 97.6 F (36.4 C) (Oral)   Wt 221 lb 3.2 oz (100.3 kg)   SpO2 98%   BMI 30.00 kg/m    Physical Exam Vitals and nursing note reviewed.  Constitutional:      General: He is not in acute distress.    Appearance: Normal appearance. He is obese. He is not ill-appearing, toxic-appearing or diaphoretic.  Cardiovascular:     Rate and Rhythm: Normal rate and regular rhythm.     Pulses: Normal pulses.     Heart sounds: Normal heart sounds. No murmur heard.    No friction rub. No gallop.  Pulmonary:     Effort: Pulmonary effort is normal. No respiratory distress.     Breath sounds: Normal breath sounds. No stridor. No wheezing, rhonchi or rales.  Chest:     Chest wall:  No tenderness.  Abdominal:     General: There is no distension.     Palpations: Abdomen is soft.     Tenderness: There is no abdominal tenderness. There is no right CVA tenderness, left CVA tenderness or guarding.  Musculoskeletal:        General: No swelling, tenderness, deformity or signs of injury.     Right lower leg: No edema.     Left lower leg: No edema.  Skin:    General: Skin is warm and dry.     Capillary  Refill: Capillary refill takes less than 2 seconds.     Coloration: Skin is not jaundiced or pale.     Findings: No bruising, erythema or lesion.  Neurological:     Mental Status: He is alert and oriented to person, place, and time.     Motor: No weakness.     Gait: Gait normal.  Psychiatric:        Mood and Affect: Mood normal.        Behavior: Behavior normal.        Thought Content: Thought content normal.        Judgment: Judgment normal.     No results found for any visits on 09/24/23.      Assessment & Plan:  Assessment and Plan Assessment & Plan        Problem List Items Addressed This Visit       Other   Anxiety and depression - Primary      09/24/2023    3:48 PM 06/23/2023    4:44 PM 06/02/2023   10:07 AM  Depression screen PHQ 2/9  Decreased Interest 3 1 3   Down, Depressed, Hopeless 3 2 3   PHQ - 2 Score 6 3 6   Altered sleeping 3 1 1   Tired, decreased energy 0 1 3  Change in appetite 3 0 0  Feeling bad or failure about yourself  3 1 3   Trouble concentrating 0 0 0  Moving slowly or fidgety/restless 0 0 0  Suicidal thoughts 0 0 0  PHQ-9 Score 15 6 13   Difficult doing work/chores Extremely dIfficult         09/24/2023    3:52 PM 05/10/2023   12:05 PM 10/28/2022    1:08 PM  GAD 7 : Generalized Anxiety Score  Nervous, Anxious, on Edge 3 3 3   Control/stop worrying 3 3 3   Worry too much - different things 3 3 3   Trouble relaxing 3 3 0  Restless 0 0 2  Easily annoyed or irritable 3 3 3   Afraid - awful might happen 3 3 3   Total GAD 7 Score 18 18 17   Anxiety Difficulty Somewhat difficult Extremely difficult Somewhat difficult   Anxiety and depression managed with Wellbutrin  and hydroxyzine . Wellbutrin  taken consistently. Hydroxyzine  causes drowsiness. Discussed alternative medications and emphasized consistent medication adherence and therapy. - Prescribe Prozac  20mg  daily  - Provided therapist contact information for appointment scheduling - Provide  psychiatrist's contact information for appointment scheduling. - Advise taking Wellbutrin  150mg  twice daily as prescribed. Follow-up in 4 weeks       Relevant Medications   FLUoxetine  (PROZAC ) 20 MG capsule   buPROPion  (WELLBUTRIN  SR) 150 MG 12 hr tablet   Obesity (BMI 30-39.9)   Wt Readings from Last 3 Encounters:  09/24/23 221 lb 3.2 oz (100.3 kg)  07/05/23 214 lb 15.2 oz (97.5 kg)  05/22/23 215 lb (97.5 kg)   Body mass index is 30 kg/m.  Obesity with difficulty losing weight despite lifestyle changes. Discussed Wegovy  for appetite suppression, pending insurance approval. Emphasized lifestyle changes and potential side effects. - Initiate Wegovy  0.25mg  once weekly  pending insurance approval. - Advise dietary changes: 70-80% vegetables and protein, less carbohydrates. - Encourage three healthy meals daily with portion control. - Recommend water  instead of juice or soda. - Advise 30 minutes of moderate to vigorous exercise five days a week. Patient denies personal or family history of MTC or MEN 2.  They denied personal history of pancreatitis.  Patient encouraged to avoid fatty fried foods eat smaller portions of meal to help decrease nausea.  Encouraged to report abdominal pain, nausea, vomiting.  Follow-up in 4 weeks       Relevant Medications   semaglutide -weight management (WEGOVY ) 0.25 MG/0.5ML SOAJ SQ injection   Penile discharge   Penile discharge, resolved Taking doxycycline  100mg  BID . No current discharge. Discussed STD risks and monogamy. - Advise returning for STD testing when lab is open. - Counsel on monogamous relationships to reduce STD risk.       Meds ordered this encounter  Medications   FLUoxetine  (PROZAC ) 20 MG capsule    Sig: Take 1 capsule (20 mg total) by mouth daily.    Dispense:  30 capsule    Refill:  2   semaglutide -weight management (WEGOVY ) 0.25 MG/0.5ML SOAJ SQ injection    Sig: Inject 0.25 mg into the skin once a week.    Dispense:  2  mL    Refill:  0   buPROPion  (WELLBUTRIN  SR) 150 MG 12 hr tablet    Sig: Take 1 tablet (150 mg total) by mouth 2 (two) times daily.    Dispense:  180 tablet    Refill:  1    Return in about 4 weeks (around 10/22/2023), or obesity, for ANXIETY, DEPRESSION.  Casidy Alberta R Jaslyn Bansal, FNP

## 2023-09-24 NOTE — Assessment & Plan Note (Addendum)
    09/24/2023    3:48 PM 06/23/2023    4:44 PM 06/02/2023   10:07 AM  Depression screen PHQ 2/9  Decreased Interest 3 1 3   Down, Depressed, Hopeless 3 2 3   PHQ - 2 Score 6 3 6   Altered sleeping 3 1 1   Tired, decreased energy 0 1 3  Change in appetite 3 0 0  Feeling bad or failure about yourself  3 1 3   Trouble concentrating 0 0 0  Moving slowly or fidgety/restless 0 0 0  Suicidal thoughts 0 0 0  PHQ-9 Score 15 6 13   Difficult doing work/chores Extremely dIfficult         09/24/2023    3:52 PM 05/10/2023   12:05 PM 10/28/2022    1:08 PM  GAD 7 : Generalized Anxiety Score  Nervous, Anxious, on Edge 3 3 3   Control/stop worrying 3 3 3   Worry too much - different things 3 3 3   Trouble relaxing 3 3 0  Restless 0 0 2  Easily annoyed or irritable 3 3 3   Afraid - awful might happen 3 3 3   Total GAD 7 Score 18 18 17   Anxiety Difficulty Somewhat difficult Extremely difficult Somewhat difficult   Anxiety and depression managed with Wellbutrin  and hydroxyzine . Wellbutrin  taken consistently. Hydroxyzine  causes drowsiness. Discussed alternative medications and emphasized consistent medication adherence and therapy. - Prescribe Prozac  20mg  daily  - Provided therapist contact information for appointment scheduling - Provide psychiatrist's contact information for appointment scheduling. - Advise taking Wellbutrin  150mg  twice daily as prescribed. Follow-up in 4 weeks

## 2023-09-24 NOTE — Patient Instructions (Signed)
 1. Anxiety and depression (Primary)  - FLUoxetine  (PROZAC ) 20 MG capsule; Take 1 capsule (20 mg total) by mouth daily.  Dispense: 30 capsule; Refill: 2  2. Obesity (BMI 30-39.9)  - semaglutide -weight management (WEGOVY ) 0.25 MG/0.5ML SOAJ SQ injection; Inject 0.25 mg into the skin once a week.  Dispense: 2 mL; Refill: 0    It is important that you exercise regularly at least 30 minutes 5 times a week as tolerated  Think about what you will eat, plan ahead. Choose  clean, green, fresh or frozen over canned, processed or packaged foods which are more sugary, salty and fatty. 70 to 75% of food eaten should be vegetables and fruit. Three meals at set times with snacks allowed between meals, but they must be fruit or vegetables. Aim to eat over a 12 hour period , example 7 am to 7 pm, and STOP after  your last meal of the day. Drink water ,generally about 64 ounces per day, no other drink is as healthy. Fruit juice is best enjoyed in a healthy way, by EATING the fruit.  Thanks for choosing [facility] we consider it a privelige to serve you.

## 2023-09-24 NOTE — Assessment & Plan Note (Signed)
 Wt Readings from Last 3 Encounters:  09/24/23 221 lb 3.2 oz (100.3 kg)  07/05/23 214 lb 15.2 oz (97.5 kg)  05/22/23 215 lb (97.5 kg)   Body mass index is 30 kg/m.   Obesity with difficulty losing weight despite lifestyle changes. Discussed Wegovy  for appetite suppression, pending insurance approval. Emphasized lifestyle changes and potential side effects. - Initiate Wegovy  0.25mg  once weekly  pending insurance approval. - Advise dietary changes: 70-80% vegetables and protein, less carbohydrates. - Encourage three healthy meals daily with portion control. - Recommend water  instead of juice or soda. - Advise 30 minutes of moderate to vigorous exercise five days a week. Patient denies personal or family history of MTC or MEN 2.  They denied personal history of pancreatitis.  Patient encouraged to avoid fatty fried foods eat smaller portions of meal to help decrease nausea.  Encouraged to report abdominal pain, nausea, vomiting.  Follow-up in 4 weeks

## 2023-09-24 NOTE — Assessment & Plan Note (Signed)
 Penile discharge, resolved Taking doxycycline  100mg  BID . No current discharge. Discussed STD risks and monogamy. - Advise returning for STD testing when lab is open. - Counsel on monogamous relationships to reduce STD risk.

## 2023-09-28 ENCOUNTER — Other Ambulatory Visit (HOSPITAL_COMMUNITY): Payer: Self-pay

## 2023-09-30 ENCOUNTER — Telehealth: Payer: Self-pay

## 2023-09-30 ENCOUNTER — Other Ambulatory Visit: Payer: Self-pay

## 2023-09-30 NOTE — Telephone Encounter (Signed)
 Pharmacy Patient Advocate Encounter  Received notification from [facility] that Prior Authorization for WEGOVY  has been APPROVED from 09/30/2023 to 04/01/2024

## 2023-10-05 ENCOUNTER — Ambulatory Visit (HOSPITAL_COMMUNITY)
Admission: EM | Admit: 2023-10-05 | Discharge: 2023-10-05 | Disposition: A | Discharge status: Home / Self Care | Attending: Internal Medicine | Admitting: Internal Medicine

## 2023-10-05 ENCOUNTER — Other Ambulatory Visit: Payer: Self-pay

## 2023-10-05 ENCOUNTER — Encounter (HOSPITAL_COMMUNITY): Payer: Self-pay | Admitting: Emergency Medicine

## 2023-10-05 DIAGNOSIS — L239 Allergic contact dermatitis, unspecified cause: Secondary | ICD-10-CM

## 2023-10-05 MED ORDER — PREDNISONE 20 MG PO TABS
40.0000 mg | ORAL_TABLET | Freq: Every day | ORAL | 0 refills | Status: AC
Start: 2023-10-05 — End: 2023-10-10

## 2023-10-05 NOTE — ED Provider Notes (Signed)
 [facility]    CSN: [id number] Arrival date & time: 10/05/23  1708      History   Chief Complaint Chief Complaint  Patient presents with   Rash    HPI Danny Mccoy is a 38 y.o. male.   Patient presents to urgent care for evaluation of rash to the bilateral ankles that he first noticed today.  Rash is localized to the bilateral lower legs just above the ankles.  Rash is very itchy, red, and swollen.  No drainage from the rash.  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  He has not changed his laundry detergents recently and denies sick contacts with similar rashes.  He has never had this type of rash in the past.  Denies contact with new body washes, soaps, foods, topical medications.  He has not attempted treatment of symptoms prior to arrival.     Past Medical History:  Diagnosis Date   Anxiety    Asthma    Depression    ESBL (extended spectrum beta-lactamase) producing bacteria infection 10/16/2022   HSV-1 infection    Hypertension    UTI (urinary tract infection) 10/15/2022    Patient Active Problem List   Diagnosis Date Noted   Anxiety and depression 09/24/2023   Obesity (BMI 30-39.9) 09/24/2023   Penile discharge 09/24/2023   GAD (generalized anxiety disorder) 05/10/2023   Screening examination for STI 05/10/2023   Dysuria 05/10/2023   High risk heterosexual behavior 05/10/2023   Erectile dysfunction 05/10/2023   Cloudy urine 01/11/2023   Hospital discharge follow-up 10/28/2022   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 Authorizing Provider  predniSONE  (DELTASONE ) 20 MG tablet Take 2 tablets (40 mg total) by mouth daily with breakfast for 5 days. 10/05/23 10/10/23 Yes Enedelia Dorna HERO, FNP  buPROPion  (WELLBUTRIN  SR) 150 MG 12 hr tablet Take 1 tablet (150 mg total) by mouth 2 (two) times daily. 09/24/23   [name], [name], FNP  doxycycline  (VIBRAMYCIN ) 100 MG capsule Take 1 capsule (100 mg total) by mouth 2 (two) times daily. Patient not taking: Reported on 09/24/2023 09/04/23   Rolinda Rogue, MD  FLUoxetine  (PROZAC ) 20 MG capsule Take 1 capsule (20 mg total) by mouth daily. 09/24/23 09/23/24  [name], [name], FNP  hydrOXYzine  (VISTARIL ) 25 MG capsule Take 1 capsule (25 mg total) by mouth every 8 (eight) hours as needed. 08/16/23   Oley Bascom RAMAN, NP  hydrOXYzine  (VISTARIL ) 25 MG capsule TAKE 1 CAPSULE (25 MG TOTAL) BY MOUTH EVERY 8 (EIGHT) HOURS AS NEEDED. 08/16/23   Oley Bascom RAMAN, NP  semaglutide -weight management (WEGOVY ) 0.25 MG/0.5ML SOAJ SQ injection Inject 0.25 mg into the skin once a week. 09/24/23   [name], [name], FNP  tadalafil  (CIALIS ) 5 MG tablet Take 1 tablet (5 mg total)  by mouth daily. 05/10/23   [name], [name], FNP    Family History Family History  Problem Relation Age of Onset   Healthy Mother    Diabetes Mother    Healthy Father    Diabetes Father    Breast cancer Maternal Grandmother    Prostate cancer Neg Hx    Colon cancer Neg Hx     Social History Social History   Tobacco Use   Smoking status: Never   Smokeless tobacco: Never   Tobacco comments:    black and mild 2x/week  Vaping Use   Vaping status: Never Used  Substance Use Topics   Alcohol use: Not Currently    Alcohol/week: 1.0 standard drink of alcohol    Types: 1  Cans of beer per week   Drug use: Never     Allergies   Patient has no known allergies.   Review of Systems Review of Systems Per HPI  Physical Exam Triage Vital Signs ED Triage Vitals  Encounter Vitals Group     BP 10/05/23 1749 122/70     Girls Systolic BP Percentile --      Girls Diastolic BP Percentile --      Boys Systolic BP Percentile --      Boys Diastolic BP Percentile --      Pulse Rate 10/05/23 1749 86     Resp 10/05/23 1749 18     Temp 10/05/23 1749 98.2 F (36.8 C)     Temp Source 10/05/23 1749 Oral     SpO2 10/05/23 1749 95 %     Weight --      Height --      Head Circumference --      Peak Flow --      Pain Score 10/05/23 1747 0     Pain Loc --      Pain Education --      Exclude from Growth Chart --    No data found.  Updated Vital Signs BP 122/70 (BP Location: Left Arm) Comment (BP Location): large cuff  Pulse 86   Temp 98.2 F (36.8 C) (Oral)   Resp 18   SpO2 95%   Visual Acuity Right Eye Distance:   Left Eye Distance:   Bilateral Distance:    Right Eye Near:   Left Eye Near:    Bilateral Near:     Physical Exam Vitals and nursing note reviewed.  Constitutional:      Appearance: He is not ill-appearing or toxic-appearing.  HENT:     Head: Normocephalic and atraumatic.     Right Ear: Hearing and external ear normal.     Left Ear: Hearing and external ear normal.     Nose: Nose normal.     Mouth/Throat:     Lips: Pink.  Eyes:     General: Lids are normal. Vision grossly intact. Gaze aligned appropriately.     Extraocular Movements: Extraocular movements intact.     Conjunctiva/sclera: Conjunctivae normal.  Pulmonary:     Effort: Pulmonary effort is normal.  Musculoskeletal:     Cervical back: Neck supple.  Skin:    General: Skin is warm and dry.     Capillary Refill: Capillary refill takes less than 2 seconds.     Findings: Rash (Erythematous and pruritic circumferential papular rash to the bilateral lower legs above the  ankles as seen in image below.) present.     Comments: +2 bilateral anterior tibialis pulses.  No rash to the feet.  Rash is localized to the superior ankles at the sock line and boot line.  Neurological:     General: No focal deficit present.     Mental Status: He is alert and oriented to person, place, and time. Mental status is at baseline.     Cranial Nerves: No dysarthria or facial asymmetry.  Psychiatric:        Mood and Affect: Mood normal.        Speech: Speech normal.        Behavior: Behavior normal.        Thought Content: Thought content normal.        Judgment: Judgment normal.    Left ankle   Bilateral ankles    UC Treatments / Results  Labs (all labs ordered are listed, but only abnormal results are displayed) Labs Reviewed - No data to display  EKG   Radiology No results found.  Procedures Procedures (including critical care time)  Medications Ordered in UC Medications - No data to display  Initial Impression / Assessment and Plan / UC Course  I have reviewed the triage vital signs and the nursing notes.  Pertinent labs & imaging results that were available during my care of the patient were reviewed by me and considered in my medical decision making (see chart for details).   1.  Allergic contact dermatitis Presentation consistent with acute hypersensitivity reaction, likely acute allergic reaction/contact dermatitis. Rash is localized. Will treat with steroids (prednisone  burst). Take zyrtec  10mg  daily for itching.  Advised to avoid known and potential allergens. Follow-up with PCP.   Counseled patient on potential for adverse effects with medications prescribed/recommended today, strict ER and return-to-clinic precautions discussed, patient verbalized understanding.    Final Clinical Impressions(s) / UC Diagnoses   Final diagnoses:  Allergic contact dermatitis, unspecified trigger     Discharge Instructions      Presentation  consistent with acute hypersensitivity reaction, likely acute allergic reaction.  No signs of anaphylaxis, HEENT exam stable, lungs clear.  Will treat with steroids,antihistamine (zyrtec  over the counter once nightly until itching/rash is resolved).  Advised to avoid known and potential allergens. Follow-up with PCP.  If you develop any new or worsening symptoms or if your symptoms do not start to improve, please return here or follow-up with your primary care provider. If your symptoms are severe, please go to the emergency room.    ED Prescriptions     Medication Sig Dispense Auth. Provider   predniSONE  (DELTASONE ) 20 MG tablet Take 2 tablets (40 mg total) by mouth daily with breakfast for 5 days. 10 tablet Enedelia Dorna HERO, FNP      PDMP not reviewed this encounter.   Enedelia Dorna HERO, [place] 10/05/23 1905

## 2023-10-05 NOTE — Discharge Instructions (Signed)
 Presentation consistent with acute hypersensitivity reaction, likely acute allergic reaction.  No signs of anaphylaxis, HEENT exam stable, lungs clear.  Will treat with steroids,antihistamine (zyrtec  over the counter once nightly until itching/rash is resolved).  Advised to avoid known and potential allergens. Follow-up with PCP.  If you develop any new or worsening symptoms or if your symptoms do not start to improve, please return here or follow-up with your primary care provider. If your symptoms are severe, please go to the emergency room.

## 2023-10-05 NOTE — ED Triage Notes (Signed)
 Patient has a red, itchy rash to both lower legs/ankles.  This was noticed today.  Denies new detergents.    Patient has not used any medications on this.

## 2023-10-16 ENCOUNTER — Other Ambulatory Visit: Payer: Self-pay | Admitting: Nurse Practitioner

## 2023-10-16 DIAGNOSIS — F419 Anxiety disorder, unspecified: Secondary | ICD-10-CM

## 2023-11-05 ENCOUNTER — Ambulatory Visit: Payer: Self-pay | Admitting: Nurse Practitioner

## 2023-12-13 ENCOUNTER — Ambulatory Visit (HOSPITAL_COMMUNITY)
Admission: EM | Admit: 2023-12-13 | Discharge: 2023-12-13 | Disposition: A | Discharge status: Home / Self Care | Payer: Self-pay | Attending: Emergency Medicine | Admitting: Emergency Medicine

## 2023-12-13 ENCOUNTER — Encounter (HOSPITAL_COMMUNITY): Payer: Self-pay | Admitting: Emergency Medicine

## 2023-12-13 DIAGNOSIS — Z202 Contact with and (suspected) exposure to infections with a predominantly sexual mode of transmission: Secondary | ICD-10-CM | POA: Insufficient documentation

## 2023-12-13 DIAGNOSIS — R369 Urethral discharge, unspecified: Secondary | ICD-10-CM | POA: Insufficient documentation

## 2023-12-13 LAB — HIV ANTIBODY (ROUTINE TESTING W REFLEX): HIV Screen 4th Generation wRfx: NONREACTIVE

## 2023-12-13 MED ORDER — CEFTRIAXONE SODIUM 500 MG IJ SOLR
INTRAMUSCULAR | Status: AC
  Filled 2023-12-13: qty 500

## 2023-12-13 MED ORDER — LIDOCAINE HCL (PF) 1 % IJ SOLN
INTRAMUSCULAR | Status: AC
  Filled 2023-12-13: qty 2

## 2023-12-13 MED ORDER — DOXYCYCLINE HYCLATE 100 MG PO CAPS: 100.0000 mg | ORAL_CAPSULE | Freq: Two times a day (BID) | ORAL | 0 refills | Status: AC

## 2023-12-13 MED ORDER — CEFTRIAXONE SODIUM 500 MG IJ SOLR: 500.0000 mg | INTRAMUSCULAR | Status: DC

## 2023-12-13 NOTE — ED Triage Notes (Addendum)
 Pt reports had unprotected sexual intercourse couple weeks ago. Reports having penile discharge that is clear. And now having burning with urination.

## 2023-12-13 NOTE — ED Provider Notes (Addendum)
 [facility]    CSN: [id number] Arrival date & time: 12/13/23  1931      History   Chief Complaint Chief Complaint  Patient presents with   Penile Discharge    HPI Danny Mccoy is a 38 y.o. male.   Patient presents requesting STD testing.  Patient initially reported that he was exposed to gonorrhea, but then upon discharge stated that he actually was exposed to chlamydia.  Patient states that he has had penile discharge and dysuria for about 2 weeks.  Patient denies any penile lesions, penile/testicular pain or swelling, hematuria, abdominal pain, flank pain, and fever.  The history is provided by the patient and medical records.  Penile Discharge    Past Medical History:  Diagnosis Date   Anxiety    Asthma    Depression    ESBL (extended spectrum beta-lactamase) producing bacteria infection 10/16/2022   HSV-1 infection    Hypertension    UTI (urinary tract infection) 10/15/2022    Patient Active Problem List   Diagnosis Date Noted   Anxiety and depression 09/24/2023   Obesity (BMI 30-39.9) 09/24/2023   Penile discharge 09/24/2023   GAD (generalized anxiety disorder) 05/10/2023   Screening examination for STI 05/10/2023   Dysuria 05/10/2023   High risk heterosexual behavior 05/10/2023   Erectile dysfunction 05/10/2023   Cloudy urine 01/11/2023   Hospital discharge follow-up 10/28/2022   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 Authorizing Provider  doxycycline  (VIBRAMYCIN ) 100 MG capsule Take 1 capsule (100 mg total) by mouth 2 (two) times daily for 7 days. 12/13/23 12/20/23 Yes Jojuan Champney A, NP  buPROPion  (WELLBUTRIN  SR) 150 MG 12 hr tablet Take 1 tablet (150 mg total) by mouth 2 (two) times daily. 09/24/23   [name], [name], FNP  FLUoxetine  (PROZAC ) 20 MG capsule TAKE 1 CAPSULE BY MOUTH EVERY DAY 10/18/23   [name], [name], FNP  hydrOXYzine  (VISTARIL ) 25 MG capsule Take 1 capsule (25 mg total) by mouth every 8 (eight) hours as needed. 08/16/23   Oley Bascom RAMAN, NP  hydrOXYzine  (VISTARIL ) 25 MG capsule TAKE 1 CAPSULE (25 MG TOTAL) BY MOUTH EVERY 8 (EIGHT) HOURS AS NEEDED. 08/16/23   Oley Bascom RAMAN, NP  semaglutide -weight management (WEGOVY ) 0.25 MG/0.5ML SOAJ SQ injection Inject 0.25 mg into the skin once a week. 09/24/23   [name], [name], FNP  tadalafil  (CIALIS ) 5 MG tablet Take 1 tablet (5 mg total) by mouth daily. 05/10/23   [name], [name], FNP    Family History Family History  Problem Relation Age of Onset   Healthy Mother    Diabetes Mother    Healthy Father    Diabetes Father    Breast cancer Maternal Grandmother    Prostate cancer Neg Hx    Colon cancer Neg Hx     Social History Social History   Tobacco Use   Smoking status: Never  Smokeless tobacco: Never   Tobacco comments:    black and mild 2x/week  Vaping Use   Vaping status: Never Used  Substance Use Topics   Alcohol use: Not Currently    Alcohol/week: 1.0 standard drink of alcohol    Types: 1 Cans of beer per week   Drug use: Never     Allergies   Patient has no known allergies.   Review of Systems Review of Systems  Genitourinary:  Positive for penile discharge.   Per HPI  Physical Exam Triage Vital Signs ED Triage Vitals  Encounter Vitals Group     BP 12/13/23 1951 116/75     Girls Systolic BP Percentile --      Girls Diastolic BP Percentile  --      Boys Systolic BP Percentile --      Boys Diastolic BP Percentile --      Pulse Rate 12/13/23 1951 84     Resp 12/13/23 1951 16     Temp 12/13/23 1951 98.8 F (37.1 C)     Temp Source 12/13/23 1951 Oral     SpO2 12/13/23 1951 96 %     Weight --      Height --      Head Circumference --      Peak Flow --      Pain Score 12/13/23 1950 6     Pain Loc --      Pain Education --      Exclude from Growth Chart --    No data found.  Updated Vital Signs BP 116/75 (BP Location: Left Arm)   Pulse 84   Temp 98.8 F (37.1 C) (Oral)   Resp 16   SpO2 96%   Visual Acuity Right Eye Distance:   Left Eye Distance:   Bilateral Distance:    Right Eye Near:   Left Eye Near:    Bilateral Near:     Physical Exam Vitals and nursing note reviewed.  Constitutional:      General: He is awake. He is not in acute distress.    Appearance: Normal appearance. He is well-developed and well-groomed. He is not ill-appearing.  Abdominal:     General: Abdomen is flat. Bowel sounds are normal. There is no distension.     Palpations: Abdomen is soft.     Tenderness: There is no abdominal tenderness. There is no right CVA tenderness, left CVA tenderness, guarding or rebound.  Genitourinary:    Comments: Exam deferred Skin:    General: Skin is warm and dry.  Neurological:     Mental Status: He is alert.  Psychiatric:        Behavior: Behavior is cooperative.      UC Treatments / Results  Labs (all labs ordered are listed, but only abnormal results are displayed) Labs Reviewed  HIV ANTIBODY (ROUTINE TESTING W REFLEX)  RPR  CYTOLOGY, (ORAL, ANAL, URETHRAL) ANCILLARY ONLY    EKG   Radiology No results found.  Procedures Procedures (including critical care time)  Medications Ordered in UC Medications - No data to display   Initial Impression / Assessment and Plan / UC Course  I have reviewed the triage vital signs and the nursing notes.  Pertinent labs & imaging results  that were available during my care of the patient were reviewed by me and considered in my medical decision making (see chart for details).     Patient is overall well-appearing.  Vitals are stable.  GU exam deferred.  Patient perform self swab for STD.  HIV and RPR ordered.  Prescribed doxycycline  for chlamydia coverage.  Discussed follow-up and return precautions Final Clinical Impressions(s) / UC Diagnoses   Final diagnoses:  Penile discharge  Exposure to chlamydia     Discharge Instructions      Start taking doxycycline  twice daily for 7 days for chlamydia coverage. Your results will come back over the next few days and someone will call if results are positive and require treatment. Please refrain from sexual intercourse for at least 7 days after completing any treatment for STDs.   Return here as needed.     ED Prescriptions     Medication Sig Dispense Auth. Provider   doxycycline  (VIBRAMYCIN ) 100 MG capsule Take 1 capsule (100 mg total) by mouth 2 (two) times daily for 7 days. 14 capsule Johnie Flaming A, NP      PDMP not reviewed this encounter.   Johnie Flaming LABOR, NP 12/13/23 2013    Johnie Flaming A, NP 12/13/23 2024

## 2023-12-13 NOTE — Discharge Instructions (Addendum)
 Start taking doxycycline  twice daily for 7 days for chlamydia coverage. Your results will come back over the next few days and someone will call if results are positive and require treatment. Please refrain from sexual intercourse for at least 7 days after completing any treatment for STDs.   Return here as needed.

## 2023-12-14 LAB — CYTOLOGY, (ORAL, ANAL, URETHRAL) ANCILLARY ONLY
Chlamydia: NEGATIVE
Comment: NEGATIVE
Comment: NEGATIVE
Comment: NORMAL
Neisseria Gonorrhea: NEGATIVE
Trichomonas: NEGATIVE

## 2023-12-14 LAB — RPR: RPR Ser Ql: NONREACTIVE

## 2024-01-11 ENCOUNTER — Encounter: Payer: Self-pay | Admitting: Nurse Practitioner

## 2024-01-22 ENCOUNTER — Encounter (HOSPITAL_COMMUNITY): Payer: Self-pay

## 2024-01-22 ENCOUNTER — Other Ambulatory Visit: Payer: Self-pay

## 2024-01-22 ENCOUNTER — Emergency Department (HOSPITAL_COMMUNITY)
Admission: EM | Admit: 2024-01-22 | Discharge: 2024-01-22 | Disposition: A | Discharge status: Home / Self Care | Payer: Self-pay

## 2024-01-22 DIAGNOSIS — Z202 Contact with and (suspected) exposure to infections with a predominantly sexual mode of transmission: Secondary | ICD-10-CM | POA: Insufficient documentation

## 2024-01-22 DIAGNOSIS — R369 Urethral discharge, unspecified: Secondary | ICD-10-CM | POA: Insufficient documentation

## 2024-01-22 LAB — URINALYSIS, ROUTINE W REFLEX MICROSCOPIC
Bilirubin Urine: NEGATIVE
Glucose, UA: NEGATIVE mg/dL
Hgb urine dipstick: NEGATIVE
Ketones, ur: NEGATIVE mg/dL
Leukocytes,Ua: NEGATIVE
Nitrite: NEGATIVE
Protein, ur: NEGATIVE mg/dL
Specific Gravity, Urine: 1.025 (ref 1.005–1.030)
pH: 5 (ref 5.0–8.0)

## 2024-01-22 LAB — RAPID HIV SCREEN (HIV 1/2 AB+AG)
HIV 1/2 Antibodies: NONREACTIVE
HIV-1 P24 Antigen - HIV24: NONREACTIVE

## 2024-01-22 MED ORDER — DOXYCYCLINE HYCLATE 100 MG PO CAPS: 100.0000 mg | ORAL_CAPSULE | Freq: Two times a day (BID) | ORAL | 0 refills | Status: AC

## 2024-01-22 MED ORDER — LIDOCAINE HCL (PF) 1 % IJ SOLN
INTRAMUSCULAR | Status: AC
  Filled 2024-01-22: qty 5

## 2024-01-22 MED ORDER — CEFTRIAXONE SODIUM 500 MG IJ SOLR
500.0000 mg | Freq: Once | INTRAMUSCULAR | Status: AC
  Administered 2024-01-22: 500 mg via INTRAMUSCULAR
  Filled 2024-01-22: qty 500

## 2024-01-22 NOTE — ED Triage Notes (Signed)
 Pt reports penile discharge, tingling, denies dysuria

## 2024-01-22 NOTE — Discharge Instructions (Signed)
 If your symptoms do not resolve, please follow-up with urology.  Do not have sex with anyone until you complete your antibiotics.

## 2024-01-22 NOTE — ED Provider Triage Note (Signed)
 Emergency Medicine Provider Triage Evaluation Note  Danny Mccoy , a 38 y.o. male  was evaluated in triage.  Pt complains of penile discharge, pain in his testicles with ejaculation, dysuria, and increased urinary frequency x 2-3 weeks.  Patient is requesting STD testing as he has a known exposure to chlamydia.  Denies fevers or testicular swelling.  Denies testicular pain without ejaculation.  Patient has experienced prostatitis in the past and states the pain with ejaculation is similar.  Penile discharge is described as clear fluid and denies bleeding.  He denies skin changes, rash, or bumps to the skin in the genital region.  Review of Systems  Positive: Penile discharge, testicular pain with ejaculation, dysuria, increased urinary frequency Negative: Fevers, testicular swelling, genital skin changes  Physical Exam  BP 124/72   Pulse 61   Temp 98.1 F (36.7 C) (Oral)   Resp 16   Ht 6' (1.829 m)   Wt 124.7 kg   SpO2 96%   BMI 37.30 kg/m  Gen:   Awake, no distress   Resp:  Normal effort  MSK:   Moves extremities without difficulty  Other:  Patient refused genital exam.  Medical Decision Making  Medically screening exam initiated at 6:01 PM.  Appropriate orders placed.  Nestor Zeb Rawl was informed that the remainder of the evaluation will be completed by another provider, this initial triage assessment does not replace that evaluation, and the importance of remaining in the ED until their evaluation is complete.   Rosina Almarie LABOR, PA-C 01/22/24 1805

## 2024-01-22 NOTE — ED Triage Notes (Signed)
 Pt reports STD symptoms x2 weeks.

## 2024-01-22 NOTE — ED Provider Notes (Signed)
 [facility] EMERGENCY DEPARTMENT AT [facility] Provider Note   CSN: [id number] Arrival date & time: 01/22/24  1702     Patient presents with: SEXUALLY TRANSMITTED DISEASE   Danny Mccoy is a 38 y.o. male.   HPI  Patient states he has had multiple sex partners here recently.  Endorsing some penile discharge.  Some slight burning when he urinates.  No penile lesions.  Sex with females.  No male to male sex.   Previous medical history reviewed : Patient last seen in outside ED in November 2025.  Was seen because of tingling in fingertips of both hands.  Negative workup.  Seen in November for STD testing.  Negative gonorrhea and chlamydia at that time.      Prior to Admission medications  Medication Sig Start Date End Date Taking? Authorizing Provider  doxycycline  (VIBRAMYCIN ) 100 MG capsule Take 1 capsule (100 mg total) by mouth 2 (two) times daily for 7 days. 01/22/24 01/29/24 Yes Simon Lavonia SAILOR, MD  buPROPion  (WELLBUTRIN  SR) 150 MG 12 hr tablet Take 1 tablet (150 mg total) by mouth 2 (two) times daily. 09/24/23   [name], [name], FNP  FLUoxetine  (PROZAC ) 20 MG capsule TAKE 1 CAPSULE BY MOUTH EVERY DAY 10/18/23   [name], [name], FNP  hydrOXYzine  (VISTARIL ) 25 MG capsule Take 1 capsule (25 mg total) by mouth every 8 (eight) hours as needed. 08/16/23   Oley Bascom RAMAN, NP  hydrOXYzine  (VISTARIL ) 25 MG capsule TAKE 1 CAPSULE (25 MG TOTAL) BY MOUTH EVERY 8 (EIGHT) HOURS AS NEEDED. 08/16/23   Oley Bascom RAMAN, NP  semaglutide -weight management (WEGOVY ) 0.25 MG/0.5ML SOAJ SQ injection Inject 0.25 mg into the skin once a week. 09/24/23   [name], [name], FNP  tadalafil  (CIALIS ) 5 MG tablet Take 1 tablet (5 mg total) by mouth daily. 05/10/23   [name], [name], FNP    Allergies: Patient has no known allergies.    Review of Systems  Updated Vital Signs BP 124/72   Pulse 61   Temp 98.1 F (36.7 C) (Oral)   Resp 16   Ht 6' (1.829 m)   Wt 124.7 kg   SpO2  96%   BMI 37.30 kg/m   Physical Exam  (all labs ordered are listed, but only abnormal results are displayed) Labs Reviewed  URINALYSIS, ROUTINE W REFLEX MICROSCOPIC  RAPID HIV SCREEN (HIV 1/2 AB+AG)  SYPHILIS: RPR W/REFLEX TO RPR TITER AND TREPONEMAL ANTIBODIES, TRADITIONAL SCREENING AND DIAGNOSIS ALGORITHM  GC/CHLAMYDIA PROBE AMP ([place]) NOT AT [facility]    EKG: None  Radiology: No results found.   Procedures   Medications Ordered in the ED  cefTRIAXone  (ROCEPHIN ) injection 500 mg (has no administration in time range)                                    Medical Decision Making Amount and/or Complexity of Data Reviewed Labs: ordered.  Risk Prescription drug management.   Patient states he has had multiple sex partners here recently.  Endorsing some penile discharge.  Some slight burning when he urinates.  No penile lesions.  Sex with females.  No male to male sex.   Previous medical history reviewed : Patient last seen in outside ED in November 2025.  Was seen because of tingling in fingertips of both hands.  Negative workup.  Seen in November for STD testing.  Negative gonorrhea and chlamydia at that  time.   Start patient on ceftriaxone  IM as well as doxycycline .  If symptoms do not resolve recommend follow-up with urology.   Chlamydia and gonorrhea sent for PCR testing.     Final diagnoses:  Possible exposure to STD  Penile discharge    ED Discharge Orders          Ordered    doxycycline  (VIBRAMYCIN ) 100 MG capsule  2 times daily        01/22/24 1850               Simon Lavonia SAILOR, MD 01/22/24 1851

## 2024-01-23 LAB — SYPHILIS: RPR W/REFLEX TO RPR TITER AND TREPONEMAL ANTIBODIES, TRADITIONAL SCREENING AND DIAGNOSIS ALGORITHM: RPR Ser Ql: NONREACTIVE

## 2024-01-24 LAB — GC/CHLAMYDIA PROBE AMP (~~LOC~~) NOT AT ARMC
Chlamydia: NEGATIVE
Comment: NEGATIVE
Comment: NORMAL
Neisseria Gonorrhea: NEGATIVE

## 2024-02-29 ENCOUNTER — Ambulatory Visit: Payer: Self-pay | Admitting: Nurse Practitioner

## 2024-02-29 ENCOUNTER — Encounter: Payer: Self-pay | Admitting: Nurse Practitioner

## 2024-02-29 VITALS — BP 115/61 | HR 69 | Wt 217.0 lb

## 2024-02-29 DIAGNOSIS — R195 Other fecal abnormalities: Secondary | ICD-10-CM | POA: Insufficient documentation

## 2024-02-29 DIAGNOSIS — E669 Obesity, unspecified: Secondary | ICD-10-CM

## 2024-02-29 DIAGNOSIS — H6122 Impacted cerumen, left ear: Secondary | ICD-10-CM | POA: Diagnosis not present

## 2024-02-29 DIAGNOSIS — H6690 Otitis media, unspecified, unspecified ear: Secondary | ICD-10-CM | POA: Insufficient documentation

## 2024-02-29 DIAGNOSIS — F411 Generalized anxiety disorder: Secondary | ICD-10-CM | POA: Diagnosis not present

## 2024-02-29 DIAGNOSIS — H9202 Otalgia, left ear: Secondary | ICD-10-CM

## 2024-02-29 DIAGNOSIS — G4700 Insomnia, unspecified: Secondary | ICD-10-CM | POA: Insufficient documentation

## 2024-02-29 DIAGNOSIS — F331 Major depressive disorder, recurrent, moderate: Secondary | ICD-10-CM | POA: Diagnosis not present

## 2024-02-29 DIAGNOSIS — H6505 Acute serous otitis media, recurrent, left ear: Secondary | ICD-10-CM | POA: Diagnosis not present

## 2024-02-29 MED ORDER — FLUOXETINE HCL 40 MG PO CAPS: 40.0000 mg | ORAL_CAPSULE | Freq: Every day | ORAL | 3 refills | Status: AC

## 2024-02-29 MED ORDER — HYDROXYZINE PAMOATE 25 MG PO CAPS: 25.0000 mg | ORAL_CAPSULE | Freq: Every evening | ORAL | 1 refills | Status: AC | PRN

## 2024-02-29 MED ORDER — SEMAGLUTIDE-WEIGHT MANAGEMENT 0.25 MG/0.5ML ~~LOC~~ SOAJ: 0.2500 mg | SUBCUTANEOUS | 0 refills | Status: AC

## 2024-02-29 MED ORDER — AMOXICILLIN-POT CLAVULANATE 875-125 MG PO TABS: 1.0000 | ORAL_TABLET | Freq: Two times a day (BID) | ORAL | 0 refills | Status: DC

## 2024-02-29 NOTE — Patient Instructions (Addendum)
 OK to use Debrox (peroxide) in the ear to loosen up wax. . Do not use Q-tips as this can impact wax further.     1. Obesity (BMI 30-39.9) (Primary) - semaglutide -weight management (WEGOVY ) 0.25 MG/0.5ML SOAJ SQ injection; Inject 0.25 mg into the skin once a week.  Dispense: 2 mL; Refill: 0  2. Moderate episode of recurrent major depressive disorder (HCC) - FLUoxetine  (PROZAC ) 40 MG capsule; Take 1 capsule (40 mg total) by mouth daily.  Dispense: 90 capsule; Refill: 3 - Ambulatory referral to Psychiatry  3. GAD (generalized anxiety disorder) - FLUoxetine  (PROZAC ) 40 MG capsule; Take 1 capsule (40 mg total) by mouth daily.  Dispense: 90 capsule; Refill: 3 - hydrOXYzine  (VISTARIL ) 25 MG capsule; Take 1 capsule (25 mg total) by mouth at bedtime as needed.  Dispense: 30 capsule; Refill: 1 - Ambulatory referral to Psychiatry  4. Insomnia, unspecified type - hydrOXYzine  (VISTARIL ) 25 MG capsule; Take 1 capsule (25 mg total) by mouth at bedtime as needed.  Dispense: 30 capsule; Refill: 1  5. Impacted cerumen of left ear  6. Left ear pain - amoxicillin -clavulanate (AUGMENTIN ) 875-125 MG tablet; Take 1 tablet by mouth 2 (two) times daily.  Dispense: 20 tablet; Refill: 0    It is important that you exercise regularly at least 30 minutes 5 times a week as tolerated  Think about what you will eat, plan ahead. Choose  clean, green, fresh or frozen over canned, processed or packaged foods which are more sugary, salty and fatty. 70 to 75% of food eaten should be vegetables and fruit. Three meals at set times with snacks allowed between meals, but they must be fruit or vegetables. Aim to eat over a 12 hour period , example 7 am to 7 pm, and STOP after  your last meal of the day. Drink water ,generally about 64 ounces per day, no other drink is as healthy. Fruit juice is best enjoyed in a healthy way, by EATING the fruit.  Thanks for choosing [facility] we consider it a privelige to serve  you.

## 2024-02-29 NOTE — Assessment & Plan Note (Addendum)
" °    02/29/2024    2:46 PM 09/24/2023    3:52 PM 05/10/2023   12:05 PM 10/28/2022    1:08 PM  GAD 7 : Generalized Anxiety Score  Nervous, Anxious, on Edge 3 3  3  3    Control/stop worrying 3 3  3  3    Worry too much - different things 3 3  3  3    Trouble relaxing 3 3  3   0   Restless 2 0  0  2   Easily annoyed or irritable 3 3  3  3    Afraid - awful might happen 3 3  3  3    Total GAD 7 Score 20 18 18 17   Anxiety Difficulty Extremely difficult Somewhat difficult Extremely difficult Somewhat difficult     Data saved with a previous flowsheet row definition   Major depressive disorder On Prozac  and hydroxyzine . No current therapist or psychiatrist due to insurance. - Increase Prozac  to 40 mg daily , continue hydroxyzine  at bedtime as needed - New referral to psychiatrist placed   "

## 2024-02-29 NOTE — Assessment & Plan Note (Signed)
 Flowsheet Row Office Visit from 02/29/2024 in [facility]  PHQ-9 Total Score 13  Increase Prozac  to 40 mg daily continue hydroxyzine  25 mg at bedtime as needed Referral to psychiatrist placed

## 2024-02-29 NOTE — Assessment & Plan Note (Signed)
 Procedure note: ear wax lavage Verbal consent obtained Left ear irrigated with combination of water  and peroxide Wax successfully removed Pt tolerated well No immediate complication noted

## 2024-02-29 NOTE — Progress Notes (Signed)
 "  Established Patient Office Visit  Subjective:  Patient ID: Danny Mccoy, male    DOB: [birth year]  Age: 39 y.o. MRN: [id number]  CC:  Chief Complaint  Patient presents with   Anxiety    depression   Ear Pain    left   Weight Loss    HPI  Discussed the use of AI scribe software for clinical note transcription with the patient, who gave verbal consent to proceed.  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  who presents with weight loss and diarrhea.  He has experienced weight loss and diarrhea for the past three weeks. Diarrhea occurs twice daily with loose stools, but no blood is present. He also has mild nausea but no abdominal pain or vomiting. He reports that he eats a lot but not the right foods, and believes this may have contributed to his weight loss.  He was previously prescribed Wegovy  0.25 mg for weight loss, which he used for one month. Due to a change in insurance, he has been unable to continue the medication.  Regarding mental health, he is currently taking Prozac  20 mg and hydroxyzine  25 mg as needed for sleep. He has stopped taking Wellbutrin  and is not currently seeing a therapist, although he needs to find one. He mentions difficulties with insurance and job-related issues affecting his ability to access mental health services.  He reports an unresolved ear infection. He was prescribed Augmentin  for acute suppurative otitis media on February 11, 2024, but did not complete the course, taking only three to four days' worth of medication. The ear pain, rated as a 6 out of 10, persists on the left side, especially when lying down, although there is no discharge. The pain initially improved with the medication.  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   Assessment & Plan Acute otitis media, left ear Persistent left ear pain due to incomplete antibiotic course. Augmentin  ordered advised to complete full course of the medication      Past Medical History:  Diagnosis Date   Anxiety    Asthma    Depression    ESBL (extended spectrum beta-lactamase) producing bacteria infection 10/16/2022   HSV-1 infection    Hypertension    UTI (urinary tract infection) 10/15/2022    Past Surgical History:  Procedure Laterality Date   FRACTURE SURGERY      Family History  Problem Relation Age of Onset   Healthy Mother    Diabetes Mother    Healthy Father    Diabetes Father    Breast cancer Maternal Grandmother    Prostate cancer Neg Hx    Colon cancer Neg Hx     Social History   Socioeconomic History   Marital status: Single    Spouse name: Not on file   Number of children: 1   Years of education: Not on file   Highest education level: Bachelor's degree (e.g., BA, AB, BS)  Occupational History   Not on file  Tobacco Use   Smoking status: Never   Smokeless tobacco: Never   Tobacco comments:    black and mild 2x/week  Vaping Use   Vaping status: Never Used  Substance and Sexual Activity  Alcohol use: Not Currently    Alcohol/week: 1.0 standard drink of alcohol    Types: 1 Cans of beer per week   Drug use: Never   Sexual activity: Yes    Birth control/protection: None  Other Topics Concern   Not on file  Social History Narrative   Homeless. Lives in the car.    Social Drivers of Health   Tobacco Use: Low Risk (02/29/2024)   Patient History    Smoking Tobacco Use: Never    Smokeless Tobacco Use: Never    Passive Exposure: Not on file  Financial Resource Strain: Low Risk (09/24/2023)   Overall Financial Resource Strain (CARDIA)    Difficulty of Paying Living Expenses: Not very hard   Food Insecurity: No Food Insecurity (02/29/2024)   Epic    Worried About Programme Researcher, Broadcasting/film/video in the Last Year: Never true    Ran Out of Food in the Last Year: Never true  Transportation Needs: No Transportation Needs (02/29/2024)   Epic    Lack of Transportation (Medical): No    Lack of Transportation (Non-Medical): No  Physical Activity: Not on file  Stress: Not on file  Social Connections: Moderately Isolated (09/24/2023)   Social Connection and Isolation Panel    Frequency of Communication with Friends and Family: More than three times a week    Frequency of Social Gatherings with Friends and Family: Never    Attends Religious Services: 1 to 4 times per year    Active Member of Clubs or Organizations: No    Attends Engineer, Structural: Not on file    Marital Status: Never married  Intimate Partner Violence: Not At Risk (02/29/2024)   Epic    Fear of Current or Ex-Partner: No    Emotionally Abused: No    Physically Abused: No    Sexually Abused: No  Depression (PHQ2-9): High Risk (02/29/2024)   Depression (PHQ2-9)    PHQ-2 Score: 13  Alcohol Screen: Low Risk (09/24/2023)   Alcohol Screen    Last Alcohol Screening Score (AUDIT): 0  Housing: Low Risk (02/29/2024)   Epic    Unable to Pay for Housing in the Last Year: No    Number of Times Moved in the Last Year: 0    Homeless in the Last Year: No  Utilities: Not At Risk (02/29/2024)   Epic    Threatened with loss of utilities: No  Health Literacy: Not on file    Outpatient Medications Prior to Visit  Medication Sig Dispense Refill   tadalafil  (CIALIS ) 5 MG tablet Take 1 tablet (5 mg total) by mouth daily. 30 tablet 2   FLUoxetine  (PROZAC ) 20 MG capsule TAKE 1 CAPSULE BY MOUTH EVERY DAY 90 capsule 1   hydrOXYzine  (VISTARIL ) 25 MG capsule Take 1 capsule (25 mg total) by mouth every 8 (eight) hours as needed. 30 capsule 1   buPROPion  (WELLBUTRIN  SR) 150 MG 12 hr tablet Take 1 tablet (150 mg total) by mouth 2 (two) times  daily. (Patient not taking: Reported on 02/29/2024) 180 tablet 1   hydrOXYzine  (VISTARIL ) 25 MG capsule TAKE 1 CAPSULE (25 MG TOTAL) BY MOUTH EVERY 8 (EIGHT) HOURS AS NEEDED. (Patient not taking: Reported on 02/29/2024) 30 capsule 1   semaglutide -weight management (WEGOVY ) 0.25 MG/0.5ML SOAJ SQ injection Inject 0.25 mg into the skin once a week. (Patient not taking: Reported on 02/29/2024) 2 mL 0   No facility-administered medications prior to visit.    Allergies[1]  ROS Review of Systems  Constitutional:  Positive for unexpected weight change. Negative for appetite change, chills, fatigue and fever.  HENT:  Negative for congestion, postnasal drip, rhinorrhea and sneezing.   Respiratory:  Negative for cough, shortness of breath and wheezing.   Cardiovascular:  Negative for chest pain, palpitations and leg swelling.  Gastrointestinal:  Positive for diarrhea and nausea. Negative for abdominal pain, constipation and vomiting.       Loose stools  Genitourinary:  Negative for difficulty urinating, dysuria, flank pain and frequency.  Musculoskeletal:  Negative for arthralgias, back pain, joint swelling and myalgias.  Skin:  Negative for color change, pallor, rash and wound.  Neurological:  Negative for dizziness, facial asymmetry, weakness, numbness and headaches.  Psychiatric/Behavioral:  Positive for sleep disturbance. Negative for behavioral problems, confusion, self-injury and suicidal ideas.       Objective:    Physical Exam Vitals and nursing note reviewed.  Constitutional:      General: He is not in acute distress.    Appearance: Normal appearance. He is not ill-appearing, toxic-appearing or diaphoretic.  HENT:     Right Ear: Tympanic membrane, ear canal and external ear normal. There is no impacted cerumen.     Left Ear: Tympanic membrane, ear canal and external ear normal. There is impacted cerumen.     Mouth/Throat:     Mouth: Mucous membranes are moist.     Pharynx: Oropharynx  is clear. No oropharyngeal exudate or posterior oropharyngeal erythema.  Eyes:     General: No scleral icterus.       Right eye: No discharge.        Left eye: No discharge.     Extraocular Movements: Extraocular movements intact.     Conjunctiva/sclera: Conjunctivae normal.  Cardiovascular:     Rate and Rhythm: Normal rate and regular rhythm.     Pulses: Normal pulses.     Heart sounds: Normal heart sounds. No murmur heard.    No friction rub. No gallop.  Pulmonary:     Effort: Pulmonary effort is normal. No respiratory distress.     Breath sounds: Normal breath sounds. No stridor. No wheezing, rhonchi or rales.  Chest:     Chest wall: No tenderness.  Abdominal:     General: There is no distension.     Palpations: Abdomen is soft.     Tenderness: There is no abdominal tenderness. There is no right CVA tenderness, left CVA tenderness or guarding.  Musculoskeletal:        General: No swelling, tenderness, deformity or signs of injury.     Right lower leg: No edema.     Left lower leg: No edema.  Skin:    General: Skin is warm and dry.     Capillary Refill: Capillary refill takes less than 2 seconds.     Coloration: Skin is not jaundiced or pale.     Findings: No bruising, erythema or lesion.  Neurological:     Mental Status: He is alert and oriented to person, place, and time.     Motor: No weakness.     Coordination: Coordination normal.     Gait: Gait normal.  Psychiatric:        Mood and Affect: Mood normal.        Behavior: Behavior normal.        Thought Content: Thought content normal.        Judgment: Judgment normal.     BP 115/61   Pulse 69   Wt 217 lb (98.4 kg)  SpO2 99%   BMI 29.43 kg/m  Wt Readings from Last 3 Encounters:  02/29/24 217 lb (98.4 kg)  01/22/24 275 lb (124.7 kg)  09/24/23 221 lb 3.2 oz (100.3 kg)    Lab Results  Component Value Date   TSH 2.130 04/07/2021   Lab Results  Component Value Date   WBC 6.1 10/17/2022   HGB 14.5  10/17/2022   HCT 42.6 10/17/2022   MCV 89.1 10/17/2022   PLT 225 10/17/2022   Lab Results  Component Value Date   NA 144 01/12/2023   K 4.4 01/12/2023   CO2 23 01/12/2023   GLUCOSE 80 01/12/2023   BUN 8 01/12/2023   CREATININE 1.14 01/12/2023   BILITOT 1.0 10/17/2022   ALKPHOS 69 10/17/2022   AST 17 10/17/2022   ALT 21 10/17/2022   PROT 6.3 (L) 10/17/2022   ALBUMIN 3.6 10/17/2022   CALCIUM 9.0 01/12/2023   ANIONGAP 8 10/17/2022   EGFR 85 01/12/2023   No results found for: CHOL No results found for: HDL No results found for: LDLCALC No results found for: TRIG No results found for: CHOLHDL Lab Results  Component Value Date   HGBA1C 5.5 10/16/2022      Assessment & Plan:   Problem List Items Addressed This Visit       Nervous and Auditory   Impacted cerumen of left ear   Procedure note: ear wax lavage Verbal consent obtained Left ear irrigated with combination of water  and peroxide Wax successfully removed Pt tolerated well No immediate complication noted       Relevant Orders   Ear Lavage   Acute otitis media    Persistent left ear pain due to incomplete antibiotic course. Augmentin  ordered advised to complete full course of the medication       Relevant Medications   amoxicillin -clavulanate (AUGMENTIN ) 875-125 MG tablet     Other   Current moderate episode of major depressive disorder (HCC)   Flowsheet Row Office Visit from 02/29/2024 in [facility]  PHQ-9 Total Score 13  Increase Prozac  to 40 mg daily continue hydroxyzine  25 mg at bedtime as needed Referral to psychiatrist placed      Relevant Medications   FLUoxetine  (PROZAC ) 40 MG capsule   hydrOXYzine  (VISTARIL ) 25 MG capsule   Other Relevant Orders   Ambulatory referral to Psychiatry   GAD (generalized anxiety disorder)      02/29/2024    2:46 PM 09/24/2023    3:52 PM 05/10/2023   12:05 PM 10/28/2022    1:08 PM  GAD 7  : Generalized Anxiety Score  Nervous, Anxious, on Edge 3 3  3  3    Control/stop worrying 3 3  3  3    Worry too much - different things 3 3  3  3    Trouble relaxing 3 3  3   0   Restless 2 0  0  2   Easily annoyed or irritable 3 3  3  3    Afraid - awful might happen 3 3  3  3    Total GAD 7 Score 20 18 18 17   Anxiety Difficulty Extremely difficult Somewhat difficult Extremely difficult Somewhat difficult     Data saved with a previous flowsheet row definition   Major depressive disorder On Prozac  and hydroxyzine . No current therapist or psychiatrist due to insurance. - Increase Prozac  to 40 mg daily , continue hydroxyzine  at bedtime as needed - New  referral to psychiatrist placed        Relevant Medications   FLUoxetine  (PROZAC ) 40 MG capsule   hydrOXYzine  (VISTARIL ) 25 MG capsule   Other Relevant Orders   Ambulatory referral to Psychiatry   Obesity (BMI 30-39.9) - Primary   Wt Readings from Last 3 Encounters:  02/29/24 217 lb (98.4 kg)  01/22/24 275 lb (124.7 kg)  09/24/23 221 lb 3.2 oz (100.3 kg)   Body mass index is 29.43 kg/m.   Current BMI 29.43. Previous weight loss with Wegovy , discontinued due to insurance. Current insurance may cover. - Attempt to restart Wegovy  if covered by insurance. - Encouraged 30 minutes of moderate to vigorous exercise five days a week. - Advised dietary modifications: 70-80% vegetables and protein, reduce carbohydrates, replace juice and soda with water . - Encouraged three healthy meals per day.      Relevant Medications   semaglutide -weight management (WEGOVY ) 0.25 MG/0.5ML SOAJ SQ injection   Insomnia   Relevant Medications   hydrOXYzine  (VISTARIL ) 25 MG capsule   Loose stools   Three weeks of loose stools with nausea and unintentional weight loss. No prior history. Possible gastroenterology referral if persistent. - Ensure adequate hydration. - Monitor symptoms and consider referral to gastroenterology if diarrhea persists or worsens        Meds ordered this encounter  Medications   FLUoxetine  (PROZAC ) 40 MG capsule    Sig: Take 1 capsule (40 mg total) by mouth daily.    Dispense:  90 capsule    Refill:  3   hydrOXYzine  (VISTARIL ) 25 MG capsule    Sig: Take 1 capsule (25 mg total) by mouth at bedtime as needed.    Dispense:  30 capsule    Refill:  1    DX Code Needed  .   semaglutide -weight management (WEGOVY ) 0.25 MG/0.5ML SOAJ SQ injection    Sig: Inject 0.25 mg into the skin once a week.    Dispense:  2 mL    Refill:  0   amoxicillin -clavulanate (AUGMENTIN ) 875-125 MG tablet    Sig: Take 1 tablet by mouth 2 (two) times daily.    Dispense:  20 tablet    Refill:  0    Follow-up: Return in about 2 months (around 04/28/2024) for CPE.    Kehinde Bowdish R Clavin Ruhlman, FNP     [1] No Known Allergies  "

## 2024-02-29 NOTE — Assessment & Plan Note (Addendum)
 Wt Readings from Last 3 Encounters:  02/29/24 217 lb (98.4 kg)  01/22/24 275 lb (124.7 kg)  09/24/23 221 lb 3.2 oz (100.3 kg)   Body mass index is 29.43 kg/m.   Current BMI 29.43. Previous weight loss with Wegovy , discontinued due to insurance. Current insurance may cover. - Attempt to restart Wegovy  if covered by insurance. - Encouraged 30 minutes of moderate to vigorous exercise five days a week. - Advised dietary modifications: 70-80% vegetables and protein, reduce carbohydrates, replace juice and soda with water . - Encouraged three healthy meals per day.

## 2024-02-29 NOTE — Assessment & Plan Note (Addendum)
 Three weeks of loose stools with nausea and unintentional weight loss. No prior history. Possible gastroenterology referral if persistent. - Ensure adequate hydration. - Monitor symptoms and consider referral to gastroenterology if diarrhea persists or worsens

## 2024-02-29 NOTE — Assessment & Plan Note (Signed)
" °  Persistent left ear pain due to incomplete antibiotic course. Augmentin  ordered advised to complete full course of the medication  "

## 2024-03-03 ENCOUNTER — Telehealth: Payer: Self-pay

## 2024-03-03 NOTE — Telephone Encounter (Signed)
 Pharmacy Patient Advocate Encounter  Received notification from [facility] that Prior Authorization for WEGOVY  has been APPROVED from 03/03/2024 to 03/03/2025   PA #/Case ID/Reference #: [id number]

## 2024-03-16 ENCOUNTER — Ambulatory Visit (HOSPITAL_COMMUNITY)
Admission: EM | Admit: 2024-03-16 | Discharge: 2024-03-16 | Disposition: A | Discharge status: Home / Self Care | Source: Ambulatory Visit | Attending: Family Medicine | Admitting: Family Medicine

## 2024-03-16 ENCOUNTER — Encounter (HOSPITAL_COMMUNITY): Payer: Self-pay

## 2024-03-16 DIAGNOSIS — B349 Viral infection, unspecified: Secondary | ICD-10-CM

## 2024-03-16 LAB — POCT INFLUENZA A/B
Influenza A, POC: NEGATIVE
Influenza B, POC: NEGATIVE

## 2024-03-16 LAB — HIV ANTIBODY (ROUTINE TESTING W REFLEX): HIV Screen 4th Generation wRfx: NONREACTIVE

## 2024-03-16 MED ORDER — ALBUTEROL SULFATE HFA 108 (90 BASE) MCG/ACT IN AERS: 1.0000 | INHALATION_SPRAY | Freq: Four times a day (QID) | RESPIRATORY_TRACT | 0 refills | Status: AC | PRN

## 2024-03-16 MED ORDER — PROMETHAZINE-DM 6.25-15 MG/5ML PO SYRP: 5.0000 mL | ORAL_SOLUTION | Freq: Four times a day (QID) | ORAL | 0 refills | Status: AC | PRN

## 2024-03-16 MED ORDER — CETIRIZINE HCL 10 MG PO TABS: 10.0000 mg | ORAL_TABLET | Freq: Every day | ORAL | 1 refills | Status: AC

## 2024-03-16 MED ORDER — IBUPROFEN 600 MG PO TABS: 600.0000 mg | ORAL_TABLET | Freq: Four times a day (QID) | ORAL | 0 refills | Status: AC | PRN

## 2024-03-16 NOTE — ED Triage Notes (Addendum)
 Pt states fever,fatigue,body aches for the past 2 days.  States he has been taking ibuprofen  at home. Pt states he would also like blood work to test for STD's.  Denies any symptoms.

## 2024-03-16 NOTE — ED Provider Notes (Signed)
 " [facility]    CSN: [id number] Arrival date & time: 03/16/24  1938      History   Chief Complaint Chief Complaint  Patient presents with   Fever    HPI Danny Mccoy is a 39 y.o. male.   39 year old male who states he started having fever and bodyaches on Monday.  Today he took ibuprofen , but he has not taken any other medications. Feels like he has been having trouble breathing occasionally but denies cough or mucus.  Denies sore throat, sinus pain or pressure, mucus or congestion.  He does report feeling hot and then chills this morning, but he did not take his temperature.  He endorses difficulty sleeping last night.  He is on hydroxyzine  which he does take at night for sleep.  Denies sick contacts.  He states he did call out of work today.  The history is provided by the patient.  Fever   Past Medical History:  Diagnosis Date   Anxiety    Asthma    Depression    ESBL (extended spectrum beta-lactamase) producing bacteria infection 10/16/2022   HSV-1 infection    Hypertension    UTI (urinary tract infection) 10/15/2022    Patient Active Problem List   Diagnosis Date Noted   Impacted cerumen of left ear 02/29/2024   Insomnia 02/29/2024   Acute otitis media 02/29/2024   Loose stools 02/29/2024   Anxiety and depression 09/24/2023   Obesity (BMI 30-39.9) 09/24/2023   Penile discharge 09/24/2023   GAD (generalized anxiety disorder) 05/10/2023   Screening examination for STI 05/10/2023   Dysuria 05/10/2023   High risk heterosexual behavior 05/10/2023   Erectile dysfunction 05/10/2023   Cloudy urine 01/11/2023   Hospital discharge follow-up 10/28/2022   Incomplete right bundle branch block 10/28/2022   Current moderate episode of major depressive disorder (HCC) 10/28/2022   Homelessness 10/28/2022   Bilateral hand pain 10/28/2022   Hypocalcemia 10/28/2022   Proteinuria 10/28/2022   ESBL (extended spectrum beta-lactamase) producing bacteria  infection 10/16/2022   UTI (urinary tract infection) 10/15/2022   AKI (acute kidney injury)    Syncope 11/06/2020   Constipation 06/30/2007    Past Surgical History:  Procedure Laterality Date   FRACTURE SURGERY         Home Medications    Prior to Admission medications  Medication Sig Start Date End Date Taking? Authorizing Provider  cetirizine  (ZYRTEC  ALLERGY) 10 MG tablet Take 1 tablet (10 mg total) by mouth daily. 03/16/24  Yes Remi Pippin, NP  ibuprofen  (ADVIL ) 600 MG tablet Take 1 tablet (600 mg total) by mouth every 6 (six) hours as needed. 03/16/24  Yes Remi Pippin, NP  promethazine -dextromethorphan (PROMETHAZINE -DM) 6.25-15 MG/5ML syrup Take 5 mLs by mouth 4 (four) times daily as needed for cough. 03/16/24  Yes Oceane Fosse, Pippin, NP  FLUoxetine  (PROZAC ) 40 MG capsule Take 1 capsule (40 mg total) by mouth daily. 02/29/24   [name], [name], FNP  hydrOXYzine  (VISTARIL ) 25 MG capsule Take 1 capsule (25 mg total) by mouth at bedtime as needed. 02/29/24   [name], [name], FNP  semaglutide -weight management (WEGOVY ) 0.25 MG/0.5ML SOAJ SQ injection Inject 0.25 mg into the skin once a week. 02/29/24   [name], [name], FNP  tadalafil  (CIALIS ) 5 MG tablet Take 1 tablet (5 mg total) by mouth daily. 05/10/23   [name], [name], FNP    Family History Family History  Problem Relation Age of Onset   Healthy Mother    Diabetes Mother  Healthy Father    Diabetes Father    Breast cancer Maternal Grandmother    Prostate cancer Neg Hx    Colon cancer Neg Hx     Social History Social History[1]   Allergies   Patient has no known allergies.   Review of Systems Review of Systems  Constitutional:  Positive for fever.     Physical Exam Triage Vital Signs ED Triage Vitals [03/16/24 1943]  Encounter Vitals Group     BP (!) 144/93     Girls Systolic BP Percentile      Girls Diastolic BP Percentile      Boys Systolic BP Percentile      Boys Diastolic BP Percentile       Pulse Rate 69     Resp 16     Temp 98 F (36.7 C)     Temp Source Oral     SpO2 97 %     Weight      Height      Head Circumference      Peak Flow      Pain Score 0     Pain Loc      Pain Education      Exclude from Growth Chart    No data found.  Updated Vital Signs BP (!) 144/93 (BP Location: Right Arm)   Pulse 69   Temp 98 F (36.7 C) (Oral)   Resp 16   SpO2 97%   Visual Acuity Right Eye Distance:   Left Eye Distance:   Bilateral Distance:    Right Eye Near:   Left Eye Near:    Bilateral Near:     Physical Exam Vitals and nursing note reviewed.  Constitutional:      General: He is not in acute distress.    Appearance: He is well-developed.  HENT:     Head: Normocephalic and atraumatic.  Eyes:     Conjunctiva/sclera: Conjunctivae normal.  Cardiovascular:     Rate and Rhythm: Normal rate and regular rhythm.     Heart sounds: No murmur heard. Pulmonary:     Effort: Pulmonary effort is normal. No respiratory distress.     Breath sounds: Normal breath sounds.     Comments: Cough with deep breath Abdominal:     Palpations: Abdomen is soft.     Tenderness: There is no abdominal tenderness.  Musculoskeletal:        General: No swelling.     Cervical back: Neck supple.  Skin:    General: Skin is warm and dry.     Capillary Refill: Capillary refill takes less than 2 seconds.  Neurological:     Mental Status: He is alert.  Psychiatric:        Mood and Affect: Mood normal.      UC Treatments / Results  Labs (all labs ordered are listed, but only abnormal results are displayed) Labs Reviewed  HIV ANTIBODY (ROUTINE TESTING W REFLEX)  SYPHILIS: RPR W/REFLEX TO RPR TITER AND TREPONEMAL ANTIBODIES, TRADITIONAL SCREENING AND DIAGNOSIS ALGORITHM  POCT INFLUENZA A/B    EKG   Radiology No results found.  Procedures Procedures (including critical care time)  Medications Ordered in UC Medications - No data to display  Initial Impression /  Assessment and Plan / UC Course  I have reviewed the triage vital signs and the nursing notes.  Pertinent labs & imaging results that were available during my care of the patient were reviewed by me and considered in my medical  decision making (see chart for details).  Suspect viral URI, viral syndrome.  Viral testing: Negative  Physical exam findings reassuring, vital signs hemodynamically stable, and lungs clear, therefore deferred imaging of the chest.  Advised supportive care/prescriptions for symptomatic relief as outlined in AVS.        Final Clinical Impressions(s) / UC Diagnoses   Final diagnoses:  Viral illness     Discharge Instructions      We will call you with any abnormal lab results for your STI testing  You have a viral illness which will improve on its own with rest, fluids, and medications to help with your symptoms.  Tylenol , guaifenesin  (plain mucinex ), and saline nasal sprays may help relieve symptoms.   Take Promethazine  DM cough medication to help with your cough at nighttime so that you are able to sleep. Do not drive, drink alcohol, or go to work while taking this medication since it can make you sleepy. Only take this at nighttime.   For chest pain, shortness of breath, inability to keep food or fluids down without vomiting, fever that does not respond to tylenol  or motrin , or any other severe symptoms, please go to the ER for further evaluation. Return to urgent care as needed, otherwise follow-up with PCP.      ED Prescriptions     Medication Sig Dispense Auth. Provider   ibuprofen  (ADVIL ) 600 MG tablet Take 1 tablet (600 mg total) by mouth every 6 (six) hours as needed. 30 tablet Remi Pippin, NP   promethazine -dextromethorphan (PROMETHAZINE -DM) 6.25-15 MG/5ML syrup Take 5 mLs by mouth 4 (four) times daily as needed for cough. 118 mL Remi Pippin, NP   cetirizine  (ZYRTEC  ALLERGY) 10 MG tablet Take 1 tablet (10 mg total) by mouth daily. 30  tablet Remi Pippin, NP      PDMP not reviewed this encounter.     [1]  Social History Tobacco Use   Smoking status: Never   Smokeless tobacco: Never   Tobacco comments:    black and mild 2x/week  Vaping Use   Vaping status: Never Used  Substance Use Topics   Alcohol use: Not Currently    Alcohol/week: 1.0 standard drink of alcohol    Types: 1 Cans of beer per week   Drug use: Never     Remi Pippin, NP 03/16/24 2025  "

## 2024-03-16 NOTE — Discharge Instructions (Signed)
 We will call you with any abnormal lab results for your STI testing  You have a viral illness which will improve on its own with rest, fluids, and medications to help with your symptoms.  Tylenol , guaifenesin  (plain mucinex ), and saline nasal sprays may help relieve symptoms.   Take Promethazine  DM cough medication to help with your cough at nighttime so that you are able to sleep. Do not drive, drink alcohol, or go to work while taking this medication since it can make you sleepy. Only take this at nighttime.   For chest pain, shortness of breath, inability to keep food or fluids down without vomiting, fever that does not respond to tylenol  or motrin , or any other severe symptoms, please go to the ER for further evaluation. Return to urgent care as needed, otherwise follow-up with PCP.

## 2024-03-17 LAB — SYPHILIS: RPR W/REFLEX TO RPR TITER AND TREPONEMAL ANTIBODIES, TRADITIONAL SCREENING AND DIAGNOSIS ALGORITHM: RPR Ser Ql: NONREACTIVE

## 2024-06-28 ENCOUNTER — Encounter: Payer: Self-pay | Admitting: Nurse Practitioner
